# Patient Record
Sex: Male | Born: 1955 | Race: White | Hispanic: No
Health system: Midwestern US, Community
[De-identification: ages and names within clinical notes are randomized; demographics above are authoritative.]

## PROBLEM LIST (undated history)

## (undated) DIAGNOSIS — M1990 Unspecified osteoarthritis, unspecified site: Secondary | ICD-10-CM

## (undated) DIAGNOSIS — F909 Attention-deficit hyperactivity disorder, unspecified type: Secondary | ICD-10-CM

## (undated) DIAGNOSIS — M069 Rheumatoid arthritis, unspecified: Secondary | ICD-10-CM

## (undated) DIAGNOSIS — M545 Low back pain, unspecified: Secondary | ICD-10-CM

## (undated) DIAGNOSIS — G8929 Other chronic pain: Secondary | ICD-10-CM

## (undated) DIAGNOSIS — H9190 Unspecified hearing loss, unspecified ear: Secondary | ICD-10-CM

## (undated) DIAGNOSIS — I10 Essential (primary) hypertension: Secondary | ICD-10-CM

## (undated) DIAGNOSIS — M12811 Other specific arthropathies, not elsewhere classified, right shoulder: Secondary | ICD-10-CM

## (undated) DIAGNOSIS — M797 Fibromyalgia: Secondary | ICD-10-CM

## (undated) DIAGNOSIS — K219 Gastro-esophageal reflux disease without esophagitis: Secondary | ICD-10-CM

## (undated) DIAGNOSIS — M75101 Unspecified rotator cuff tear or rupture of right shoulder, not specified as traumatic: Secondary | ICD-10-CM

## (undated) DIAGNOSIS — R519 Headache, unspecified: Secondary | ICD-10-CM

## (undated) DIAGNOSIS — M542 Cervicalgia: Secondary | ICD-10-CM

## (undated) DIAGNOSIS — Z8719 Personal history of other diseases of the digestive system: Secondary | ICD-10-CM

## (undated) DIAGNOSIS — G542 Cervical root disorders, not elsewhere classified: Secondary | ICD-10-CM

## (undated) DIAGNOSIS — R51 Headache: Secondary | ICD-10-CM

## (undated) DIAGNOSIS — H9319 Tinnitus, unspecified ear: Secondary | ICD-10-CM

## (undated) DIAGNOSIS — M75102 Unspecified rotator cuff tear or rupture of left shoulder, not specified as traumatic: Secondary | ICD-10-CM

## (undated) DIAGNOSIS — F03C18 Unspecified dementia, severe, with other behavioral disturbance (HCC): Principal | ICD-10-CM

## (undated) DIAGNOSIS — F1010 Alcohol abuse, uncomplicated: Principal | ICD-10-CM

## (undated) DIAGNOSIS — G3184 Mild cognitive impairment, so stated: Principal | ICD-10-CM

## (undated) DIAGNOSIS — I959 Hypotension, unspecified: Secondary | ICD-10-CM

## (undated) DIAGNOSIS — Z7689 Persons encountering health services in other specified circumstances: Secondary | ICD-10-CM

## (undated) HISTORY — PX: COLONOSCOPY: SHX174

## (undated) HISTORY — PX: CARPAL TUNNEL RELEASE: SHX101

## (undated) HISTORY — PX: DUPUYTREN CONTRACTURE RELEASE: SHX1478

## (undated) HISTORY — PX: CARDIAC CATHETERIZATION: SHX172

## (undated) HISTORY — DX: Unspecified hearing loss, unspecified ear: H91.90

## (undated) HISTORY — PX: ESOPHAGOGASTRODUODENOSCOPY: SHX1529

## (undated) HISTORY — PX: SHOULDER SURGERY: SHX246

## (undated) HISTORY — DX: Unspecified osteoarthritis, unspecified site: M19.90

## (undated) HISTORY — DX: Tinnitus, unspecified ear: H93.19

## (undated) HISTORY — DX: Cervicalgia: M54.2

## (undated) HISTORY — PX: CERVICAL SPINE SURGERY: SHX589

## (undated) HISTORY — DX: Fibromyalgia: M79.7

## (undated) HISTORY — DX: Low back pain, unspecified: M54.50

---
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
## 2000-11-26 ENCOUNTER — Encounter (HOSPITAL_COMMUNITY): Admission: RE | Admit: 2000-11-26 | Discharge: 2000-12-26 | Payer: Self-pay | Admitting: Rheumatology

## 2001-01-07 ENCOUNTER — Encounter (HOSPITAL_COMMUNITY): Admission: RE | Admit: 2001-01-07 | Discharge: 2001-02-06 | Payer: Self-pay | Admitting: Oncology

## 2001-03-04 ENCOUNTER — Encounter (HOSPITAL_COMMUNITY): Admission: RE | Admit: 2001-03-04 | Discharge: 2001-04-03 | Payer: Self-pay | Admitting: Rheumatology

## 2001-09-09 ENCOUNTER — Encounter (HOSPITAL_COMMUNITY): Admission: RE | Admit: 2001-09-09 | Discharge: 2001-10-09 | Payer: Self-pay | Admitting: Rheumatology

## 2002-02-10 ENCOUNTER — Encounter (HOSPITAL_COMMUNITY): Admission: RE | Admit: 2002-02-10 | Discharge: 2002-03-12 | Payer: Self-pay | Admitting: Rheumatology

## 2002-06-02 ENCOUNTER — Encounter (HOSPITAL_COMMUNITY): Admission: RE | Admit: 2002-06-02 | Discharge: 2002-07-02 | Payer: Self-pay | Admitting: Rheumatology

## 2006-06-04 ENCOUNTER — Encounter (INDEPENDENT_AMBULATORY_CARE_PROVIDER_SITE_OTHER): Payer: Self-pay | Admitting: Specialist

## 2006-06-04 ENCOUNTER — Ambulatory Visit (HOSPITAL_BASED_OUTPATIENT_CLINIC_OR_DEPARTMENT_OTHER): Admission: RE | Admit: 2006-06-04 | Discharge: 2006-06-04 | Payer: Self-pay | Admitting: Orthopedic Surgery

## 2008-06-15 ENCOUNTER — Encounter (INDEPENDENT_AMBULATORY_CARE_PROVIDER_SITE_OTHER): Payer: Self-pay | Admitting: Orthopedic Surgery

## 2008-06-15 ENCOUNTER — Ambulatory Visit (HOSPITAL_BASED_OUTPATIENT_CLINIC_OR_DEPARTMENT_OTHER): Admission: RE | Admit: 2008-06-15 | Discharge: 2008-06-15 | Payer: Self-pay | Admitting: Orthopedic Surgery

## 2010-09-02 LAB — POCT HEMOGLOBIN-HEMACUE: Hemoglobin: 14.6 g/dL (ref 13.0–17.0)

## 2010-10-01 NOTE — Op Note (Signed)
NAMELAAKEA, [name]                 ACCOUNT NO.:  [id number]   MEDICAL RECORD NO.:  [id number]          PATIENT TYPE:  AMB   LOCATION:  DSC                          FACILITY:  [facility]   PHYSICIAN:  Katy Fitch. Sypher, M.D. DATE OF BIRTH:  1956-04-26   DATE OF PROCEDURE:  06/15/2008  DATE OF DISCHARGE:                               OPERATIVE REPORT   PREOPERATIVE DIAGNOSES:  1. Complex severe Dupuytren contracture right palm, right long finger,      right ring finger, and right small finger with extensive metacarpal      phalangeal joint flexion contractures and nodular disease involving      the long ring and ring small web spaces.  2. Right carpal tunnel syndrome.   POSTOPERATIVE DIAGNOSES:  1. Complex severe Dupuytren contracture right palm, right long finger,      right ring finger, and right small finger with extensive metacarpal      phalangeal joint flexion contractures and nodular disease involving      the long ring and ring small web spaces.  2. Right carpal tunnel syndrome.   OPERATION:  1. Resection of complex Dupuytren contracture right palm and small      finger.  2. Resection of Dupuytren contracture right palm, ring finger.  3. Resection of Dupuytren contracture right palm and long finger.  4. Separate incision subfascial right carpal tunnel release.   SURGEON:  Josephine Igo, MD   ASSISTANT:  Annye Rusk, PA-C   ANESTHESIA:  General by LMA.   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  He is status post  reconstructive surgery to the left hand with a good long-term result.  He has background osteoarthrosis.  He developed a progressive Dupuytren  contracture of his right hand limiting his ability to open his palm.  He  also had numbness in the index, long and part of the ring finger.  Electrodiagnostic studies confirmed carpal tunnel  syndrome.   Due to a failure to respond to nonoperative measures, he is brought to  the operating room at this time for resection of his pathologic fascia  and release of the transverse carpal ligament.   Preoperatively, he was advised as to the nature of Dupuytren contracture  and palmar fibromatosis.  He knows this is a relentless process that  will recur in the operated fingers as well as unoperated fingers.  Over  time, he will likely have more contractures more nodules, and more  difficulty.   We will perform a subtotaled fasciectomy and try to prophylactically  remove structures that are transforming at this time.   Preoperatively, questions were invited and answered in detail.  He also  was provided informed consent for release of the transverse carpal  ligament.   PROCEDURE:  [name] is brought to the operating room and placed in  supine position upon the operating table.  Preoperatively, he was  interviewed by Dr. Gypsy Balsam who placed an infraclavicular block for  perioperative anesthesia.  He was brought to room 8, placed in supine  position upon operating table and under Dr. Burnett Corrente direct supervision,  general endotracheal anesthesia induced.  Vancomycin 1 g was  administered as an IV prophylactic antibiotic followed by retained  Betadine scrub and paint of the right upper extremity.  The arm was  exsanguinated with an Esmarch bandage and an arterial tourniquet on the  proximal brachium inflated to 220 mmHg.  The procedure commenced with  planning of extensive Brunner zigzag incisions extending to a carpal  tunnel incision paralleling the thenar crease.  The incisions were taken  with 90 degree angles and island flaps were created at the web spaces  between the long ring and ring small web.  The skin flaps were elevated  sharply with a scalpel dissection.  The areas of pinning were released  with scissor and scalpel dissection.  A meticulous identification of the   superficial palmar arch, all of the common digital vessels, common  digital nerves and proper digital nerves to the long, ring, and small  finger was accomplished followed by meticulous resection of the pre-  tendinous fibers, lateral fascial sheets, natatory ligaments, central  cord and one retrovascular cord in the ring finger.   After all the pathologic fascia was excised, the MP joints could be  hyperextended 15 degrees and the interphalangeal joints fully extended.  With great care, the ulnar artery was identified followed by sounding of  the carpal canal.  The median nerve was noted to be in a very ulnar  position and there was an aberrant thenar muscle that extended into the  hypothenar eminence.  With great care, the thenar muscles were  dissected.  An atypical motor branch piercing the ligament was  identified and a retrograde branch was also noted proximally.  With  great care, the transverse carpal ligament was released.  The nerve was  clearly compressed at the distal margin of the transverse carpal  ligament.  The fascial bands overlying the nerve deep to the superficial  palmar arch were released.   The wound was then irrigated with sterile saline.  The zigzag flaps were  lengthened with V-Y advancement technique followed by repair of the skin  wounds with intradermal 3-0 Prolene at the site of the transverse carpal  ligament incision and corner sutures of 5-0 nylon and multiple  interrupted sutures of 5-0 nylon closing the skin wounds.  Open palm  technique was used and loose closure of the proximal phalangeal segments  of the fingers was accomplished.   The wound was then dressed with Silvadene, Adaptic, sterile gauze,  sterile Webril and a volar plaster splint maintaining the wrist in 5  degrees of dorsiflexion.   There were no apparent complications.  [name] tolerated the surgery  and anesthesia well.  The tourniquet was released.  There was immediate  cap refill  to the fingers and thumb.   For aftercare, he is provided prescriptions for Dilaudid 2mg  q 4 hours  prn pain 30 tablets,  he has Percocet 5 mg 1 to 2 tablets p.o. q.4-6 h.  p.r.n. at home and doxycycline 100 mg one p.o. b.i.d. x5 days as a  prophylactic antibiotic.   He will elevate his hand for 1 week.  I will see him back for followup  in the office in 5-7 days for dressing change.      Katy Fitch Sypher, M.D.  Electronically Signed     RVS/MEDQ  D:  06/15/2008  T:  06/16/2008  Job:  [id number]

## 2010-10-04 NOTE — Consult Note (Signed)
[name], Whitaker NO.:  [id number]   MEDICAL RECORD NO.:  [id number]                   PATIENT TYPE:   LOCATION:                                       FACILITY:   PHYSICIAN:  Aundra Dubin, M.D.            DATE OF BIRTH:   DATE OF CONSULTATION:  DATE OF DISCHARGE:                                   CONSULTATION   CHIEF COMPLAINT:  Fibromyalgia.   HISTORY OF PRESENT ILLNESS:  The patient returns being in a flare.  He has  read parts of three books on fibromyalgia and has two pages of lists of  symptoms that he wants to discuss.  He is hurting everywhere.  He feels  depressed.  He is not sleeping well.  He has headaches.  He is aching across  his low back.  There is numbness and tingling in joints.  He mentions fibro-  fog.  There were numerous other symptoms.  His weight is up 4 pounds.  There are no swollen joints.  There are no fever, rashes.   MEDICATIONS:  1. Ritalin 10 mg t.i.d.  2. Valium 5 mg t.i.d.  3. Vicodin 5/500 daily.  4. Darvocet three daily.  5. Ambien rare.  6. Benadryl 50 mg h.s.  7. Vitamins.   PHYSICAL EXAMINATION:  VITAL SIGNS:  Weight 175 pounds, blood pressure  120/82, respirations 14.  GENERAL:  No distress.  LUNGS:  Clear.  HEART:  Regular.  No murmur.  NECK:  No adenopathy.  EXTREMITIES:  Lower extremities:  No edema.  MUSCULOSKELETAL:  The hands and wrists have no swelling or warmth.  They are  mildly tender.  Elbows, shoulders move with stiffness.  Trigger points  around the shoulder, neck.  Some tenderness but there is no wincing.  Hips,  knees, ankles, and feet have a good range of motion, show no arthritis.   ASSESSMENT/PLAN:  Fibromyalgia.  I also believe he is having a flare because  of depression and insomnia.  He does not use the Ambien each night and I  have asked him to use it regularly with the Benadryl.  He has seen Dr.  Donell Beers in the past and I need his expertise to help direct medicines  that  may be needed for depression and possibly anxiety.  The patient mentioned  that his father has been quite ill during December and there were other  family stressors.  I will continue the Darvocet and Vicodin as above.  He is  not overusing this or abusing it in any way.   He will return in four months.   Time:  40 minutes.  Aundra Dubin, M.D.    WWT/MEDQ  D:  06/02/2002  T:  06/02/2002  Job:  [id number]   cc:   Romeo Apple, M.D.   Archer Asa, M.D.

## 2010-10-04 NOTE — Op Note (Signed)
NAMEHANNAN, [name]                 ACCOUNT NO.:  [id number]   MEDICAL RECORD NO.:  [id number]          PATIENT TYPE:  AMB   LOCATION:  DSC                          FACILITY:  [facility]   PHYSICIAN:  Katy Fitch. Sypher, M.D. DATE OF BIRTH:  1956-02-24   DATE OF PROCEDURE:  06/04/2006  DATE OF DISCHARGE:                               OPERATIVE REPORT   PREOPERATIVE DIAGNOSES:  1. Complex recurrent Dupuytren's contracture, left hand, involving      palm to long, ring and small fingers, with extensive nodular      disease in the small and ring fingers.  2. Left carpal tunnel syndrome.   POSTOPERATIVE DIAGNOSES:  1. Complex recurrent Dupuytren's contracture, left hand, involving      palm to long, ring and small fingers, with extensive nodular      disease in the small and ring fingers.  2. Left carpal tunnel syndrome.   OPERATION:  1. Resection of complex recurrent Dupuytren's contracture, left palm      and small finger.  2. Resection of complex recurrent Dupuytren's contracture, left palm      and ring finger.  3. Resection of Dupuytren's contracture, left palm pretendinous fibers      to long finger.  4. Through separate subfascial incision, release of left transverse      carpal ligament.   OPERATIONS:  Katy Fitch. Sypher, M.D.   ASSISTANT:  Molly Maduro Dasnoit PA-C.   ANESTHESIA:  General by LMA, supplemented by a left infraclavicular  block, supervising anesthesiologist is Dr. Sampson Goon.   INDICATIONS:  [name] is a 55 year old self-employed [employer]or[employer] who  was referred by his relative [name], D.D.S., for evaluation and  management of a complex recurrent left Dupuytren's contracture and left  hand numbness.   [name] had had surgery to his left hand in [facility] a year  prior and had immediate recurrence of a severe complex Dupuytren's  contracture.   He sought a hand surgery consult.   Clinical examination revealed extensive nodular recurrent disease  involving the pretendinous fibers of the palm to the long, ring and  small fingers and extensive nodular disease in the ring and small  fingers extending to the middle phalangeal segments.  In addition, Mr.  Whitaker had hand numbness and was referred to see Dr. Johna Roles for  electrodiagnostic studies, which confirmed left carpal tunnel syndrome.   After a lengthy informed consent, he is brought to the operating room at  this time.   Preoperatively, [name] was carefully advised that we could not cure  Dupuytren's disease with surgery.  We can palliate his contractures by  removal of the pathologic fascia.  In addition, he knows that he is at  greater risk for neurovascular injury due to the redo nature of the  surgery with scar from the prior surgeon's efforts.   After lengthy informed consents during which questions were invited and  answered with [name] and his family, he is brought to the operating  room at this time.   Dr. Sampson Goon placed an infraclavicular block in the holding area  leading satisfactory anesthesia of the left upper extremity.   PROCEDURE:  [name] was brought to the operating room and placed in  supine position on the operating table.  Due to some issues with  tourniquet comfort, Dr. Sampson Goon, ultimately decided to proceed with  general anesthesia by LMA.   The left arm was prepped with Betadine soap and solution and sterilely  draped.  Vancomycin 500 mg was administered as an IV prophylactic  antibiotic due to a PENICILLIN allergy.   The procedure commenced with planning of extensor Brunner zigzag  incisions with a [employer] between the ring and small fingers.  The  skin flaps were taken sharply down to the level of the transverse carpal  ligament and elevated between the dermis and the pathologic fascia.  A  meticulous dissection for 1 hour 57 minutes under tourniquet ischemia  was completed with removal of all of the pathologic fascia in the  palm  including the pretendinous fibers of the long, ring and small fingers,  and removal of extensive postoperative scar in the line of the ring and  small fingers and involvement of extensive lateral fascial sheet disease  on the ulnar aspect of the small finger, natatory ligament disease  between the ring and small and long and ring fingers, a large fascial  sheet on the radial lateral aspect of the ring finger, extensive central  core disease in the retrovascular cord on the ulnar aspect of the small  finger, a spiral band and a retrovascular band in the ring finger, and a  large central cord in the ring finger.   After removal of all the pathologic-appearing fascia, the flexion  contracture of the long, ring and small finger MP joints was fully  relieved and the PIP flexion contractures were likewise relieved in the  ring and small fingers.   Care was taken to meticulously dissect out the named vascular and  neurologic structures.  Several cutaneous branches were sacrificed due  to the severe subdermal involvement of the disease.   After completion of the Dupuytren's resection, we attended to the carpal  tunnel by creating a separate fascial incision proximal to the  superficial palmar arch, followed by identification of the transverse  carpal ligament and subcutaneous release of the transverse carpal  ligament into the distal forearm.   This widely opened the carpal canal.  No masses or other predicaments  were noted.  Bleeding points along the margin of the released ligament  were electrocauterized with bipolar current, followed by release of the  tourniquet.  The wounds were then placed under compression with cold  saline sponges for 10 minutes, followed by meticulous hemostasis.  The  skin flaps were then advanced with V-Y advancement flap technique to lengthen the skin of the small finger and palm.  The skin edges were  inset with corner sutures of 5-0 nylon and multiple  horizontal and  mattress and simple sutures of 5-0 nylon.   [name] tolerated the surgery and anesthesia well.  He was  subsequently awakened from general anesthesia and transferred to the  recovery room with stable vital signs.   For aftercare he is provided Dilaudid 2 mg one or two tablets p.o. q4.-  6h. p.r.n. pain, 30 tablets without refill; also doxycycline 100 mg one  p.o. b.i.d. x5 days as a prophylactic antibiotic.      Katy Fitch Sypher, M.D.  Electronically Signed     RVS/MEDQ  D:  06/04/2006  T:  06/04/2006  Job:  [id number]   cc:   [name], D.D.S.

## 2010-10-04 NOTE — Consult Note (Signed)
[facility]  Patient:    [name], [name] Visit Number: [id number] MRN: [id number]          Service Type: RHE Location: [facility] Attending Physician:  Aundra Dubin Dictated by:   Aundra Dubin, M.D. Proc. Date: 03/04/01 Admit Date:  03/04/2001   CC:         Mliss Fritz, M.D.             Archer Asa, M.D.                          Consultation Report  CHIEF COMPLAINT:  Pain.  HISTORY:  Mr. Ransome returns with his wife.  He has found that the Effexor has helped a slight degree reduce some of the overall pain.  He feels that he has a little bit better attitude.  Discusses in his usual fashion in great detail about the various areas of discomfort he has had.  He has also experienced that with riding his motorcycle he did not feel comfortable because his mind was not focused on this.  He limited his riding.  He continues to hurt all over.  There has been some numbness and coolness to his index and third right fingers.  His weight is stable and unchanged.  There has been no rashes.  He uses Metamucil fairly frequently to help with constipation.  He does have headaches occasionally.  MEDICATIONS:  1. Effexor 75 mg q.d.  2. Ritalin 10 mg t.i.d.  3. Valium 5 mg t.i.d.-q.i.d. p.r.n.  4. Darvocet-N 100 t.i.d.  5. Benadryl 50 mg q.h.s.  6. Aspirin 81 mg q.d.  7. Metamucil.  8. Pentanolol drops q.d.  9. Allergy shots. 10. Vicodin rare.  PHYSICAL EXAMINATION  VITAL SIGNS:  Weight 174 pounds, blood pressure 144/90, respirations 16.  GENERAL:  He appears well and unchanged from prior visits.  SKIN:  Clear.  LUNGS:  Clear.  HEART:  Regular.  No murmur.  MUSCULOSKELETAL:  The hands, wrists, elbows, shoulders, and neck:  Good range of motion with minor neck stiffness.  Trigger points at the elbow, shoulder, around the neck, and upper paraspinous muscles were moderately tender with some moderate wincing.  Low back was particularly tender  with wincing.  Hips, knees, ankles, and feet had a painless full range of motion and showed no synovitis.  NEUROLOGIC:  Strength 5/5.  DTRs 2+ throughout.  Negative SLRs.  ASSESSMENT AND PLAN:  Polyarthralgia.  As I have discussed with Mr. and Mrs. Winsett today, I am not finding focal abnormalities.  I believe he is on a moderately significant number of medicines, some of which act in opposite ways.  Specifically, he is on Ritalin and Valium.  Mrs. Pulis says that this combination at one point "saved our marriage."  I do not feel versed in changing these medicines.  We also discussed that he has significant elements of a fibromyalgia type pain syndrome.  I have reviewed the symptomatology of this.  He brings information that he has gathered from the internet on this also.  I have discussed with him that I believe we need the help of a psychiatrist to help further adjust his medicines and understand what is going on with him. I am continuing him on the Darvocet at a t.i.d. dose and the Effexor 75 mg q.d.  Dr. Donell Beers may feel that all these medicines are entirely appropriate, or may decide to adjust these medicines.  I have expressed my  concerns that he has had some mental clouding and greatly encourage him to not ride his motorcycle if this is occurring.  I will see him back in three months. Dictated by:   Aundra Dubin, M.D. Attending Physician:  Aundra Dubin DD:  03/04/01 TD:  03/04/01 Job: [id number]

## 2010-10-04 NOTE — Consult Note (Signed)
NAMEPRESTEN, JOOST                             ACCOUNT NO.:  [id number]   MEDICAL RECORD NO.:  [id number]                   PATIENT TYPE:   LOCATION:                                       FACILITY:   PHYSICIAN:  Aundra Dubin, M.D.            DATE OF BIRTH:   DATE OF CONSULTATION:  02/10/2002  DATE OF DISCHARGE:                                   CONSULTATION   CHIEF COMPLAINT:  Back pain.   HISTORY OF PRESENT ILLNESS:  Mr. Ditullio returns with his wife, reporting that  he has had ups and downs through this summer.  He finds that he will worsen  with rainy, damp weather.  He has had no swollen joints.  He has had some  contractures to the right palmar hand.  He has had a spell in August that he  was aching in his anterior shins.  This sounds like shin splints.  He iced  these areas and has gradually improved.  His weight is down seven pounds.  He is trying to lose some weight and is paying attention to his diet.  He  has had some headaches.  His sleep is generally on fair.   MEDICATIONS:  1. Darvocet t.i.d.  2. Ritalin 10 mg t.i.d.  3. Valium 5 mg t.i.d.  4. Benadryl 50 mg rare.  5. Aspirin 81 mg q.d.  6. Multivitamin q.d.  7. Allergy shots q. week.  8. Ambien 10 mg h.s.   PHYSICAL EXAMINATION:  VITAL SIGNS:  Weight 171 pounds, blood pressure  124/80, respirations 18.  GENERAL:  No distress.  LUNGS:  Clear.  HEART:  Regular, no murmur.  NECK:  Negative JVD.  MUSCULOSKELETAL:  The hands have a vague arthritic appearance.  He appears  to have some Dupuytren's contractures forming to the fourth tendon area of  the right hand.  The hands were nontender.  Wrists, elbows, and shoulders  all had a good range of motion and there was no resistance.  Trigger points  around the shoulder, neck, occiput, and anterior chest were mildly tender.  Knees, ankles, and feet all had a good range of motion and there was no  guarding with any of this movement.  Palpation along the shins  was  nontender.   ASSESSMENT/PLAN:  1. Back and shoulder pain.  Overall, Mr. Mazer is stable.  He does discuss     that he feels that he is doing as well as he has been in a long time.  He     will continue with the t.i.d. Darvocet.  He tells me he has one refill     left on the Vicodin and is using a small amount of this medicine.  He     will continue with his other medicines to include Valium, Ritalin, and     Ambien at night.  2. He will return in four months.  Time 25 minutes.                                                 Aundra Dubin, M.D.    WWT/MEDQ  D:  02/10/2002  T:  02/11/2002  Job:  [id number]   cc:   Kinnie Scales Plomaritis

## 2010-10-04 NOTE — Consult Note (Signed)
[facility]  Patient:    [name], [name] Visit Number: [id number] MRN: [id number]          Service Type: RHE Location: [facility] Attending Physician:  Aundra Dubin Dictated by:   Nathaneil Canary, M.D. Proc. Date: 09/09/01 Admit Date:  09/09/2001                            Consultation Report  CHIEF COMPLAINT:  Back pain, shoulder pain.  HISTORY:  Mr. ______ did see Dr. Donell Beers in consultation on June 27, 2001. Dr. Donell Beers felt that the combination of the Ritalin and Valium was okay.  He did suggest possibly using nortriptyline.  Mr. ______ says today that he is intolerant to this medicine.  He has had surgery to the right shoulder in January 2003 with Dr. ______ and is feeling significantly better.  He says he is feeling as well as he has in many years.  MEDICATIONS: 1. Vioxx 25 mg q.d. 2. Ritalin 10 mg t.i.d. 3. Valium 5 mg t.i.d. 4. Darvocet one t.i.d. 5. Benadryl 50 mg h.s. p.rn.. 6. ASA 81 mg q.d. 7. Metamucil q.d. 8. Vicodin about one q.d.  PHYSICAL EXAMINATION  VITAL SIGNS:  Weight 178 pounds, blood pressure 116/78, respirations 16.  GENERAL:  No distress.  MUSCULOSKELETAL:  Hands and wrists are slightly tender but they are nonswollen and have a good range of motion.  Elbows, shoulders:  Good range of motion, especially the right shoulder.  It was mildly stiff.  Trigger points at the elbows, shoulders, neck occiput, upper paraspinous muscles, anterior chest was met with slight complaint of discomfort, but no wincing or grimacing.  There was no guarding with moving to a lying and sitting position.  Hips, knees, ankles, feet had a good range of motion.  He had negative SLRs.  ASSESSMENT AND PLAN:  Back and shoulder pain.  He is actually significantly improved with the right shoulder at this time.  However, he still complains that his low back, upper back, and shoulders are the worse areas.  This is probably not a fibromyalgia syndrome,  but more of a pain disorder.  I have consented to allowing him to use three Darvocet q.d. and one Vicodin q.d.  I have asked him to not obtain any narcotics from any other physician.  I have discussed the difficulties with treating pain with narcotics and my preference is to have a pain center do this.  However, we will try this at this point.  He will return in four months. Dictated by:   Nathaneil Canary, M.D. Attending Physician:  Aundra Dubin DD:  09/09/01 TD:  09/09/01 Job: [id number]

## 2011-05-20 DIAGNOSIS — M75102 Unspecified rotator cuff tear or rupture of left shoulder, not specified as traumatic: Secondary | ICD-10-CM

## 2011-05-20 HISTORY — DX: Unspecified rotator cuff tear or rupture of left shoulder, not specified as traumatic: M75.102

## 2011-06-05 ENCOUNTER — Other Ambulatory Visit: Payer: Self-pay | Admitting: Orthopedic Surgery

## 2011-06-05 ENCOUNTER — Encounter (HOSPITAL_BASED_OUTPATIENT_CLINIC_OR_DEPARTMENT_OTHER): Payer: Self-pay | Admitting: *Deleted

## 2011-06-05 NOTE — Pre-Procedure Instructions (Signed)
States has nicks/cuts both hands

## 2011-06-10 ENCOUNTER — Ambulatory Visit (HOSPITAL_BASED_OUTPATIENT_CLINIC_OR_DEPARTMENT_OTHER)
Admission: RE | Admit: 2011-06-10 | Discharge: 2011-06-11 | Disposition: A | Discharge status: Home / Self Care | Payer: BC Managed Care – PPO | Source: Ambulatory Visit | Attending: Orthopedic Surgery | Admitting: Orthopedic Surgery

## 2011-06-10 ENCOUNTER — Encounter (HOSPITAL_BASED_OUTPATIENT_CLINIC_OR_DEPARTMENT_OTHER): Payer: Self-pay | Admitting: *Deleted

## 2011-06-10 ENCOUNTER — Encounter (HOSPITAL_BASED_OUTPATIENT_CLINIC_OR_DEPARTMENT_OTHER)
Admission: RE | Disposition: A | Discharge status: Home / Self Care | Payer: Self-pay | Source: Ambulatory Visit | Attending: Orthopedic Surgery

## 2011-06-10 ENCOUNTER — Encounter (HOSPITAL_BASED_OUTPATIENT_CLINIC_OR_DEPARTMENT_OTHER): Payer: Self-pay | Admitting: Certified Registered"

## 2011-06-10 ENCOUNTER — Ambulatory Visit (HOSPITAL_BASED_OUTPATIENT_CLINIC_OR_DEPARTMENT_OTHER): Payer: BC Managed Care – PPO | Admitting: Certified Registered"

## 2011-06-10 DIAGNOSIS — Z5333 Arthroscopic surgical procedure converted to open procedure: Secondary | ICD-10-CM | POA: Insufficient documentation

## 2011-06-10 DIAGNOSIS — M67919 Unspecified disorder of synovium and tendon, unspecified shoulder: Secondary | ICD-10-CM | POA: Insufficient documentation

## 2011-06-10 DIAGNOSIS — M24119 Other articular cartilage disorders, unspecified shoulder: Secondary | ICD-10-CM | POA: Insufficient documentation

## 2011-06-10 DIAGNOSIS — M19019 Primary osteoarthritis, unspecified shoulder: Secondary | ICD-10-CM | POA: Insufficient documentation

## 2011-06-10 DIAGNOSIS — M069 Rheumatoid arthritis, unspecified: Secondary | ICD-10-CM | POA: Insufficient documentation

## 2011-06-10 DIAGNOSIS — M719 Bursopathy, unspecified: Secondary | ICD-10-CM | POA: Insufficient documentation

## 2011-06-10 DIAGNOSIS — F909 Attention-deficit hyperactivity disorder, unspecified type: Secondary | ICD-10-CM | POA: Insufficient documentation

## 2011-06-10 DIAGNOSIS — K219 Gastro-esophageal reflux disease without esophagitis: Secondary | ICD-10-CM | POA: Insufficient documentation

## 2011-06-10 HISTORY — DX: Low back pain, unspecified: M54.50

## 2011-06-10 HISTORY — DX: Attention-deficit hyperactivity disorder, unspecified type: F90.9

## 2011-06-10 HISTORY — DX: Low back pain: M54.5

## 2011-06-10 HISTORY — DX: Rheumatoid arthritis, unspecified: M06.9

## 2011-06-10 HISTORY — DX: Gastro-esophageal reflux disease without esophagitis: K21.9

## 2011-06-10 HISTORY — DX: Other chronic pain: G89.29

## 2011-06-10 HISTORY — DX: Unspecified rotator cuff tear or rupture of left shoulder, not specified as traumatic: M75.102

## 2011-06-10 HISTORY — DX: Cervical root disorders, not elsewhere classified: G54.2

## 2011-06-10 HISTORY — DX: Unspecified osteoarthritis, unspecified site: M19.90

## 2011-06-10 SURGERY — ARTHROSCOPY, SHOULDER, WITH ROTATOR CUFF REPAIR
Anesthesia: Regional | Site: Shoulder | Laterality: Left | Wound class: Clean

## 2011-06-10 MED ORDER — HYDROMORPHONE HCL 2 MG PO TABS: ORAL_TABLET | ORAL | Status: AC

## 2011-06-10 MED ORDER — LIDOCAINE HCL (CARDIAC) 20 MG/ML IV SOLN
INTRAVENOUS | Status: DC | PRN
  Administered 2011-06-10: 80 mg via INTRAVENOUS

## 2011-06-10 MED ORDER — IBUPROFEN 600 MG PO TABS: 600.0000 mg | ORAL_TABLET | Freq: Four times a day (QID) | ORAL | Status: AC | PRN

## 2011-06-10 MED ORDER — SUCCINYLCHOLINE CHLORIDE 20 MG/ML IJ SOLN
INTRAMUSCULAR | Status: DC | PRN
  Administered 2011-06-10: 120 mg via INTRAVENOUS

## 2011-06-10 MED ORDER — LACTATED RINGERS IV SOLN
INTRAVENOUS | Status: DC
  Administered 2011-06-10: 100 mL/h via INTRAVENOUS
  Administered 2011-06-10 (×2): via INTRAVENOUS

## 2011-06-10 MED ORDER — METOCLOPRAMIDE HCL 5 MG PO TABS: 5.0000 mg | ORAL_TABLET | Freq: Three times a day (TID) | ORAL | Status: DC | PRN

## 2011-06-10 MED ORDER — FENTANYL CITRATE 0.05 MG/ML IJ SOLN
50.0000 ug | INTRAMUSCULAR | Status: DC | PRN
  Administered 2011-06-10: 100 ug via INTRAVENOUS

## 2011-06-10 MED ORDER — SODIUM CHLORIDE 0.9 % IR SOLN
Status: DC | PRN
  Administered 2011-06-10: 7000 mL

## 2011-06-10 MED ORDER — OXYCODONE-ACETAMINOPHEN 5-325 MG PO TABS
1.0000 | ORAL_TABLET | ORAL | Status: DC | PRN
  Administered 2011-06-11: 2 via ORAL

## 2011-06-10 MED ORDER — ONDANSETRON HCL 4 MG/2ML IJ SOLN: 4.0000 mg | Freq: Four times a day (QID) | INTRAMUSCULAR | Status: DC | PRN

## 2011-06-10 MED ORDER — DOXYCYCLINE HYCLATE 100 MG PO TABS: 100.0000 mg | ORAL_TABLET | Freq: Two times a day (BID) | ORAL | Status: AC

## 2011-06-10 MED ORDER — DEXTROSE-NACL 5-0.45 % IV SOLN
INTRAVENOUS | Status: DC
  Administered 2011-06-10: 17:00:00 via INTRAVENOUS

## 2011-06-10 MED ORDER — PROPOFOL 10 MG/ML IV EMUL
INTRAVENOUS | Status: DC | PRN
  Administered 2011-06-10: 200 mg via INTRAVENOUS

## 2011-06-10 MED ORDER — HYDROMORPHONE HCL PF 1 MG/ML IJ SOLN
0.2500 mg | INTRAMUSCULAR | Status: DC | PRN
  Administered 2011-06-10 (×2): 0.5 mg via INTRAVENOUS

## 2011-06-10 MED ORDER — HYDROMORPHONE HCL 2 MG PO TABS
2.0000 mg | ORAL_TABLET | ORAL | Status: DC | PRN
  Administered 2011-06-10: 4 mg via ORAL
  Administered 2011-06-10 – 2011-06-11 (×3): 2 mg via ORAL

## 2011-06-10 MED ORDER — VANCOMYCIN HCL IN DEXTROSE 1-5 GM/200ML-% IV SOLN
1000.0000 mg | Freq: Once | INTRAVENOUS | Status: AC
  Administered 2011-06-11: 1000 mg via INTRAVENOUS

## 2011-06-10 MED ORDER — ROPIVACAINE HCL 5 MG/ML IJ SOLN
INTRAMUSCULAR | Status: DC | PRN
  Administered 2011-06-10: 30 mL via EPIDURAL

## 2011-06-10 MED ORDER — CHLORHEXIDINE GLUCONATE 4 % EX LIQD: 60.0000 mL | Freq: Once | CUTANEOUS | Status: DC

## 2011-06-10 MED ORDER — VANCOMYCIN HCL IN DEXTROSE 1-5 GM/200ML-% IV SOLN
1000.0000 mg | Freq: Once | INTRAVENOUS | Status: AC
  Administered 2011-06-10: 1000 mg via INTRAVENOUS

## 2011-06-10 MED ORDER — METOCLOPRAMIDE HCL 5 MG/ML IJ SOLN: 5.0000 mg | Freq: Three times a day (TID) | INTRAMUSCULAR | Status: DC | PRN

## 2011-06-10 MED ORDER — FENTANYL CITRATE 0.05 MG/ML IJ SOLN
INTRAMUSCULAR | Status: DC | PRN
  Administered 2011-06-10 (×2): 25 ug via INTRAVENOUS
  Administered 2011-06-10: 50 ug via INTRAVENOUS
  Administered 2011-06-10: 25 ug via INTRAVENOUS
  Administered 2011-06-10: 50 ug via INTRAVENOUS
  Administered 2011-06-10: 25 ug via INTRAVENOUS

## 2011-06-10 MED ORDER — DEXAMETHASONE SODIUM PHOSPHATE 4 MG/ML IJ SOLN
INTRAMUSCULAR | Status: DC | PRN
  Administered 2011-06-10: 10 mg via INTRAVENOUS

## 2011-06-10 MED ORDER — ONDANSETRON HCL 4 MG/2ML IJ SOLN
INTRAMUSCULAR | Status: DC | PRN
  Administered 2011-06-10: 4 mg via INTRAVENOUS

## 2011-06-10 MED ORDER — HYDROMORPHONE HCL PF 1 MG/ML IJ SOLN
0.5000 mg | INTRAMUSCULAR | Status: DC | PRN
  Administered 2011-06-10 (×2): 0.5 mg via INTRAVENOUS
  Administered 2011-06-11: 1 mg via INTRAVENOUS

## 2011-06-10 MED ORDER — MIDAZOLAM HCL 2 MG/2ML IJ SOLN
1.0000 mg | INTRAMUSCULAR | Status: DC | PRN
  Administered 2011-06-10: 2 mg via INTRAVENOUS

## 2011-06-10 SURGICAL SUPPLY — 87 items
ANCH SUT SWLK 19.1 CLS EYLT TL (Anchor) ×1 IMPLANT
ANCH SUT SWLK 19.1 CLS EYLT VT (Anchor) ×1 IMPLANT
ANCH SUT SWLK 19.1X4.75 (Anchor) ×4 IMPLANT
ANCHOR BIO SWLOCK 4.75 W/TIG (Anchor) ×1 IMPLANT
ANCHOR SUT BIO SW 4.75 W/FIB (Anchor) ×1 IMPLANT
ANCHOR SUT BIO SW 4.75X19.1 (Anchor) ×4 IMPLANT
BANDAGE ADHESIVE 1X3 (GAUZE/BANDAGES/DRESSINGS) IMPLANT
BLADE AVERAGE 25X9 (BLADE) IMPLANT
BLADE CUTTER MENIS 5.5 (BLADE) ×1 IMPLANT
BLADE SURG 15 STRL LF DISP TIS (BLADE) ×2 IMPLANT
BLADE SURG 15 STRL SS (BLADE) ×4
BLADE VORTEX 6.0 (BLADE) ×1 IMPLANT
BUR EGG/OVAL CARBIDE (BURR) ×1 IMPLANT
BUR OVAL 6.0 (BURR) ×1 IMPLANT
CANISTER OMNI JUG 16 LITER (MISCELLANEOUS) ×2 IMPLANT
CANISTER SUCTION 2500CC (MISCELLANEOUS) ×1 IMPLANT
CANNULA 5.75X7 CRYSTAL CLEAR (CANNULA) IMPLANT
CANNULA SHOULDER 7CM (CANNULA) IMPLANT
CANNULA TWIST IN 8.25X7CM (CANNULA) ×1 IMPLANT
CLEANER CAUTERY TIP 5X5 PAD (MISCELLANEOUS) IMPLANT
CLOTH BEACON ORANGE TIMEOUT ST (SAFETY) ×2 IMPLANT
CUTTER MENISCUS  4.2MM (BLADE) ×1
CUTTER MENISCUS 4.2MM (BLADE) ×1 IMPLANT
DECANTER SPIKE VIAL GLASS SM (MISCELLANEOUS) IMPLANT
DRAPE INCISE IOBAN 66X45 STRL (DRAPES) ×2 IMPLANT
DRAPE STERI 35X30 U-POUCH (DRAPES) ×2 IMPLANT
DRAPE SURG 17X23 STRL (DRAPES) ×1 IMPLANT
DRAPE U-SHAPE 47X51 STRL (DRAPES) ×2 IMPLANT
DRAPE U-SHAPE 76X120 STRL (DRAPES) ×4 IMPLANT
DRSG PAD ABDOMINAL 8X10 ST (GAUZE/BANDAGES/DRESSINGS) ×2 IMPLANT
DURAPREP 26ML APPLICATOR (WOUND CARE) ×2 IMPLANT
ELECT REM PT RETURN 9FT ADLT (ELECTROSURGICAL) ×2
ELECTRODE REM PT RTRN 9FT ADLT (ELECTROSURGICAL) IMPLANT
GLOVE BIO SURGEON STRL SZ 6.5 (GLOVE) ×2 IMPLANT
GLOVE BIOGEL M STRL SZ7.5 (GLOVE) ×2 IMPLANT
GLOVE BIOGEL PI IND STRL 7.0 (GLOVE) IMPLANT
GLOVE BIOGEL PI IND STRL 8 (GLOVE) ×2 IMPLANT
GLOVE BIOGEL PI INDICATOR 7.0 (GLOVE) ×2
GLOVE BIOGEL PI INDICATOR 8 (GLOVE) ×2
GLOVE ORTHO TXT STRL SZ7.5 (GLOVE) ×2 IMPLANT
GOWN BRE IMP PREV XXLGXLNG (GOWN DISPOSABLE) ×1 IMPLANT
GOWN PREVENTION PLUS XLARGE (GOWN DISPOSABLE) ×3 IMPLANT
GOWN STRL REIN 2XL XLG LVL4 (GOWN DISPOSABLE) ×1 IMPLANT
NDL SCORPION (NEEDLE) ×1 IMPLANT
NDL SUT 6 .5 CRC .975X.05 MAYO (NEEDLE) IMPLANT
NEEDLE MAYO TAPER (NEEDLE)
NEEDLE MINI RC 24MM (NEEDLE) IMPLANT
NEEDLE SCORPION (NEEDLE) ×2 IMPLANT
PACK ARTHROSCOPY DSU (CUSTOM PROCEDURE TRAY) ×2 IMPLANT
PACK BASIN DAY SURGERY FS (CUSTOM PROCEDURE TRAY) ×2 IMPLANT
PAD CLEANER CAUTERY TIP 5X5 (MISCELLANEOUS)
PASSER SUT SWANSON 36MM LOOP (INSTRUMENTS) IMPLANT
PENCIL BUTTON HOLSTER BLD 10FT (ELECTRODE) ×1 IMPLANT
SLEEVE SCD COMPRESS KNEE MED (MISCELLANEOUS) ×2 IMPLANT
SLING ARM FOAM STRAP LRG (SOFTGOODS) ×1 IMPLANT
SLING ARM FOAM STRAP MED (SOFTGOODS) IMPLANT
SPONGE GAUZE 4X4 12PLY (GAUZE/BANDAGES/DRESSINGS) ×2 IMPLANT
SPONGE LAP 4X18 X RAY DECT (DISPOSABLE) ×2 IMPLANT
STRIP CLOSURE SKIN 1/2X4 (GAUZE/BANDAGES/DRESSINGS) ×3 IMPLANT
SUCTION FRAZIER TIP 10 FR DISP (SUCTIONS) IMPLANT
SUT ETHIBOND 2 OS 4 DA (SUTURE) IMPLANT
SUT ETHILON 4 0 PS 2 18 (SUTURE) IMPLANT
SUT FIBERWIRE #2 38 T-5 BLUE (SUTURE) ×2
SUT FIBERWIRE 3-0 18 TAPR NDL (SUTURE)
SUT PROLENE 1 CT (SUTURE) IMPLANT
SUT PROLENE 3 0 PS 2 (SUTURE) ×2 IMPLANT
SUT TIGER TAPE 7 IN WHITE (SUTURE) IMPLANT
SUT VIC AB 0 CT1 27 (SUTURE)
SUT VIC AB 0 CT1 27XBRD ANBCTR (SUTURE) IMPLANT
SUT VIC AB 0 SH 27 (SUTURE) ×1 IMPLANT
SUT VIC AB 2-0 SH 27 (SUTURE) ×2
SUT VIC AB 2-0 SH 27XBRD (SUTURE) IMPLANT
SUT VIC AB 3-0 SH 27 (SUTURE)
SUT VIC AB 3-0 SH 27X BRD (SUTURE) IMPLANT
SUT VIC AB 3-0 X1 27 (SUTURE) IMPLANT
SUTURE FIBERWR #2 38 T-5 BLUE (SUTURE) IMPLANT
SUTURE FIBERWR 3-0 18 TAPR NDL (SUTURE) IMPLANT
SYR 3ML 23GX1 SAFETY (SYRINGE) IMPLANT
SYR BULB 3OZ (MISCELLANEOUS) IMPLANT
TAPE FIBER 2MM 7IN #2 BLUE (SUTURE) ×1 IMPLANT
TAPE PAPER 3X10 WHT MICROPORE (GAUZE/BANDAGES/DRESSINGS) ×2 IMPLANT
TOWEL OR 17X24 6PK STRL BLUE (TOWEL DISPOSABLE) ×2 IMPLANT
TUBE CONNECTING 20X1/4 (TUBING) ×4 IMPLANT
TUBING ARTHROSCOPY IRRIG 16FT (MISCELLANEOUS) ×2 IMPLANT
WAND STAR VAC 90 (SURGICAL WAND) ×2 IMPLANT
WATER STERILE IRR 1000ML POUR (IV SOLUTION) ×2 IMPLANT
YANKAUER SUCT BULB TIP NO VENT (SUCTIONS) IMPLANT

## 2011-06-10 NOTE — Anesthesia Procedure Notes (Addendum)
Anesthesia Regional Block:  Interscalene brachial plexus block  Pre-Anesthetic Checklist: ,, timeout performed, Correct Patient, Correct Site, Correct Laterality, Correct Procedure, Correct Position, site marked, Risks and benefits discussed,  Surgical consent,  Pre-op evaluation,  At surgeon's request and post-op pain management  Laterality: Left  Prep: chloraprep       Needles:  Injection technique: Single-shot  Needle Type: Echogenic Stimulator Needle     Needle Length: 5cm 5 cm Needle Gauge: 22 and 22 G    Additional Needles:  Procedures: ultrasound guided and nerve stimulator Interscalene brachial plexus block  Nerve Stimulator or Paresthesia:  Response: biceps flexion, 0.45 mA,   Additional Responses:   Narrative:  Start time: 06/10/2011 11:50 AM End time: 06/10/2011 12:00 PM Injection made incrementally with aspirations every 5 mL.  Performed by: Personally  Anesthesiologist: Dr Chaney Malling  Additional Notes: Functioning IV was confirmed and monitors were applied.  A 50mm 22ga Arrow echogenic stimulator needle was used. Sterile prep and drape,hand hygiene and sterile gloves were used.  Negative aspiration and negative test dose prior to incremental administration of local anesthetic. The patient tolerated the procedure well.  Ultrasound guidance: relevent anatomy identified, needle position confirmed, local anesthetic spread visualized around nerve(s), vascular puncture avoided.  Image printed for medical record.   Interscalene brachial plexus block Procedure Name: Intubation Date/Time: 06/10/2011 12:53 PM Performed by: Radford Pax Pre-anesthesia Checklist: Patient identified, Emergency Drugs available, Suction available, Patient being monitored and Timeout performed Patient Re-evaluated:Patient Re-evaluated prior to inductionOxygen Delivery Method: Circle System Utilized Preoxygenation: Pre-oxygenation with 100% oxygen Intubation Type: IV  induction Ventilation: Mask ventilation without difficulty Laryngoscope Size: 3 and Miller Grade View: Grade I Tube type: Oral Tube size: 8.0 mm Number of attempts: 1 (atraumatic) Airway Equipment and Method: stylet and oral airway Placement Confirmation: ETT inserted through vocal cords under direct vision,  positive ETCO2 and breath sounds checked- equal and bilateral Secured at: 24 (at the lips) cm Tube secured with: Tape (pink tape used) Dental Injury: Teeth and Oropharynx as per pre-operative assessment     Anesthesia Regional Block:   Narrative:

## 2011-06-10 NOTE — Anesthesia Postprocedure Evaluation (Signed)
Anesthesia Post Note  Patient: [name]  Procedure(s) Performed:  SHOULDER ARTHROSCOPY WITH ROTATOR CUFF REPAIR - subacromial decompression, distal clavicle resection, open rotator cuff repair, and biceps tenodesis  Anesthesia type: General  Patient location: PACU  Post pain: Pain level controlled and Adequate analgesia  Post assessment: Post-op Vital signs reviewed, Patient's Cardiovascular Status Stable, Respiratory Function Stable, Patent Airway and Pain level controlled  Last Vitals:  Filed Vitals:   06/10/11 1530  BP: 163/98  Pulse: 94  Temp:   Resp: 17    Post vital signs: Reviewed and stable  Level of consciousness: awake, alert  and oriented  Complications: No apparent anesthesia complications

## 2011-06-10 NOTE — H&P (Signed)
[name] is an 56 y.o. male.   Chief Complaint: LEFT SHOULDER PAIN HPI: Patient is a 56 year old right-hand-dominant male who we have followed for a number of years for a variety of orthopedic problems. We began evaluating his left shoulder for a chronic pain which she relates he's had for about 4-5 years. He previously had undergone right shoulder rotator cuff reconstruction possibly 6 years ago by a physician in [facility] patient was evaluated in our office in December of 2012 at that time he had pain with forward flexion abduction and external rotation. We obtained an MRI of the shoulder which revealed marked thinning of the distal supraspinatus tendon especially anteriorly and was thought to represent a full thickness partial tear and also mild degenerative a.c. arthropathy. The patient has been having problems sleeping at night 7 out of 7 nights. He is having difficulty performing his ADLs and his job functions. He wishes to proceed with surgical intervention.  Past Medical History  Diagnosis Date  . GERD (gastroesophageal reflux disease)     TUMS as needed  . Arthritis     neck, back  . Rheumatoid arthritis     hands  . Rotator cuff tear, left 05/2011  . Pinched cervical nerve root   . Chronic lower back pain   . ADHD (attention deficit hyperactivity disorder)     Past Surgical History  Procedure Date  . Dupuytren contracture release 06/04/2006(left); 06/15/2008(right)    left hand x 2; right hand x 1  . Carpal tunnel release 06/04/2006; 06/15/2008    left; right  . Shoulder surgery     right    History reviewed. No pertinent family history. Social History:  reports that he has never smoked. He has never used smokeless tobacco. He reports that he drinks alcohol. He reports that he does not use illicit drugs.  Allergies:  Allergies  Allergen Reactions  . Penicillins Other (See Comments)    Unknown - as a child    No current facility-administered medications on  file as of .   Medications Prior to Admission  Medication Sig Dispense Refill  . aspirin 81 MG tablet Take 160 mg by mouth daily.      . celecoxib (CELEBREX) 200 MG capsule Take 200 mg by mouth daily.      . diazepam (VALIUM) 5 MG tablet Take 5 mg by mouth every 6 (six) hours as needed.      . diphenhydrAMINE (SOMINEX) 25 MG tablet Take 25 mg by mouth at bedtime as needed.      . fish oil-omega-3 fatty acids 1000 MG capsule Take 2 g by mouth daily.      . methylphenidate (RITALIN) 5 MG tablet Take 5 mg by mouth 3 (three) times daily.      . Multiple Vitamin (MULTIVITAMIN) tablet Take 1 tablet by mouth daily.      Marland Kitchen oxyCODONE-acetaminophen (PERCOCET) 10-325 MG per tablet Take 1 tablet by mouth every 4 (four) hours as needed.      . pregabalin (LYRICA) 50 MG capsule Take 50 mg by mouth Nightly.      . tizanidine (ZANAFLEX) 6 MG capsule Take 6 mg by mouth Nightly.        No results found for this or any previous visit (from the past 48 hour(s)).  No results found.   Pertinent items are noted in HPI.  Height 5\' 9"  (1.753 m), weight 80.287 kg (177 lb).  General appearance: alert Head: Normocephalic, without obvious abnormality  Neck: supple, symmetrical, trachea midline Resp: clear to auscultation bilaterally Cardio: regular rate and rhythm, S1, S2 normal, no murmur, click, rub or gallop GI: normal findings: bowel sounds normal Extremities: Left shoulder reveals painful abduction,scaption and external rotation. He has a painful arc from 100-120 of elevation. He has a painful Hawkins maneuver. He has weakness of abduction, external rotation and crepitation all consistent with rotator cuff arthropathy/tear. Pulses: 2+ and symmetric Skin: normal Neurologic: Grossly normal    Assessment/Plan Impression: Left shoulder impingement with a.c. arthropathy and rotator cuff tear.  Plan: Patient to be taken to the operating room to undergo left shoulder arthroscopy with subacromial  decompression distal clavicle resection and repair of the rotator cuff as needed. The procedure risks benefits and postoperative course were discussed with the patient at length and he was in agreement with this plan.  DASNOIT,Dent Plantz J 06/10/2011, 9:16 AM   H&P documentation: 06/10/2011  -History and Physical Reviewed  -Patient has been re-examined  -No change in the plan of care  Wyn Forster, MD

## 2011-06-10 NOTE — Brief Op Note (Signed)
06/10/2011  2:56 PM  PATIENT:  [name]  56 y.o. male  PRE-OPERATIVE DIAGNOSIS:  left shoulder rotator cuff tear, unfavorable acromial anatomy  POST-OPERATIVE DIAGNOSIS:  left shoulder rotator cuff tear and type III SLAP lesion  PROCEDURE:  Procedure(s): SHOULDER ARTHROSCOPY WITH ROTATOR CUFF REPAIR,, BICEPS TENODESIS, DEBRIDE SLAP LESION AND SUB ACROMIAL DECOMPRESSION.  SURGEON:  Surgeon(s): Wyn Forster., MD  PHYSICIAN ASSISTANT:   ASSISTANTS: Mallory Shirk.A-C   ANESTHESIA:   general  EBL:  Total I/O In: 1400 [I.V.:1400] Out: -   BLOOD ADMINISTERED:none  DRAINS: none   LOCAL MEDICATIONS USED:  OTHER PRE OP PLEXUS BLOCK  SPECIMEN:  No Specimen  DISPOSITION OF SPECIMEN:  N/A  COUNTS:  YES  TOURNIQUET:  * No tourniquets in log *  DICTATION: .Other Dictation: Dictation Number [id number]  PLAN OF CARE: Admit for overnight observation  PATIENT DISPOSITION:  PACU - hemodynamically stable.   Delay start of Pharmacological VTE agent (>24hrs) due to surgical blood loss or risk of bleeding:  YES

## 2011-06-10 NOTE — Op Note (Signed)
OP NOTE DICTATED:  06/10/11  [id number]

## 2011-06-10 NOTE — Progress Notes (Signed)
Assisted Dr. Chaney Malling with left, ultrasound guided interscalene block. Side rails up, monitors on throughout procedure. See vital signs in flow sheet. Tolerated Procedure well.

## 2011-06-10 NOTE — Anesthesia Preprocedure Evaluation (Signed)
Anesthesia Evaluation  Patient identified by MRN, date of birth, ID band Patient awake    Reviewed: Allergy & Precautions, H&P , NPO status , Patient's Chart, lab work & pertinent test results  Airway Mallampati: II  Neck ROM: full    Dental   Pulmonary          Cardiovascular     Neuro/Psych  Neuromuscular disease    GI/Hepatic GERD-  ,  Endo/Other    Renal/GU      Musculoskeletal  (+) Arthritis -, Rheumatoid disorders,    Abdominal   Peds  Hematology   Anesthesia Other Findings   Reproductive/Obstetrics                           Anesthesia Physical Anesthesia Plan  ASA: II  Anesthesia Plan: General and Regional   Post-op Pain Management: MAC Combined w/ Regional for Post-op pain   Induction: Intravenous  Airway Management Planned: Oral ETT  Additional Equipment:   Intra-op Plan:   Post-operative Plan:   Informed Consent: I have reviewed the patients History and Physical, chart, labs and discussed the procedure including the risks, benefits and alternatives for the proposed anesthesia with the patient or authorized representative who has indicated his/her understanding and acceptance.     Plan Discussed with: CRNA and Surgeon  Anesthesia Plan Comments:         Anesthesia Quick Evaluation

## 2011-06-10 NOTE — Transfer of Care (Signed)
Immediate Anesthesia Transfer of Care Note  Patient: [name]  Procedure(s) Performed:  SHOULDER ARTHROSCOPY WITH ROTATOR CUFF REPAIR - subacromial decompression, distal clavicle resection, open rotator cuff repair, and biceps tenodesis  Patient Location: PACU  Anesthesia Type: GA combined with regional for post-op pain  Level of Consciousness: awake, alert , oriented and patient cooperative  Airway & Oxygen Therapy: Patient Spontanous Breathing and Patient connected to face mask oxygen  Post-op Assessment: Report given to PACU RN and Post -op Vital signs reviewed and stable  Post vital signs: Reviewed and stable  Complications: No apparent anesthesia complications

## 2011-06-11 NOTE — Op Note (Signed)
NAMEANTUANE, EASTRIDGE                 ACCOUNT NO.:  [id number]  MEDICAL RECORD NO.:  [id number]  LOCATION:                                 FACILITY:  PHYSICIAN:  Katy Fitch. Wynee Matarazzo, M.D.      DATE OF BIRTH:  DATE OF PROCEDURE:  06/10/2011 DATE OF DISCHARGE:                              OPERATIVE REPORT   PREOPERATIVE DIAGNOSES:  MRI documented rotator cuff tear involving supraspinatus and infraspinatus tendons, and unfavorable acromioclavicular anatomy with degenerative change at the acromioclavicular joint.  POSTOPERATIVE DIAGNOSES:  PITA type midsubstance tear of infraspinatus and full-thickness supraspinatus rotator cuff tear, left shoulder with unfavorable acromial anatomy and degenerative arthritis of acromioclavicular joint without impinging osteophytes; also type 3 superior labrum anterior posterior lesion and significant chronic bursitis.  OPERATION: 1. Diagnostic arthroscopy, left glenohumeral joint with debridement of     synovitis and identification of subscapularis rotator cuff tear. 2. Arthroscopic subacromial decompression with bursectomy and     arthroscopic evaluation of acromioclavicular joint. 3. Attempted arthroscopic repair of subscapularis followed by open     hybrid repair of subscapularis, supraspinatus, infraspinatus,     rotator cuff tendons. 4. Biceps tenodesis.  SURGEON:  Katy Fitch. Annaliesa Blann, MD  ASSISTANT:  Marveen Reeks Dasnoit, PA-C  ANESTHESIA:  General by endotracheal technique supplemented by a left plexus block.  SUPERVISING ANESTHESIOLOGIST:  Achille Rich, MD  INDICATIONS:  Ann Held is a 56 year old gentleman well acquainted with our practice aftercare for bilateral Dupuytren's contractures.  He has had a prior right rotator cuff reconstruction by Dr. Arletha Grippe in the [place] and had increasing left shoulder pain.  Clinical examination suggested that Mr. Schuff had a left rotator cuff tear.  He had weakness of scaption, forward flexion, and  external rotation.  His plain films demonstrated unfavorable AC anatomy with degenerative changes; however, we did not have a large projecting inferior osteophyte.  He was sent for an MRI of the shoulder at [facility] [place] where the MRI documented a full-thickness rotator cuff tear involving supraspinatus and extensive intratendinous delamination of the infraspinatus.  He had a very unfavorable contour of the anterior acromion.  We advised Mr. Mcnall to proceed with diagnostic arthroscopy followed by repair of all pathologies identified.  Preoperatively, he was reminded of the potential risks and benefits of surgery including the obvious risks of potential infection, failure to relieve all of his pain, possible need for repeat reconstructive surgery should his tear not biologically hold up.  He was advised that we will provide perioperative antibiotics in form of vancomycin due to penicillin allergy.  He understands that we will provide appropriate analgesic medication perioperative and postoperative.  Questions were invited and answered in detail in the office as well as in the holding area.  PROCEDURE:  Joal Eakle was brought to room 1 of the [facility] and placed in supine position on the operating table. Preoperatively, he was interviewed by Dr. Chaney Malling, who recommended a perioperative scalene block.  This was placed with ultrasound control by Dr. Chaney Malling without complication leading to have satisfactory anesthesia of left upper extremity.  In room 1 under Dr. Seward Meth direct supervision, general endotracheal anesthesia  was induced followed by careful positioning in the beach- chair position with the aid of a torso and head holder designed for shoulder arthroscopy.  Examination of the left shoulder under anesthesia revealed combined elevation of 180, external rotation 95, internal rotation 80, and no signs of adhesive capsulitis.   There was no sign of glenohumeral instability.  The entire left upper extremity and forequarter were prepped with DuraPrep and draped with impervious arthroscopy drapes.  The shoulder was instrumented with a blunt trocar through a standard posterior viewing portal.  Diagnostic arthroscopy revealed a type 3 SLAP lesion with instability at the biceps origin.  There is a full-thickness tear of the supraspinatus and a grade 2 tear of the subscapularis and extensive degenerative change of the infraspinatus.  After intra-articular debridement, we created an anterolateral portal and began an arthroscopic repair of the subscapularis.  The lesser tuberosity was decorticated to bleeding bone and a clear cannula was used with a scorpion to place a fiber tape through the superior portion of the subscapularis.  We subsequently experienced an interesting technical predicament where Mr. Manner had such a "short shoulder" i.e. The dimension between his head and neck and shoulder was so tight that we could not achieve a satisfactory angle to reconstruct the subscapularis through the standard anterior portal and choosing not to repair it through the anterior lateral portal.  I ultimately decided that we would repair the subscapularis at the time of the open biceps tenodesis and rotator cuff repair.  The FiberTape was left through the anterior portal followed by further subacromial decompression.  We inspected the [facility] joint and found that it was not causing impingement and did not have the violated capsule, therefore it was left intact.  After further bursectomy, we discontinued the arthroscopic portion of the procedure and proceeded to perform a 3-cm anterior middle-third deltoid splitting incision.  After bursectomy was accomplished, the tear was evident.  We exposed the long head of the biceps, subsequently secured the long head of the biceps with a Krackow suture of number 2 FiberWire.  The greater  tuberosity was decorticated from the lesser tuberosity across the biceps groove to the posterior aspect of the infraspinatus followed by placement of two 4.75 mm PEEK SwiveLock's with the double-tail FiberTape.  The FiberTape's and the central suture were then passed with a scorpion suture passers into the rotator cuff in such manner to recreate an anatomic footprint of the supraspinatus and infraspinatus.  The medial footprint sutures were tied with standard mattress sutures after reducing the tendon to an anatomic footprint followed by use of a double-diamondback repair compressing the repair to the decorticated greater tuberosity.  The subscapularis suture was incorporated into the middle SwiveLock placed laterally to create a satisfactory lateralization of the subscapularis and an anatomic repair of this tendon.  After repair was completed, we had an excellent profile.  A hand rasp was used to gently bevel the margin of the acromion.  Once again, I palpated the coracoacromial ligament.  The coracoid and the [facility] joint capsule did not find any evidence of an impinging osteophyte.  After hemostasis was achieved, the scope was replaced within the joint and the stump of the biceps and unstable portions of type 3 SLAP lesion were debrided.  Hemostasis was achieved followed by thorough irrigation of the joint and subacromial space.  The deltoid split was then repaired with figure-of-eight sutures of 0 Vicryl followed by repair of the skin with subcutaneous 2-0 Vicryl and intradermal 3-0 Prolene.  The portals were repaired with 3-0 Prolene.  For aftercare, Mr. Vivas will be admitted to recovery care center for observation of vital signs.  We anticipate vancomycin 1 g IV in the morning prior to discharge.  He will be given p.o. and oral Dilaudid for pain.     Katy Fitch Maximiliano Cromartie, M.D.     RVS/MEDQ  D:  06/10/2011  T:  06/11/2011  Job:  [id number]  cc:   Fanny Dance, M.D.

## 2017-05-22 ENCOUNTER — Other Ambulatory Visit: Payer: Self-pay | Admitting: Orthopedic Surgery

## 2017-06-25 ENCOUNTER — Other Ambulatory Visit: Payer: Self-pay | Admitting: Orthopedic Surgery

## 2017-06-26 NOTE — Pre-Procedure Instructions (Signed)
Sabra Heck.  06/26/2017      [facility] [phone] [place], [place] - [address] [place] [address] Phone: [phone] Fax: [phone]    Your procedure is scheduled on February 19  Report to [facility] Admitting at 0800 A.M.  Call this number if you have problems the morning of surgery:  [phone]   Remember:  Do not eat food or drink liquids after midnight.  Continue all medications as directed by your physician except follow these medication instructions before surgery below   Take these medicines the morning of surgery with A SIP OF WATER  Eye drops diazepam (VALIUM hydrOXYzine (VISTARIL)  Oxycodone  7 days prior to surgery STOP taking any Aspirin(unless otherwise instructed by your surgeon), Aleve, Naproxen, Ibuprofen, Motrin, Advil, Goody's, BC's, all herbal medications, fish oil, and all vitamins    Do not wear jewelry  Do not wear lotions, powders, or cologne, or deodorant.  Men may shave face and neck.  Do not bring valuables to the hospital.  [facility] is not responsible for any belongings or valuables.  Contacts, dentures or bridgework may not be worn into surgery.  Leave your suitcase in the car.  After surgery it may be brought to your room.  For patients admitted to the hospital, discharge time will be determined by your treatment team.  Patients discharged the day of surgery will not be allowed to drive home.    Special instructions:   [facility]- Preparing For Surgery  Before surgery, you can play an important role. Because skin is not sterile, your skin needs to be as free of germs as possible. You can reduce the number of germs on your skin by washing with CHG (chlorahexidine gluconate) Soap before surgery.  CHG is an antiseptic cleaner which kills germs and bonds with the skin to continue killing germs even after washing.  Please do not use if you have an allergy to CHG or antibacterial soaps. If your  skin becomes reddened/irritated stop using the CHG.  Do not shave (including legs and underarms) for at least 48 hours prior to first CHG shower. It is OK to shave your face.  Please follow these instructions carefully.   1. Shower the NIGHT BEFORE SURGERY and the MORNING OF SURGERY with CHG.   2. If you chose to wash your hair, wash your hair first as usual with your normal shampoo.  3. After you shampoo, rinse your hair and body thoroughly to remove the shampoo.  4. Use CHG as you would any other liquid soap. You can apply CHG directly to the skin and wash gently with a scrungie or a clean washcloth.   5. Apply the CHG Soap to your body ONLY FROM THE NECK DOWN.  Do not use on open wounds or open sores. Avoid contact with your eyes, ears, mouth and genitals (private parts). Wash Face and genitals (private parts)  with your normal soap.  6. Wash thoroughly, paying special attention to the area where your surgery will be performed.  7. Thoroughly rinse your body with warm water from the neck down.  8. DO NOT shower/wash with your normal soap after using and rinsing off the CHG Soap.  9. Pat yourself dry with a CLEAN TOWEL.  10. Wear CLEAN PAJAMAS to bed the night before surgery, wear comfortable clothes the morning of surgery  11. Place CLEAN SHEETS on your bed the night of your first shower and DO NOT  SLEEP WITH PETS.    Day of Surgery: Do not apply any deodorants/lotions. Please wear clean clothes to the hospital/surgery center.      Please read over the following fact sheets that you were given.

## 2017-06-29 ENCOUNTER — Encounter (HOSPITAL_COMMUNITY): Payer: Self-pay

## 2017-06-29 ENCOUNTER — Other Ambulatory Visit: Payer: Self-pay

## 2017-06-29 ENCOUNTER — Encounter (HOSPITAL_COMMUNITY)
Admission: RE | Admit: 2017-06-29 | Discharge: 2017-06-29 | Disposition: A | Discharge status: Home / Self Care | Payer: Medicare HMO | Source: Ambulatory Visit | Attending: Orthopedic Surgery | Admitting: Orthopedic Surgery

## 2017-06-29 ENCOUNTER — Encounter (HOSPITAL_COMMUNITY): Payer: Self-pay | Admitting: Vascular Surgery

## 2017-06-29 DIAGNOSIS — Z01812 Encounter for preprocedural laboratory examination: Secondary | ICD-10-CM | POA: Diagnosis present

## 2017-06-29 DIAGNOSIS — M1990 Unspecified osteoarthritis, unspecified site: Secondary | ICD-10-CM | POA: Diagnosis not present

## 2017-06-29 DIAGNOSIS — I10 Essential (primary) hypertension: Secondary | ICD-10-CM | POA: Insufficient documentation

## 2017-06-29 DIAGNOSIS — F909 Attention-deficit hyperactivity disorder, unspecified type: Secondary | ICD-10-CM | POA: Diagnosis not present

## 2017-06-29 DIAGNOSIS — Z79899 Other long term (current) drug therapy: Secondary | ICD-10-CM | POA: Diagnosis not present

## 2017-06-29 DIAGNOSIS — Z87891 Personal history of nicotine dependence: Secondary | ICD-10-CM | POA: Diagnosis not present

## 2017-06-29 HISTORY — DX: Headache, unspecified: R51.9

## 2017-06-29 HISTORY — DX: Personal history of other diseases of the digestive system: Z87.19

## 2017-06-29 HISTORY — DX: Essential (primary) hypertension: I10

## 2017-06-29 HISTORY — DX: Headache: R51

## 2017-06-29 LAB — COMPREHENSIVE METABOLIC PANEL
ALK PHOS: 79 U/L (ref 38–126)
ALT: 24 U/L (ref 17–63)
AST: 19 U/L (ref 15–41)
Albumin: 4.4 g/dL (ref 3.5–5.0)
Anion gap: 11 (ref 5–15)
BILIRUBIN TOTAL: 0.8 mg/dL (ref 0.3–1.2)
BUN: 13 mg/dL (ref 6–20)
CALCIUM: 9.4 mg/dL (ref 8.9–10.3)
CHLORIDE: 102 mmol/L (ref 101–111)
CO2: 26 mmol/L (ref 22–32)
CREATININE: 1.12 mg/dL (ref 0.61–1.24)
GFR calc Af Amer: >60 mL/min (ref >60)
Glucose, Bld: 103 mg/dL — ABNORMAL HIGH (ref 65–99)
Potassium: 3.8 mmol/L (ref 3.5–5.1)
Sodium: 139 mmol/L (ref 135–145)
Total Protein: 7.4 g/dL (ref 6.5–8.1)

## 2017-06-29 LAB — SURGICAL PCR SCREEN
MRSA, PCR: NEGATIVE
STAPHYLOCOCCUS AUREUS: NEGATIVE

## 2017-06-29 LAB — CBC
HCT: 46.4 % (ref 39.0–52.0)
Hemoglobin: 16.3 g/dL (ref 13.0–17.0)
MCH: 33.2 pg (ref 26.0–34.0)
MCHC: 35.1 g/dL (ref 30.0–36.0)
MCV: 94.5 fL (ref 78.0–100.0)
PLATELETS: 285 10*3/uL (ref 150–400)
RBC: 4.91 MIL/uL (ref 4.22–5.81)
RDW: 13.1 % (ref 11.5–15.5)
WBC: 14.3 10*3/uL — AB (ref 4.0–10.5)

## 2017-06-29 NOTE — Progress Notes (Addendum)
PCP: Dr. Fanny Dance @ [facility] in [place], [place]  Cardiologist: none  Pain Management: Dr. Forestine Na @ [facility]in [place], [place]  Pt. Reported he had a heart cath 20-25 yrs.ago for chest pain. 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  Pt. Reports he has not had any chest pain since then.

## 2017-06-29 NOTE — Progress Notes (Signed)
Call to [facility]; spoke with Vernona Rieger in med records, upon requesting cardiac cath. From 20-25 yrs. Ago she spoke that there are not records available that far back. Call to A. Zelenak,PA-C to report the same.

## 2017-07-03 ENCOUNTER — Encounter (HOSPITAL_COMMUNITY): Payer: Self-pay

## 2017-07-03 NOTE — Progress Notes (Signed)
Anesthesia Chart Review:  Pt is a 62 year old male scheduled for right reverse total shoulder arthroplasty in 07/07/2017 with Teryl Lucy, MD  - PCP is Fanny Dance, MD who cleared pt for surgery.   PMH includes: HTN, ADHD, osteoarthritis.  Former smoker.  BMI 30.  S/p L shoulder arthroscopy 06/10/11  - Pt reported at pre-admission testing he had a cardiac cath 20-25 years ago that showed a "twig" blockage.  He reports no intervention, and has not seen a cardiologist since.  Attempts to obtain records unsuccessful; [facility] does not have access to records that old. PCP records do not list any cardiac history aside from HTN.   Medications include: Losartan  BP 134/77   Pulse 88   Temp 36.7 C (Oral)   Resp 18   Ht 5\' 9"  (1.753 m)   Wt 201 lb 1 oz (91.2 kg)   SpO2 99%   BMI 29.69 kg/m   Preoperative labs reviewed.    EKG was not obtained at pre-admission testing.  Will be obtained day of surgery.   Pt denied CP at pre-admission testing.  If EKG acceptable day of surgery, and no acute CV symptoms, I anticipate pt can proceed with surgery as scheduled.   , FNP-BC [facility] Short Stay Surgical Center/Anesthesiology Phone: [phone] 07/03/2017 4:18 PM

## 2017-07-07 ENCOUNTER — Encounter (HOSPITAL_COMMUNITY): Admission: RE | Payer: Self-pay | Source: Ambulatory Visit

## 2017-07-07 ENCOUNTER — Inpatient Hospital Stay (HOSPITAL_COMMUNITY): Admission: RE | Admit: 2017-07-07 | Payer: Medicare HMO | Source: Ambulatory Visit | Admitting: Orthopedic Surgery

## 2017-07-07 SURGERY — ARTHROPLASTY, SHOULDER, TOTAL
Anesthesia: Choice | Laterality: Right

## 2017-07-17 ENCOUNTER — Other Ambulatory Visit (HOSPITAL_COMMUNITY): Payer: Self-pay

## 2017-07-30 NOTE — Pre-Procedure Instructions (Signed)
[name].  07/30/2017      [facility] [phone] [place], [place] - [address] [place] [address] Phone: [phone] Fax: [phone]    Your procedure is scheduled on March 26  Report to [facility] Admitting at 1200 A.M.  Call this number if you have problems the morning of surgery:  [phone]   Remember:  Do not eat food or drink liquids after midnight.  Continue all medications as directed by your physician except follow these medication instructions before surgery below   Take these medicines the morning of surgery with A SIP OF WATER  eye drops if needed diazepam (VALIUM) Oxycodone  tiZANidine (ZANAFLEX)   7 days prior to surgery STOP taking any Aspirin(unless otherwise instructed by your surgeon), Aleve, Naproxen, Ibuprofen, Motrin, Advil, Goody's, BC's, all herbal medications, fish oil, and all vitamins   Do not wear jewelry  Do not wear lotions, powders, or cologne, or deodorant.  Men may shave face and neck.  Do not bring valuables to the hospital.  [facility] is not responsible for any belongings or valuables.  Contacts, dentures or bridgework may not be worn into surgery.  Leave your suitcase in the car.  After surgery it may be brought to your room.  For patients admitted to the hospital, discharge time will be determined by your treatment team.  Patients discharged the day of surgery will not be allowed to drive home.    Special instructions:   [facility]- Preparing For Surgery  Before surgery, you can play an important role. Because skin is not sterile, your skin needs to be as free of germs as possible. You can reduce the number of germs on your skin by washing with CHG (chlorahexidine gluconate) Soap before surgery.  CHG is an antiseptic cleaner which kills germs and bonds with the skin to continue killing germs even after washing.  Please do not use if you have an allergy to CHG or antibacterial soaps.  If your skin becomes reddened/irritated stop using the CHG.  Do not shave (including legs and underarms) for at least 48 hours prior to first CHG shower. It is OK to shave your face.  Please follow these instructions carefully.   1. Shower the NIGHT BEFORE SURGERY and the MORNING OF SURGERY with CHG.   2. If you chose to wash your hair, wash your hair first as usual with your normal shampoo.  3. After you shampoo, rinse your hair and body thoroughly to remove the shampoo.  4. Use CHG as you would any other liquid soap. You can apply CHG directly to the skin and wash gently with a scrungie or a clean washcloth.   5. Apply the CHG Soap to your body ONLY FROM THE NECK DOWN.  Do not use on open wounds or open sores. Avoid contact with your eyes, ears, mouth and genitals (private parts). Wash Face and genitals (private parts)  with your normal soap.  6. Wash thoroughly, paying special attention to the area where your surgery will be performed.  7. Thoroughly rinse your body with warm water from the neck down.  8. DO NOT shower/wash with your normal soap after using and rinsing off the CHG Soap.  9. Pat yourself dry with a CLEAN TOWEL.  10. Wear CLEAN PAJAMAS to bed the night before surgery, wear comfortable clothes the morning of surgery  11. Place CLEAN SHEETS on your bed the night of your first shower and  DO NOT SLEEP WITH PETS.    Day of Surgery: Do not apply any deodorants/lotions. Please wear clean clothes to the hospital/surgery center.      Please read over the following fact sheets that you were given.

## 2017-07-31 ENCOUNTER — Encounter (HOSPITAL_COMMUNITY): Payer: Self-pay

## 2017-07-31 ENCOUNTER — Other Ambulatory Visit: Payer: Self-pay

## 2017-07-31 ENCOUNTER — Encounter (HOSPITAL_COMMUNITY)
Admission: RE | Admit: 2017-07-31 | Discharge: 2017-07-31 | Disposition: A | Discharge status: Home / Self Care | Payer: Medicare HMO | Source: Ambulatory Visit | Attending: Orthopedic Surgery | Admitting: Orthopedic Surgery

## 2017-07-31 DIAGNOSIS — F909 Attention-deficit hyperactivity disorder, unspecified type: Secondary | ICD-10-CM | POA: Insufficient documentation

## 2017-07-31 DIAGNOSIS — Z01810 Encounter for preprocedural cardiovascular examination: Secondary | ICD-10-CM | POA: Insufficient documentation

## 2017-07-31 DIAGNOSIS — M1990 Unspecified osteoarthritis, unspecified site: Secondary | ICD-10-CM | POA: Diagnosis not present

## 2017-07-31 DIAGNOSIS — Z01812 Encounter for preprocedural laboratory examination: Secondary | ICD-10-CM | POA: Insufficient documentation

## 2017-07-31 DIAGNOSIS — I10 Essential (primary) hypertension: Secondary | ICD-10-CM | POA: Diagnosis not present

## 2017-07-31 DIAGNOSIS — Z87891 Personal history of nicotine dependence: Secondary | ICD-10-CM | POA: Insufficient documentation

## 2017-07-31 LAB — BASIC METABOLIC PANEL
Anion gap: 14 (ref 5–15)
BUN: 12 mg/dL (ref 6–20)
CALCIUM: 9.5 mg/dL (ref 8.9–10.3)
CO2: 24 mmol/L (ref 22–32)
Chloride: 100 mmol/L — ABNORMAL LOW (ref 101–111)
Creatinine, Ser: 1.01 mg/dL (ref 0.61–1.24)
GLUCOSE: 103 mg/dL — AB (ref 65–99)
Potassium: 3.4 mmol/L — ABNORMAL LOW (ref 3.5–5.1)
SODIUM: 138 mmol/L (ref 135–145)

## 2017-07-31 LAB — CBC
HCT: 46.5 % (ref 39.0–52.0)
Hemoglobin: 15.8 g/dL (ref 13.0–17.0)
MCH: 32.6 pg (ref 26.0–34.0)
MCHC: 34 g/dL (ref 30.0–36.0)
MCV: 96.1 fL (ref 78.0–100.0)
PLATELETS: 273 10*3/uL (ref 150–400)
RBC: 4.84 MIL/uL (ref 4.22–5.81)
RDW: 13.3 % (ref 11.5–15.5)
WBC: 10.6 10*3/uL — AB (ref 4.0–10.5)

## 2017-07-31 LAB — SURGICAL PCR SCREEN
MRSA, PCR: NEGATIVE
STAPHYLOCOCCUS AUREUS: NEGATIVE

## 2017-07-31 NOTE — Progress Notes (Signed)
PCP - Dr. Fanny Dance  Cardiologist - Denies  Chest x-ray - Denies  EKG - 07/31/17- Denies any prior readings  Stress Test - Denies  ECHO - Denies  Cardiac Cath - 20-25 yrs ago  Sleep Study - Denies CPAP - None  LABS- 07/31/17: CBC, BMP  Pt sts his previous surgery was cancelled due to unexpected dental work prior to surgery.   Anesthesia- Yes- Previous note/EKG  Pt denies having chest pain, sob, or fever at this time. All instructions explained to the pt, with a verbal understanding of the material. Pt agrees to go over the instructions while at home for a better understanding. The opportunity to ask questions was provided.

## 2017-08-03 NOTE — Progress Notes (Signed)
Anesthesia Chart Review:  Pt is a 62 year old male scheduled for R reverse shoulder arthroplasty on 08/11/2017 with Elijah Lucy, MD  Surgery was originally scheduled for 07/07/17 but was postponed for unexpected dental work.   - PCP is Elijah Dance, MD who cleared pt for surgery.   PMH includes: HTN, ADHD, osteoarthritis.  Former smoker.  BMI 30.  S/p L shoulder arthroscopy 06/10/11  - Pt reported at pre-admission testing he had a cardiac cath 20-25 years ago that showed a "twig" blockage.  He reports no intervention, and has not seen a cardiologist since.  Attempts to obtain records unsuccessful; [facility] not have access to records that old. PCP records do not list any cardiac history aside from HTN.   Medications include: Losartan  BP 138/81   Pulse 88   Temp 36.6 C (Oral)   Resp 20   Ht 5\' 9"  (1.753 m)   Wt 202 lb 3 oz (91.7 kg)   SpO2 97%   BMI 29.86 kg/m    Preoperative labs reviewed.    EKG 07/31/17: NSR. Cannot rule out Inferior infarct, age undetermined.  Appears stable when compared to EKG 06/15/08.   If no changes, I anticipate pt can proceed with surgery as scheduled.   06/17/08, FNP-BC [facility] Short Stay Surgical Center/Anesthesiology Phone: [phone] 08/03/2017 12:07 PM

## 2017-08-11 ENCOUNTER — Encounter (HOSPITAL_COMMUNITY)
Admission: RE | Disposition: A | Discharge status: Home / Self Care | Payer: Self-pay | Source: Ambulatory Visit | Attending: Orthopedic Surgery

## 2017-08-11 ENCOUNTER — Inpatient Hospital Stay (HOSPITAL_COMMUNITY): Payer: Medicare HMO | Admitting: Certified Registered"

## 2017-08-11 ENCOUNTER — Inpatient Hospital Stay (HOSPITAL_COMMUNITY)
Admission: RE | Admit: 2017-08-11 | Discharge: 2017-08-12 | DRG: 483 | Disposition: A | Discharge status: Home / Self Care | Payer: Medicare HMO | Source: Ambulatory Visit | Attending: Orthopedic Surgery | Admitting: Orthopedic Surgery

## 2017-08-11 ENCOUNTER — Encounter (HOSPITAL_COMMUNITY): Payer: Self-pay | Admitting: *Deleted

## 2017-08-11 ENCOUNTER — Inpatient Hospital Stay (HOSPITAL_COMMUNITY): Payer: Medicare HMO | Admitting: Emergency Medicine

## 2017-08-11 ENCOUNTER — Inpatient Hospital Stay (HOSPITAL_COMMUNITY): Payer: Medicare HMO

## 2017-08-11 DIAGNOSIS — M19019 Primary osteoarthritis, unspecified shoulder: Secondary | ICD-10-CM | POA: Diagnosis present

## 2017-08-11 DIAGNOSIS — F909 Attention-deficit hyperactivity disorder, unspecified type: Secondary | ICD-10-CM | POA: Diagnosis present

## 2017-08-11 DIAGNOSIS — K219 Gastro-esophageal reflux disease without esophagitis: Secondary | ICD-10-CM | POA: Diagnosis present

## 2017-08-11 DIAGNOSIS — M545 Low back pain: Secondary | ICD-10-CM | POA: Diagnosis present

## 2017-08-11 DIAGNOSIS — G8929 Other chronic pain: Secondary | ICD-10-CM | POA: Diagnosis present

## 2017-08-11 DIAGNOSIS — Z96619 Presence of unspecified artificial shoulder joint: Secondary | ICD-10-CM

## 2017-08-11 DIAGNOSIS — M75101 Unspecified rotator cuff tear or rupture of right shoulder, not specified as traumatic: Secondary | ICD-10-CM | POA: Diagnosis present

## 2017-08-11 DIAGNOSIS — M19011 Primary osteoarthritis, right shoulder: Principal | ICD-10-CM | POA: Diagnosis present

## 2017-08-11 DIAGNOSIS — R197 Diarrhea, unspecified: Secondary | ICD-10-CM | POA: Diagnosis not present

## 2017-08-11 DIAGNOSIS — Z87891 Personal history of nicotine dependence: Secondary | ICD-10-CM

## 2017-08-11 DIAGNOSIS — I10 Essential (primary) hypertension: Secondary | ICD-10-CM | POA: Diagnosis present

## 2017-08-11 DIAGNOSIS — M069 Rheumatoid arthritis, unspecified: Secondary | ICD-10-CM | POA: Diagnosis present

## 2017-08-11 DIAGNOSIS — M12811 Other specific arthropathies, not elsewhere classified, right shoulder: Secondary | ICD-10-CM

## 2017-08-11 HISTORY — DX: Other specific arthropathies, not elsewhere classified, right shoulder: M12.811

## 2017-08-11 HISTORY — DX: Unspecified rotator cuff tear or rupture of right shoulder, not specified as traumatic: M75.101

## 2017-08-11 HISTORY — PX: REVERSE SHOULDER ARTHROPLASTY: SHX5054

## 2017-08-11 SURGERY — ARTHROPLASTY, SHOULDER, TOTAL, REVERSE
Anesthesia: Regional | Site: Shoulder | Laterality: Right

## 2017-08-11 MED ORDER — FENTANYL CITRATE (PF) 100 MCG/2ML IJ SOLN
INTRAMUSCULAR | Status: AC
  Administered 2017-08-11: 100 ug via INTRAVENOUS
  Filled 2017-08-11: qty 2

## 2017-08-11 MED ORDER — KETOROLAC TROMETHAMINE 30 MG/ML IJ SOLN: 30.0000 mg | Freq: Once | INTRAMUSCULAR | Status: DC | PRN

## 2017-08-11 MED ORDER — EPHEDRINE SULFATE-NACL 50-0.9 MG/10ML-% IV SOSY
PREFILLED_SYRINGE | INTRAVENOUS | Status: DC | PRN
  Administered 2017-08-11: 5 mg via INTRAVENOUS

## 2017-08-11 MED ORDER — METHYLPHENIDATE HCL 5 MG PO TABS: 2.5000 mg | ORAL_TABLET | Freq: Every day | ORAL | Status: DC | PRN

## 2017-08-11 MED ORDER — KETOROLAC TROMETHAMINE 15 MG/ML IJ SOLN
7.5000 mg | Freq: Four times a day (QID) | INTRAMUSCULAR | Status: DC
  Administered 2017-08-11 – 2017-08-12 (×3): 7.5 mg via INTRAVENOUS
  Filled 2017-08-11 (×3): qty 1

## 2017-08-11 MED ORDER — MIDAZOLAM HCL 2 MG/2ML IJ SOLN
INTRAMUSCULAR | Status: AC
  Administered 2017-08-11: 2 mg via INTRAVENOUS
  Filled 2017-08-11: qty 2

## 2017-08-11 MED ORDER — MENTHOL 3 MG MT LOZG: 1.0000 | LOZENGE | OROMUCOSAL | Status: DC | PRN

## 2017-08-11 MED ORDER — ONDANSETRON HCL 4 MG/2ML IJ SOLN
INTRAMUSCULAR | Status: AC
  Filled 2017-08-11: qty 2

## 2017-08-11 MED ORDER — ACETAMINOPHEN 500 MG PO TABS
1000.0000 mg | ORAL_TABLET | Freq: Four times a day (QID) | ORAL | Status: AC
  Administered 2017-08-11 – 2017-08-12 (×4): 1000 mg via ORAL
  Filled 2017-08-11 (×4): qty 2

## 2017-08-11 MED ORDER — SUGAMMADEX SODIUM 200 MG/2ML IV SOLN
INTRAVENOUS | Status: AC
  Filled 2017-08-11: qty 2

## 2017-08-11 MED ORDER — ALUM & MAG HYDROXIDE-SIMETH 200-200-20 MG/5ML PO SUSP: 30.0000 mL | ORAL | Status: DC | PRN

## 2017-08-11 MED ORDER — ZOLPIDEM TARTRATE 5 MG PO TABS: 5.0000 mg | ORAL_TABLET | Freq: Every evening | ORAL | Status: DC | PRN

## 2017-08-11 MED ORDER — PHENYLEPHRINE 40 MCG/ML (10ML) SYRINGE FOR IV PUSH (FOR BLOOD PRESSURE SUPPORT)
PREFILLED_SYRINGE | INTRAVENOUS | Status: AC
  Filled 2017-08-11: qty 10

## 2017-08-11 MED ORDER — CYANOCOBALAMIN 1000 MCG/ML IJ SOLN: 1000.0000 ug | INTRAMUSCULAR | Status: DC

## 2017-08-11 MED ORDER — DEXAMETHASONE SODIUM PHOSPHATE 10 MG/ML IJ SOLN
INTRAMUSCULAR | Status: DC | PRN
  Administered 2017-08-11: 10 mg via INTRAVENOUS

## 2017-08-11 MED ORDER — BISACODYL 10 MG RE SUPP: 10.0000 mg | Freq: Every day | RECTAL | Status: DC | PRN

## 2017-08-11 MED ORDER — 0.9 % SODIUM CHLORIDE (POUR BTL) OPTIME
TOPICAL | Status: DC | PRN
  Administered 2017-08-11: 1000 mL

## 2017-08-11 MED ORDER — POLYETHYLENE GLYCOL 3350 17 G PO PACK: 17.0000 g | PACK | Freq: Every day | ORAL | Status: DC | PRN

## 2017-08-11 MED ORDER — DIPHENHYDRAMINE HCL 12.5 MG/5ML PO ELIX: 12.5000 mg | ORAL_SOLUTION | ORAL | Status: DC | PRN

## 2017-08-11 MED ORDER — ROCURONIUM BROMIDE 10 MG/ML (PF) SYRINGE
PREFILLED_SYRINGE | INTRAVENOUS | Status: AC
  Filled 2017-08-11: qty 5

## 2017-08-11 MED ORDER — HYDROMORPHONE HCL 2 MG PO TABS: 2.0000 mg | ORAL_TABLET | ORAL | 0 refills | Status: DC | PRN

## 2017-08-11 MED ORDER — BUPIVACAINE HCL (PF) 0.25 % IJ SOLN: INTRAMUSCULAR | Status: DC | PRN

## 2017-08-11 MED ORDER — DIPHENHYDRAMINE HCL 25 MG PO CAPS: 50.0000 mg | ORAL_CAPSULE | Freq: Every evening | ORAL | Status: DC | PRN

## 2017-08-11 MED ORDER — DIAZEPAM 5 MG PO TABS
5.0000 mg | ORAL_TABLET | Freq: Four times a day (QID) | ORAL | Status: DC | PRN
Start: 2017-08-11 — End: 2017-08-12
  Administered 2017-08-12: 5 mg via ORAL
  Filled 2017-08-11: qty 1

## 2017-08-11 MED ORDER — FENTANYL CITRATE (PF) 100 MCG/2ML IJ SOLN
INTRAMUSCULAR | Status: DC | PRN
  Administered 2017-08-11 (×2): 50 ug via INTRAVENOUS

## 2017-08-11 MED ORDER — CARBOXYMETHYLCELLUL-GLYCERIN 0.5-0.9 % OP SOLN: 1.0000 [drp] | Freq: Every day | OPHTHALMIC | Status: DC | PRN

## 2017-08-11 MED ORDER — ACETAMINOPHEN 325 MG PO TABS: 325.0000 mg | ORAL_TABLET | ORAL | Status: DC | PRN

## 2017-08-11 MED ORDER — PHENOL 1.4 % MT LIQD: 1.0000 | OROMUCOSAL | Status: DC | PRN

## 2017-08-11 MED ORDER — TIZANIDINE HCL 4 MG PO TABS
6.0000 mg | ORAL_TABLET | Freq: Every day | ORAL | Status: DC
  Administered 2017-08-11: 6 mg via ORAL
  Filled 2017-08-11: qty 2

## 2017-08-11 MED ORDER — DEXMEDETOMIDINE HCL IN NACL 200 MCG/50ML IV SOLN
INTRAVENOUS | Status: AC
  Filled 2017-08-11: qty 50

## 2017-08-11 MED ORDER — ROCURONIUM BROMIDE 100 MG/10ML IV SOLN
INTRAVENOUS | Status: DC | PRN
  Administered 2017-08-11: 100 mg via INTRAVENOUS
  Administered 2017-08-11: 30 mg via INTRAVENOUS
  Administered 2017-08-11: 10 mg via INTRAVENOUS

## 2017-08-11 MED ORDER — PHENYLEPHRINE 40 MCG/ML (10ML) SYRINGE FOR IV PUSH (FOR BLOOD PRESSURE SUPPORT)
PREFILLED_SYRINGE | INTRAVENOUS | Status: DC | PRN
  Administered 2017-08-11: 160 ug via INTRAVENOUS
  Administered 2017-08-11: 120 ug via INTRAVENOUS
  Administered 2017-08-11: 80 ug via INTRAVENOUS
  Administered 2017-08-11: 120 ug via INTRAVENOUS

## 2017-08-11 MED ORDER — LIDOCAINE 2% (20 MG/ML) 5 ML SYRINGE
INTRAMUSCULAR | Status: DC | PRN
  Administered 2017-08-11: 100 mg via INTRAVENOUS

## 2017-08-11 MED ORDER — PROPOFOL 10 MG/ML IV BOLUS
INTRAVENOUS | Status: AC
  Filled 2017-08-11: qty 20

## 2017-08-11 MED ORDER — EPINEPHRINE 0.3 MG/0.3ML IJ SOAJ: 0.3000 mg | Freq: Once | INTRAMUSCULAR | Status: DC

## 2017-08-11 MED ORDER — MAGNESIUM CITRATE PO SOLN: 1.0000 | Freq: Once | ORAL | Status: DC | PRN

## 2017-08-11 MED ORDER — ADULT MULTIVITAMIN W/MINERALS CH
1.0000 | ORAL_TABLET | Freq: Every day | ORAL | Status: DC
  Filled 2017-08-11: qty 1

## 2017-08-11 MED ORDER — OXYCODONE HCL 5 MG/5ML PO SOLN: 5.0000 mg | Freq: Once | ORAL | Status: DC | PRN

## 2017-08-11 MED ORDER — TIZANIDINE HCL 4 MG PO TABS
4.0000 mg | ORAL_TABLET | Freq: Three times a day (TID) | ORAL | Status: DC
  Administered 2017-08-11 – 2017-08-12 (×3): 4 mg via ORAL
  Filled 2017-08-11 (×3): qty 1

## 2017-08-11 MED ORDER — ONDANSETRON HCL 4 MG/2ML IJ SOLN: 4.0000 mg | Freq: Once | INTRAMUSCULAR | Status: DC | PRN

## 2017-08-11 MED ORDER — HYDROMORPHONE HCL 1 MG/ML IJ SOLN: 0.5000 mg | INTRAMUSCULAR | Status: DC | PRN

## 2017-08-11 MED ORDER — TIZANIDINE HCL 4 MG PO TABS: 4.0000 mg | ORAL_TABLET | ORAL | Status: DC

## 2017-08-11 MED ORDER — CHLORHEXIDINE GLUCONATE 4 % EX LIQD: 60.0000 mL | Freq: Once | CUTANEOUS | Status: DC

## 2017-08-11 MED ORDER — HYDROXYZINE PAMOATE 25 MG PO CAPS
25.0000 mg | ORAL_CAPSULE | Freq: Every day | ORAL | Status: DC | PRN
Start: 2017-08-11 — End: 2017-08-12

## 2017-08-11 MED ORDER — ACETAMINOPHEN 325 MG PO TABS: 325.0000 mg | ORAL_TABLET | Freq: Four times a day (QID) | ORAL | Status: DC | PRN

## 2017-08-11 MED ORDER — SUGAMMADEX SODIUM 200 MG/2ML IV SOLN
INTRAVENOUS | Status: DC | PRN
  Administered 2017-08-11: 200 mg via INTRAVENOUS

## 2017-08-11 MED ORDER — FENTANYL CITRATE (PF) 100 MCG/2ML IJ SOLN: 25.0000 ug | INTRAMUSCULAR | Status: DC | PRN

## 2017-08-11 MED ORDER — FENTANYL CITRATE (PF) 100 MCG/2ML IJ SOLN
INTRAMUSCULAR | Status: AC
  Filled 2017-08-11: qty 2

## 2017-08-11 MED ORDER — METHOCARBAMOL 500 MG PO TABS
500.0000 mg | ORAL_TABLET | Freq: Four times a day (QID) | ORAL | Status: DC | PRN
  Administered 2017-08-12: 500 mg via ORAL
  Filled 2017-08-11: qty 1

## 2017-08-11 MED ORDER — DEXAMETHASONE SODIUM PHOSPHATE 10 MG/ML IJ SOLN
INTRAMUSCULAR | Status: AC
  Filled 2017-08-11: qty 1

## 2017-08-11 MED ORDER — METOCLOPRAMIDE HCL 5 MG/ML IJ SOLN: 5.0000 mg | Freq: Three times a day (TID) | INTRAMUSCULAR | Status: DC | PRN

## 2017-08-11 MED ORDER — ACETAMINOPHEN 160 MG/5ML PO SOLN: 325.0000 mg | ORAL | Status: DC | PRN

## 2017-08-11 MED ORDER — DOCUSATE SODIUM 100 MG PO CAPS
100.0000 mg | ORAL_CAPSULE | Freq: Two times a day (BID) | ORAL | Status: DC
  Administered 2017-08-11: 100 mg via ORAL
  Filled 2017-08-11 (×2): qty 1

## 2017-08-11 MED ORDER — PROPOFOL 10 MG/ML IV BOLUS
INTRAVENOUS | Status: DC | PRN
  Administered 2017-08-11: 200 mg via INTRAVENOUS

## 2017-08-11 MED ORDER — DIPHENHYDRAMINE HCL (SLEEP) 25 MG PO TABS: 50.0000 mg | ORAL_TABLET | Freq: Every day | ORAL | Status: DC

## 2017-08-11 MED ORDER — LIDOCAINE HCL (CARDIAC) 20 MG/ML IV SOLN
INTRAVENOUS | Status: AC
  Filled 2017-08-11: qty 5

## 2017-08-11 MED ORDER — ONDANSETRON HCL 4 MG/2ML IJ SOLN
INTRAMUSCULAR | Status: DC | PRN
  Administered 2017-08-11: 4 mg via INTRAVENOUS

## 2017-08-11 MED ORDER — CEFAZOLIN SODIUM-DEXTROSE 1-4 GM/50ML-% IV SOLN
1.0000 g | Freq: Four times a day (QID) | INTRAVENOUS | Status: AC
  Administered 2017-08-11 – 2017-08-12 (×3): 1 g via INTRAVENOUS
  Filled 2017-08-11 (×3): qty 50

## 2017-08-11 MED ORDER — DEXMEDETOMIDINE HCL 200 MCG/2ML IV SOLN
INTRAVENOUS | Status: DC | PRN
  Administered 2017-08-11 (×10): 4 ug via INTRAVENOUS

## 2017-08-11 MED ORDER — EPHEDRINE 5 MG/ML INJ
INTRAVENOUS | Status: AC
  Filled 2017-08-11: qty 10

## 2017-08-11 MED ORDER — SODIUM CHLORIDE 0.9 % IR SOLN
Status: DC | PRN
  Administered 2017-08-11: 1000 mL

## 2017-08-11 MED ORDER — PHENYLEPHRINE HCL 10 MG/ML IJ SOLN
INTRAVENOUS | Status: DC | PRN
  Administered 2017-08-11: 30 ug/min via INTRAVENOUS

## 2017-08-11 MED ORDER — MEPERIDINE HCL 50 MG/ML IJ SOLN: 6.2500 mg | INTRAMUSCULAR | Status: DC | PRN

## 2017-08-11 MED ORDER — MIDAZOLAM HCL 2 MG/2ML IJ SOLN
2.0000 mg | Freq: Once | INTRAMUSCULAR | Status: AC
Start: 2017-08-11 — End: 2017-08-11
  Administered 2017-08-11: 2 mg via INTRAVENOUS

## 2017-08-11 MED ORDER — OXYCODONE HCL 5 MG PO TABS: 5.0000 mg | ORAL_TABLET | Freq: Once | ORAL | Status: DC | PRN

## 2017-08-11 MED ORDER — METHOCARBAMOL 1000 MG/10ML IJ SOLN
500.0000 mg | Freq: Four times a day (QID) | INTRAMUSCULAR | Status: DC | PRN
  Filled 2017-08-11: qty 5

## 2017-08-11 MED ORDER — FENTANYL CITRATE (PF) 100 MCG/2ML IJ SOLN
100.0000 ug | Freq: Once | INTRAMUSCULAR | Status: AC
  Administered 2017-08-11: 100 ug via INTRAVENOUS

## 2017-08-11 MED ORDER — OXYCODONE HCL 5 MG PO TABS: 5.0000 mg | ORAL_TABLET | ORAL | Status: DC | PRN

## 2017-08-11 MED ORDER — ONDANSETRON HCL 4 MG PO TABS: 4.0000 mg | ORAL_TABLET | Freq: Four times a day (QID) | ORAL | Status: DC | PRN

## 2017-08-11 MED ORDER — LACTATED RINGERS IV SOLN
INTRAVENOUS | Status: DC
  Administered 2017-08-11 (×2): via INTRAVENOUS

## 2017-08-11 MED ORDER — OXYCODONE HCL 5 MG PO TABS
10.0000 mg | ORAL_TABLET | ORAL | Status: DC | PRN
  Administered 2017-08-11 – 2017-08-12 (×2): 10 mg via ORAL
  Administered 2017-08-12 (×2): 15 mg via ORAL
  Filled 2017-08-11 (×2): qty 3
  Filled 2017-08-11 (×2): qty 2

## 2017-08-11 MED ORDER — ONDANSETRON HCL 4 MG/2ML IJ SOLN: 4.0000 mg | Freq: Four times a day (QID) | INTRAMUSCULAR | Status: DC | PRN

## 2017-08-11 MED ORDER — POLYVINYL ALCOHOL 1.4 % OP SOLN: 1.0000 [drp] | OPHTHALMIC | Status: DC | PRN

## 2017-08-11 MED ORDER — BUPIVACAINE HCL (PF) 0.5 % IJ SOLN
INTRAMUSCULAR | Status: DC | PRN
  Administered 2017-08-11: 10 mL

## 2017-08-11 MED ORDER — POTASSIUM CHLORIDE IN NACL 20-0.45 MEQ/L-% IV SOLN
INTRAVENOUS | Status: DC
  Filled 2017-08-11: qty 1000

## 2017-08-11 MED ORDER — CEFAZOLIN SODIUM-DEXTROSE 2-4 GM/100ML-% IV SOLN
2.0000 g | INTRAVENOUS | Status: AC
  Administered 2017-08-11: 2 g via INTRAVENOUS
  Filled 2017-08-11: qty 100

## 2017-08-11 MED ORDER — METOCLOPRAMIDE HCL 5 MG PO TABS: 5.0000 mg | ORAL_TABLET | Freq: Three times a day (TID) | ORAL | Status: DC | PRN

## 2017-08-11 MED ORDER — LOSARTAN POTASSIUM 50 MG PO TABS
100.0000 mg | ORAL_TABLET | Freq: Every day | ORAL | Status: DC
  Administered 2017-08-11: 100 mg via ORAL
  Filled 2017-08-11: qty 2

## 2017-08-11 MED ORDER — BUPIVACAINE HCL (PF) 0.25 % IJ SOLN
INTRAMUSCULAR | Status: AC
  Filled 2017-08-11: qty 20

## 2017-08-11 MED ORDER — HYPROMELLOSE (GONIOSCOPIC) 2.5 % OP SOLN
1.0000 [drp] | Freq: Every day | OPHTHALMIC | Status: DC | PRN
  Filled 2017-08-11: qty 15

## 2017-08-11 MED ORDER — BUPIVACAINE LIPOSOME 1.3 % IJ SUSP
INTRAMUSCULAR | Status: DC | PRN
  Administered 2017-08-11: 10 mL

## 2017-08-11 SURGICAL SUPPLY — 81 items
ADPR HD STD TPR HUM TI RVRS (Orthopedic Implant) ×1 IMPLANT
AID PSTN UNV HD RSTRNT DISP (MISCELLANEOUS) ×1
APL SKNCLS STERI-STRIP NONHPOA (GAUZE/BANDAGES/DRESSINGS) ×1
BEARING HUMERAL E1 44-41 (Miscellaneous) ×1 IMPLANT
BEARING HUMERAL E1 44-41MM (Miscellaneous) ×1 IMPLANT
BENZOIN TINCTURE PRP APPL 2/3 (GAUZE/BANDAGES/DRESSINGS) ×3 IMPLANT
BIT DRILL TWIST 2.7 (BIT) ×1 IMPLANT
BIT DRILL TWIST 2.7MM (BIT) ×1
BLADE SAW SAG 73X25 THK (BLADE) ×2
BLADE SAW SGTL 73X25 THK (BLADE) ×1 IMPLANT
BOOTCOVER CLEANROOM LRG (PROTECTIVE WEAR) ×6 IMPLANT
BOWL SMART MIX CTS (DISPOSABLE) IMPLANT
BRNG HUM STD 44-41 SHLDR E1 (Miscellaneous) ×1 IMPLANT
BRUSH FEMORAL CANAL (MISCELLANEOUS) IMPLANT
BSPLAT GLND 28 SHLDR RVRS SYS (Orthopedic Implant) ×1 IMPLANT
CLOSURE STERI-STRIP 1/2X4 (GAUZE/BANDAGES/DRESSINGS) ×1
CLSR STERI-STRIP ANTIMIC 1/2X4 (GAUZE/BANDAGES/DRESSINGS) ×2 IMPLANT
COVER SURGICAL LIGHT HANDLE (MISCELLANEOUS) ×3 IMPLANT
DRAPE C-ARM 42X72 X-RAY (DRAPES) IMPLANT
DRAPE ORTHO SPLIT 77X108 STRL (DRAPES) ×6
DRAPE SURG ORHT 6 SPLT 77X108 (DRAPES) ×2 IMPLANT
DRAPE U-SHAPE 47X51 STRL (DRAPES) ×3 IMPLANT
DRSG MEPILEX BORDER 4X8 (GAUZE/BANDAGES/DRESSINGS) ×2 IMPLANT
DURAPREP 26ML APPLICATOR (WOUND CARE) ×3 IMPLANT
ELECT REM PT RETURN 9FT ADLT (ELECTROSURGICAL) ×3
ELECTRODE REM PT RTRN 9FT ADLT (ELECTROSURGICAL) ×1 IMPLANT
EVACUATOR 1/8 PVC DRAIN (DRAIN) IMPLANT
FACESHIELD WRAPAROUND (MASK) IMPLANT
FACESHIELD WRAPAROUND OR TEAM (MASK) IMPLANT
GLENOID SPHERE MED 3INX5IN (Orthopedic Implant) ×2 IMPLANT
GLENOID SPHERE STRL 28MM (Orthopedic Implant) ×2 IMPLANT
GLOVE BIOGEL PI IND STRL 8 (GLOVE) ×1 IMPLANT
GLOVE BIOGEL PI INDICATOR 8 (GLOVE) ×2
GLOVE BIOGEL PI ORTHO PRO SZ8 (GLOVE) ×2
GLOVE ORTHO TXT STRL SZ7.5 (GLOVE) ×3 IMPLANT
GLOVE PI ORTHO PRO STRL SZ8 (GLOVE) ×1 IMPLANT
GLOVE SURG ORTHO 8.0 STRL STRW (GLOVE) ×6 IMPLANT
GOWN STRL REUS W/ TWL XL LVL3 (GOWN DISPOSABLE) ×1 IMPLANT
GOWN STRL REUS W/TWL 2XL LVL3 (GOWN DISPOSABLE) ×3 IMPLANT
GOWN STRL REUS W/TWL XL LVL3 (GOWN DISPOSABLE) ×3
HANDPIECE INTERPULSE COAX TIP (DISPOSABLE)
HEAD HUMERAL COMP STD (Orthopedic Implant) IMPLANT
HOOD PEEL AWAY FACE SHEILD DIS (HOOD) ×6 IMPLANT
HUMERAL HEAD COMP STD (Orthopedic Implant) ×3 IMPLANT
KIT BASIN OR (CUSTOM PROCEDURE TRAY) ×3 IMPLANT
KIT ROOM TURNOVER OR (KITS) ×3 IMPLANT
MANIFOLD NEPTUNE II (INSTRUMENTS) ×3 IMPLANT
NS IRRIG 1000ML POUR BTL (IV SOLUTION) ×3 IMPLANT
PACK SHOULDER (CUSTOM PROCEDURE TRAY) ×3 IMPLANT
PAD ARMBOARD 7.5X6 YLW CONV (MISCELLANEOUS) ×6 IMPLANT
PIN THREADED REVERSE (PIN) ×2 IMPLANT
RESTRAINT HEAD UNIVERSAL NS (MISCELLANEOUS) ×2 IMPLANT
SCREW BONE LOCKING 4.75X30X3.5 (Screw) ×4 IMPLANT
SCREW BONE STRL 6.5MMX30MM (Screw) ×2 IMPLANT
SCREW LOCKING 4.75MMX15MM (Screw) ×4 IMPLANT
SET HNDPC FAN SPRY TIP SCT (DISPOSABLE) IMPLANT
SLING ARM IMMOBILIZER LRG (SOFTGOODS) ×4 IMPLANT
SLING ARM IMMOBILIZER MED (SOFTGOODS) IMPLANT
SMARTMIX MINI TOWER (MISCELLANEOUS)
SPONGE LAP 18X18 X RAY DECT (DISPOSABLE) ×3 IMPLANT
STEM HUMERAL STRL 13MMX55MM (Stem) ×2 IMPLANT
SUCTION FRAZIER HANDLE 10FR (MISCELLANEOUS) ×2
SUCTION TUBE FRAZIER 10FR DISP (MISCELLANEOUS) ×1 IMPLANT
SUPPORT WRAP ARM LG (MISCELLANEOUS) ×3 IMPLANT
SUT ETHIBOND 2 0 SH (SUTURE)
SUT ETHIBOND 2 0 SH 36X2 (SUTURE) IMPLANT
SUT ETHIBOND 2 OS 4 DA (SUTURE) IMPLANT
SUT ETHIBOND CT1 BRD 2-0 30IN (SUTURE) IMPLANT
SUT FIBERWIRE #2 38 REV NDL BL (SUTURE) ×3
SUT FIBERWIRE #2 38 T-5 BLUE (SUTURE) ×9
SUT VIC AB 2-0 CT1 27 (SUTURE) ×6
SUT VIC AB 2-0 CT1 TAPERPNT 27 (SUTURE) ×1 IMPLANT
SUT VIC AB 3-0 SH 8-18 (SUTURE) ×4 IMPLANT
SUTURE FIBERWR #2 38 T-5 BLUE (SUTURE) IMPLANT
SUTURE FIBERWR#2 38 REV NDL BL (SUTURE) IMPLANT
TOWEL OR 17X24 6PK STRL BLUE (TOWEL DISPOSABLE) ×3 IMPLANT
TOWEL OR 17X26 10 PK STRL BLUE (TOWEL DISPOSABLE) ×3 IMPLANT
TOWER SMARTMIX MINI (MISCELLANEOUS) IMPLANT
TRAY FOLEY W/METER SILVER 16FR (SET/KITS/TRAYS/PACK) IMPLANT
TRAY HUM STD 44MM (Orthopedic Implant) ×2 IMPLANT
WATER STERILE IRR 1000ML POUR (IV SOLUTION) ×3 IMPLANT

## 2017-08-11 NOTE — Op Note (Signed)
08/11/2017  3:40 PM  PATIENT:  [name].    PRE-OPERATIVE DIAGNOSIS: Right rotator cuff arthropathy  POST-OPERATIVE DIAGNOSIS:  Same  PROCEDURE:  Reverse Total Shoulder Arthroplasty  SURGEON:  Eulas Post, MD  PHYSICIAN ASSISTANT: Janace Litten, OPA-C, present and scrubbed throughout the case, critical for completion in a timely fashion, and for retraction, instrumentation, and closure.  ANESTHESIA:   General with regional block  ESTIMATED BLOOD LOSS: 100 mL  UNIQUE ASPECTS OF THE CASE: He has reasonably good motion, although had substantial osteophyte formation both anteriorly and posteriorly on both the humerus and the glenoid.  The glenoid had a huge superior arc of bone, which was effectively ossification of the undersurface of the cuff.  I debated how aggressive to get with this, but I ultimately elected not to resect the superior bone, as it did not appear to impinge on the prosthesis.  I did use a 41 mm Glennosphere in order to lateralize and inferiorize the position.  The glenoid was quite large, and accommodated a standard size easily.  The subscapularis was intact, although there was substantial scar formation between the subscapularis and the conjoined tendon.  I did not mobilize this completely, but had good access to the subscapularis for a repair of the sleeve.  PREOPERATIVE INDICATIONS:  [name]. is a  62 y.o. male with a diagnosis right rotator cuff arthropathy who failed conservative measures and elected for surgical management.    The risks benefits and alternatives were discussed with the patient preoperatively including but not limited to the risks of infection, bleeding, nerve injury, cardiopulmonary complications, the need for revision surgery, dislocation, brachial plexus palsy, incomplete relief of pain, among others, and the patient was willing to proceed.  OPERATIVE IMPLANTS: Biomet size 13 humeral stem press-fit standard with a 44 mm reverse  shoulder arthroplasty tray with a standard liner and a 41 mm glenosphere with a standard baseplate and 4 locking screws and one central nonlocking screw.  OPERATIVE FINDINGS: Advanced rotator cuff arthropathy with loose body formation both on the anterior and posterior and superior glenoid although the superior and posterior aspect was not necessarily loose, but there was substantial osteophyte formation on the humeral head.  The superior rotator cuff tissue was poor quality, and there was acetabularization superiorly.  OPERATIVE PROCEDURE: The patient was brought to the operating room and placed in the supine position. General anesthesia was administered. IV antibiotics were given.  Time out was performed. The upper extremity was prepped and draped in usual sterile fashion. The patient was in a beachchair position. Deltopectoral approach was carried out.  The biceps was not present within the groove.  I released the subscapularis using a peel technique.  I then performed circumferential releases of the humerus, and then dislocated the head, and then reamed with the reamer to the above named size.  I then applied the jig, and cut the humeral head in 30 of retroversion, and then turned my attention to the glenoid.  Deep retractors were placed, and I resected the labrum, and then placed a guidepin into the center position on the glenoid, with slight inferior inclination. I then reamed over the guidepin, and this created a small metaphyseal cancellus blush inferiorly, removing just the cartilage to the subchondral bone superiorly. The base plate was selected and impacted place, and then I secured it centrally with a nonlocking screw, and I had excellent purchase both inferiorly and superiorly. I placed a short locking screws on anterior and  posterior aspects.  I then turned my attention to the glenosphere, and impacted this into place, placing slight inferior offset (set on B).   The glenosphere was  completely seated, and had engagement of the [facility] taper. I then turned my attention back to the humerus.  I sequentially broached, and then trialed, and was found to restore soft tissue tension, and it had 2 finger tightness.  I was between a 13 and a 14, but the 14 seemed tight and I was concerned that I might have too much length, if it ended up proud, and so I went with the 13 that had excellent bony fixation.  Therefore the above named components were selected. The shoulder felt stable throughout functional motion.  Before I placed the real prosthesis I had also placed a total of 3 #2 FiberWire through drill holes in the humerus for later subscapularis repair.  I then impacted the real prosthesis into place, as well as the real humeral tray, and reduced the shoulder. The shoulder had excellent motion, and was stable, and I irrigated the wounds copiously.    I then used these to repair the subscapularis. This came down to bone.  I then irrigated the shoulder copiously once more, repaired the deltopectoral interval with Vicryl followed by subcutaneous Vicryl with Steri-Strips and sterile gauze for the skin. The patient was awakened and returned back in stable and satisfactory condition. There no complications and He tolerated the procedure well.

## 2017-08-11 NOTE — Anesthesia Procedure Notes (Signed)
Anesthesia Regional Block: Interscalene brachial plexus block   Pre-Anesthetic Checklist: ,, timeout performed, Correct Patient, Correct Site, Correct Laterality, Correct Procedure, Correct Position, site marked, Risks and benefits discussed,  Surgical consent,  Pre-op evaluation,  At surgeon's request and post-op pain management  Laterality: Right  Prep: chloraprep       Needles:  Injection technique: Single-shot  Needle Type: Echogenic Stimulator Needle     Needle Length: 5cm  Needle Gauge: 22     Additional Needles:   Procedures:, nerve stimulator,,, ultrasound used (permanent image in chart),,,,  Narrative:  Start time: 08/11/2017 1:00 PM End time: 08/11/2017 1:05 PM Injection made incrementally with aspirations every 5 mL.  Performed by: Personally  Anesthesiologist: Bethena Midget, MD  Additional Notes: Functioning IV was confirmed and monitors were applied.  A 34mm 22ga Arrow echogenic stimulator needle was used. Sterile prep and drape,hand hygiene and sterile gloves were used. Ultrasound guidance: relevant anatomy identified, needle position confirmed, local anesthetic spread visualized around nerve(s)., vascular puncture avoided.  Image printed for medical record. Negative aspiration and negative test dose prior to incremental administration of local anesthetic. The patient tolerated the procedure well.

## 2017-08-11 NOTE — H&P (Signed)
PREOPERATIVE H&P  Chief Complaint: Right shoulder pain  HPI: [name]. is a 62 y.o. male who presents for preoperative history and physical with a diagnosis of right shoulder rotator cuff arthropathy. Symptoms are rated as moderate to severe, and have been worsening.  This is significantly impairing activities of daily living.  He has elected for surgical management.   He has failed injections, activity modification, anti-inflammatories, previous surgical intervention, chronic narcotic management, and assistive devices.  Preoperative X-rays demonstrate end stage degenerative changes with osteophyte formation, loss of joint space, subchondral sclerosis.   Past Medical History:  Diagnosis Date  . ADHD (attention deficit hyperactivity disorder)   . Arthritis    neck, back  . Chronic lower back pain   . GERD (gastroesophageal reflux disease)    TUMS as needed  . Headache   . History of hiatal hernia   . Hypertension   . Pinched cervical nerve root   . Rheumatoid arthritis(714.0)    hands  . Rotator cuff tear, left 05/2011   Past Surgical History:  Procedure Laterality Date  . CARDIAC CATHETERIZATION     20 yrs ago x2  . CARPAL TUNNEL RELEASE  06/04/2006; 06/15/2008   left; right  . CERVICAL SPINE SURGERY    . 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 SHOULDER SURGERY     right and left   Social History   Socioeconomic History  . Marital status: Married    Spouse name: Not on file  . Number of children: Not on file  . Years of education: Not on file  . Highest education level: Not on file  Occupational History  . Not on file  Social Needs  . Financial resource strain: Not on file  . Food insecurity:    Worry: Not on file    Inability: Not on file  . Transportation needs:    Medical: Not on file    Non-medical: Not on file  Tobacco Use  . Smoking status: Former Smoker    Packs/day: 2.00    Years:  15.00    Pack years: 30.00    Types: Cigarettes  . Smokeless tobacco: Never Used  Substance and Sexual Activity  . Alcohol use: No    Frequency: Never  . Drug use: Yes    Types: Marijuana    Comment: 1-2x daily  . Sexual activity: Not on file  Lifestyle  . Physical activity:    Days per week: Not on file    Minutes per session: Not on file  . Stress: Not on file  Relationships  . Social connections:    Talks on phone: Not on file    Gets together: Not on file    Attends religious service: Not on file    Active member of club or organization: Not on file    Attends meetings of clubs or organizations: Not on file    Relationship status: Not on file  Other Topics Concern  . Not on file  Social History Narrative  . Not on file   History reviewed. No pertinent family history. Allergies  Allergen Reactions  . Bee Venom Swelling    SWELLING REACTION UNSPECIFIED   . Penicillins Other (See Comments)    UNSPECIFIED REACTION of CHILDHOOD Has patient had a PCN reaction causing immediate rash, facial/tongue/throat swelling, SOB or lightheadedness with hypotension: Unknown Has patient had a PCN reaction causing severe rash involving mucus membranes or  skin necrosis: Unknown Has patient had a PCN reaction that required hospitalization: Unknown Has patient had a PCN reaction occurring within the last 10 years: No If all of the above answers are "NO", then may proceed with Cephalosporin use.    Prior to Admission medications   Medication Sig Start Date End Date Taking? Authorizing Provider  Carboxymethylcellul-Glycerin (LUBRICATING EYE DROPS OP) Place 1 drop into both eyes daily as needed (dry eyes).   Yes [provider]  cyanocobalamin (,VITAMIN B-12,) 1000 MCG/ML injection Inject 1,000 mcg into the muscle every 14 (fourteen) days. 05/21/17  Yes [provider]  diazepam (VALIUM) 5 MG tablet Take 5 mg by mouth every 6 (six) hours as needed for muscle spasms.    Yes  [provider]  diclofenac sodium (VOLTAREN) 1 % GEL Apply 1 application topically daily as needed (pain).   Yes [provider]  diphenhydrAMINE (SOMINEX) 25 MG tablet Take 50 mg by mouth at bedtime.    Yes [provider]  hydrOXYzine (VISTARIL) 25 MG capsule Take 25 mg by mouth daily as needed for anxiety. 04/29/17  Yes [provider]  losartan (COZAAR) 100 MG tablet Take 100 mg by mouth daily with supper.   Yes [provider]  Multiple Vitamin (MULTIVITAMIN) tablet Take 1 tablet by mouth daily.   Yes [provider]  Oxycodone HCl 10 MG TABS Take 10 mg by mouth every 4 (four) hours as needed (pain).   Yes [provider]  PRESCRIPTION MEDICATION Apply 1 application topically 2 (two) times daily as needed (joint pain). Ketoprofen 10% Baclofen 2% Lidocaine 5%   Yes [provider]  tiZANidine (ZANAFLEX) 4 MG tablet Take 4-6 mg by mouth See admin instructions. Take 4 mg by mouth 3 times daily. Take 6 mg by mouth at bedtime   Yes [provider]  EPINEPHrine 0.3 mg/0.3 mL IJ SOAJ injection Inject 0.3 mg into the muscle once.  12/16/04   [provider]  methylphenidate (RITALIN) 10 MG tablet Take 2.5-5 mg by mouth daily as needed (focus).    [provider]  Omega-3 Fatty Acids (FISH OIL) 1200 MG CAPS Take 1,200 mg by mouth daily.    [provider]     Positive ROS: All other systems have been reviewed and were otherwise negative with the exception of those mentioned in the HPI and as above.  Physical Exam: General: Alert, no acute distress Cardiovascular: No pedal edema Respiratory: No cyanosis, no use of accessory musculature GI: No organomegaly, abdomen is soft and non-tender Skin: No lesions in the area of chief complaint Neurologic: Sensation intact distally Psychiatric: Patient is competent for consent with normal mood and affect Lymphatic: No axillary or cervical  lymphadenopathy  MUSCULOSKELETAL: Right shoulder has well-healed surgical wounds with range of motion 0-150 degrees and external rotation 45 degrees with weakness with cuff testing.  Assessment: Right shoulder rotator cuff arthropathy with coexisting cervical spine pathology, history of rotator cuff repair, coexisting chronic pain and headaches   Plan: Plan for Procedure(s): REVERSE SHOULDER ARTHROPLASTY RIGHT  The risks benefits and alternatives were discussed with the patient including but not limited to the risks of nonoperative treatment, versus surgical intervention including infection, bleeding, nerve injury,  blood clots, cardiopulmonary complications, morbidity, mortality, among others, and they were willing to proceed.      Eulas Post, MD Cell [phone]   08/11/2017 12:49 PM

## 2017-08-11 NOTE — Discharge Instructions (Signed)

## 2017-08-11 NOTE — Anesthesia Preprocedure Evaluation (Addendum)
Anesthesia Evaluation  Patient identified by MRN, date of birth, ID band Patient awake    Reviewed: Allergy & Precautions, H&P , NPO status , Patient's Chart, lab work & pertinent test results  Airway Mallampati: II   Neck ROM: full    Dental   Pulmonary former smoker,    Pulmonary exam normal        Cardiovascular hypertension, Pt. on medications Normal cardiovascular exam     Neuro/Psych  Neuromuscular disease    GI/Hepatic GERD  Medicated,  Endo/Other    Renal/GU      Musculoskeletal  (+) Arthritis , Rheumatoid disorders,    Abdominal   Peds  Hematology   Anesthesia Other Findings   Reproductive/Obstetrics                            Anesthesia Physical  Anesthesia Plan  ASA: II  Anesthesia Plan: General and Regional   Post-op Pain Management:  Regional for Post-op pain   Induction: Intravenous  PONV Risk Score and Plan: 1 and Ondansetron and Treatment may vary due to age or medical condition  Airway Management Planned: Oral ETT  Additional Equipment:   Intra-op Plan:   Post-operative Plan: Extubation in OR  Informed Consent: I have reviewed the patients History and Physical, chart, labs and discussed the procedure including the risks, benefits and alternatives for the proposed anesthesia with the patient or authorized representative who has indicated his/her understanding and acceptance.     Plan Discussed with: CRNA, Surgeon and Anesthesiologist  Anesthesia Plan Comments:         Anesthesia Quick Evaluation

## 2017-08-11 NOTE — Transfer of Care (Signed)
Immediate Anesthesia Transfer of Care Note  Patient: [name].  Procedure(s) Performed: REVERSE SHOULDER ARTHROPLASTY RIGHT (Right Shoulder)  Patient Location: PACU  Anesthesia Type:General  Level of Consciousness: drowsy and patient cooperative  Airway & Oxygen Therapy: Patient Spontanous Breathing and Patient connected to face mask oxygen  Post-op Assessment: Report given to RN and Post -op Vital signs reviewed and stable  Post vital signs: Reviewed and stable  Last Vitals:  Vitals Value Taken Time  BP 123/84 08/11/2017  4:02 PM  Temp 36.5 C 08/11/2017  4:00 PM  Pulse 88 08/11/2017  4:05 PM  Resp 19 08/11/2017  4:05 PM  SpO2 97 % 08/11/2017  4:05 PM  Vitals shown include unvalidated device data.  Last Pain:  Vitals:   08/11/17 1215  TempSrc:   PainSc: 5       Patients Stated Pain Goal: 3 (08/11/17 1215)  Complications: No apparent anesthesia complications

## 2017-08-11 NOTE — Anesthesia Postprocedure Evaluation (Signed)
Anesthesia Post Note  Patient: [name].  Procedure(s) Performed: REVERSE SHOULDER ARTHROPLASTY RIGHT (Right Shoulder)     Patient location during evaluation: PACU Anesthesia Type: Regional Level of consciousness: awake and alert Pain management: pain level controlled Vital Signs Assessment: post-procedure vital signs reviewed and stable Respiratory status: spontaneous breathing, nonlabored ventilation, respiratory function stable and patient connected to nasal cannula oxygen Cardiovascular status: blood pressure returned to baseline and stable Postop Assessment: no apparent nausea or vomiting Anesthetic complications: no    Last Vitals:  Vitals:   08/11/17 1728 08/11/17 1748  BP:  115/77  Pulse:  (!) 102  Resp:  18  Temp: (!) 36.3 C (!) 36.4 C  SpO2:  96%    Last Pain:  Vitals:   08/11/17 1728  TempSrc:   PainSc: 0-No pain                 Daimen Shovlin DAVID

## 2017-08-11 NOTE — Anesthesia Procedure Notes (Signed)
Procedure Name: Intubation Date/Time: 08/11/2017 1:46 PM Performed by: Marny Lowenstein, CRNA Pre-anesthesia Checklist: Patient identified, Emergency Drugs available, Suction available, Patient being monitored and Timeout performed Patient Re-evaluated:Patient Re-evaluated prior to induction Oxygen Delivery Method: Circle system utilized Preoxygenation: Pre-oxygenation with 100% oxygen Induction Type: IV induction and Cricoid Pressure applied Laryngoscope Size: Miller and 3 Grade View: Grade I Tube size: 7.5 mm Number of attempts: 1 Airway Equipment and Method: Patient positioned with wedge pillow Placement Confirmation: ETT inserted through vocal cords under direct vision,  positive ETCO2,  CO2 detector and breath sounds checked- equal and bilateral Secured at: 22 cm Tube secured with: Tape Dental Injury: Teeth and Oropharynx as per pre-operative assessment  Comments: Pt with increased "indigestion" in preop; Modified RSI

## 2017-08-12 ENCOUNTER — Encounter (HOSPITAL_COMMUNITY): Payer: Self-pay | Admitting: Orthopedic Surgery

## 2017-08-12 LAB — CBC
HCT: 38.4 % — ABNORMAL LOW (ref 39.0–52.0)
HEMOGLOBIN: 12.9 g/dL — AB (ref 13.0–17.0)
MCH: 31.9 pg (ref 26.0–34.0)
MCHC: 33.6 g/dL (ref 30.0–36.0)
MCV: 94.8 fL (ref 78.0–100.0)
Platelets: 256 10*3/uL (ref 150–400)
RBC: 4.05 MIL/uL — AB (ref 4.22–5.81)
RDW: 13.5 % (ref 11.5–15.5)
WBC: 17 10*3/uL — ABNORMAL HIGH (ref 4.0–10.5)

## 2017-08-12 LAB — BASIC METABOLIC PANEL
ANION GAP: 11 (ref 5–15)
BUN: 14 mg/dL (ref 6–20)
CHLORIDE: 106 mmol/L (ref 101–111)
CO2: 21 mmol/L — ABNORMAL LOW (ref 22–32)
Calcium: 9.4 mg/dL (ref 8.9–10.3)
Creatinine, Ser: 0.95 mg/dL (ref 0.61–1.24)
GFR calc non Af Amer: >60 mL/min (ref >60)
Glucose, Bld: 156 mg/dL — ABNORMAL HIGH (ref 65–99)
POTASSIUM: 4 mmol/L (ref 3.5–5.1)
SODIUM: 138 mmol/L (ref 135–145)

## 2017-08-12 MED ORDER — LOPERAMIDE HCL 2 MG PO CAPS
2.0000 mg | ORAL_CAPSULE | ORAL | Status: DC | PRN
  Administered 2017-08-12: 4 mg via ORAL
  Filled 2017-08-12 (×3): qty 2

## 2017-08-12 NOTE — Progress Notes (Signed)
     Subjective:  Patient reports pain as mild.  Block still working.  Complains of back pain and diarrhea.   Objective:   VITALS:   Vitals:   08/11/17 2000 08/11/17 2351 08/12/17 0400 08/12/17 0738  BP: 121/83 94/62 107/79 (!) 161/79  Pulse: 94 92 97 (!) 104  Resp: 18 18 20 20   Temp: 97.7 F (36.5 C) 97.8 F (36.6 C) 98.2 F (36.8 C) 97.8 F (36.6 C)  TempSrc: Oral Oral Oral Oral  SpO2: 95% 94% 95% 94%    dresssing clean.  Sensation absent over shoulder and hand.  Able to flicker flexion in fingers, but no extension.  Lab Results  Component Value Date   WBC 17.0 (H) 08/12/2017   HGB 12.9 (L) 08/12/2017   HCT 38.4 (L) 08/12/2017   MCV 94.8 08/12/2017   PLT 256 08/12/2017   BMET    Component Value Date/Time   NA 138 08/12/2017 0554   K 4.0 08/12/2017 0554   CL 106 08/12/2017 0554   CO2 21 (L) 08/12/2017 0554   GLUCOSE 156 (H) 08/12/2017 0554   BUN 14 08/12/2017 0554   CREATININE 0.95 08/12/2017 0554   CALCIUM 9.4 08/12/2017 0554   GFRNONAA >60 08/12/2017 0554   GFRAA >60 08/12/2017 0554     Assessment/Plan: 1 Day Post-Op   Principal Problem:   Right rotator cuff tear arthropathy Active Problems:   Primary localized osteoarthrosis of shoulder   Advance diet Up with therapy Probable discharge home later today, pain meds with oxy and dilaudid, chronic pain, neurologic status still impaired from block.  Monitor resolution, anticipate increased pain.   Inpatient only procedure.     08/14/2017 08/12/2017, 8:13 AM   08/14/2017, MD Cell [phone]

## 2017-08-12 NOTE — Evaluation (Signed)
Occupational Therapy Evaluation Patient Details Name: [name]. MRN: [id number] DOB: Oct 12, 1955 Today's Date: 08/12/2017    History of Present Illness 62 yo male s/p REVERSE SHOULDER ARTHROPLASTY RIGHT (Right Shoulder)   Clinical Impression   Patient evaluated by Occupational Therapy with no further acute OT needs identified. All education has been completed and the patient has no further questions. See below for any follow-up Occupational Therapy or equipment needs. OT to sign off. Thank you for referral.      Follow Up Recommendations  Follow surgeon's recommendation for DC plan and follow-up therapies    Equipment Recommendations  None recommended by OT    Recommendations for Other Services       Precautions / Restrictions Precautions Precautions: Shoulder Type of Shoulder Precautions: conservative Shoulder Interventions: At all times Precaution Comments: handout for shoulder protocol provided and NWB instructions Required Braces or Orthoses: Sling Restrictions Weight Bearing Restrictions: Yes RUE Weight Bearing: Non weight bearing      Mobility Bed Mobility Overal bed mobility: Modified Independent             General bed mobility comments: advised to exit on L side to decr risk for weight on R UE  Transfers                 General transfer comment: pt currently with HR 121 sitting in the bed after transfer to bathroom on OT arrival. pt reports feeling like his heart is racing. RN in room addressing concerns and checking vitals. VSS at this time but HR elevated    Balance                                           ADL either performed or assessed with clinical judgement   ADL Overall ADL's : Needs assistance/impaired Eating/Feeding: Set up;Sitting   Grooming: Wash/dry hands;Wash/dry face;Oral care;Minimal assistance;Sitting   Upper Body Bathing: Moderate assistance   Lower Body Bathing: Moderate assistance          Lower Body Dressing Details (indicate cue type and reason): pt is able to cross legs long sitting in the bed Toilet Transfer: Minimal assistance Toilet Transfer Details (indicate cue type and reason): pt observed exiting with RN as session started       Tub/Shower Transfer Details (indicate cue type and reason): educated on sitting on seat for washing hair and preventing shoulder from getting wet until MD okays a shower per d/c orders. wife was concerned about oil in hair prior to full shower.    General ADL Comments: pt and wife educated on shoulder protocol handout with adls and don doff sling. Pt educated on proper sling positioning. pt educated on proper positioning of arm in chair or bed .     Educated patient never to wash directly on incision site, always use fresh clean linen (one time use then place in laundry), sleeping positioning, avoid putting pillow under knee  Vision   Vision Assessment?: No apparent visual deficits     Perception     Praxis      Pertinent Vitals/Pain Pain Assessment: Faces Faces Pain Scale: Hurts little more Pain Location: back Pain Descriptors / Indicators: Discomfort Pain Intervention(s): Monitored during session;Premedicated before session;Repositioned     Hand Dominance Right   Extremity/Trunk Assessment Upper Extremity Assessment Upper Extremity Assessment: RUE deficits/detail RUE Deficits / Details: s/p surg with nerve block  still in place/ noted to have duputens RUE Sensation: decreased light touch;decreased proprioception RUE Coordination: decreased fine motor;decreased gross motor   Lower Extremity Assessment Lower Extremity Assessment: Overall WFL for tasks assessed   Cervical / Trunk Assessment Cervical / Trunk Assessment: Other exceptions(previous back surg by Ditty per patients self reports)   Communication Communication Communication: No difficulties   Cognition Arousal/Alertness: Awake/alert Behavior During Therapy: WFL  for tasks assessed/performed Overall Cognitive Status: Within Functional Limits for tasks assessed                                     General Comments  dressing dry intact with ice on incision    Exercises Exercises: Shoulder Shoulder Exercises Elbow Flexion: PROM;10 reps;Right;Seated Wrist Flexion: PROM;Right;10 reps;Seated Digit Composite Flexion: AROM;Right;10 reps;Seated   Shoulder Instructions Shoulder Instructions Donning/doffing shirt without moving shoulder: Patient able to independently direct caregiver Method for sponge bathing under operated UE: Patient able to independently direct caregiver Donning/doffing sling/immobilizer: Patient able to independently direct caregiver Correct positioning of sling/immobilizer: Patient able to independently direct caregiver Pendulum exercises (written home exercise program): (na) ROM for elbow, wrist and digits of operated UE: Patient able to independently direct caregiver(currently with nerve block in place so only digits moving) Sling wearing schedule (on at all times/off for ADL's): Independent Proper positioning of operated UE when showering: Patient able to independently direct caregiver Positioning of UE while sleeping: Patient able to independently direct caregiver    Home Living Family/patient expects to be discharged to:: Private residence Living Arrangements: Spouse/significant other Available Help at Discharge: Family Type of Home: House             Bathroom Shower/Tub: Walk-in Soil scientist Toilet: Standard     Home Equipment: Bedside commode;Shower seat(electric bed with hob raising, lift chair)   Additional Comments: has a small dog at home that patient normally cares for but will have wife (A) for now      Prior Functioning/Environment Level of Independence: Independent                 OT Problem List:        OT Treatment/Interventions:      OT Goals(Current goals can be  found in the care plan section) Acute Rehab OT Goals Patient Stated Goal: to go home to dog OT Goal Formulation: With patient  OT Frequency:     Barriers to D/C:            Co-evaluation              AM-PAC PT "6 Clicks" Daily Activity     Outcome Measure Help from another person eating meals?: A Little Help from another person taking care of personal grooming?: A Little Help from another person toileting, which includes using toliet, bedpan, or urinal?: A Little Help from another person bathing (including washing, rinsing, drying)?: A Little Help from another person to put on and taking off regular upper body clothing?: A Little Help from another person to put on and taking off regular lower body clothing?: A Little 6 Click Score: 18   End of Session Nurse Communication: Mobility status;Precautions;Weight bearing status  Activity Tolerance: Patient tolerated treatment well Patient left: in bed;with call bell/phone within reach  OT Visit Diagnosis: Unsteadiness on feet (R26.81)                Time: 0623-7628 OT Time Calculation (  min): 29 min Charges:  OT General Charges $OT Visit: 1 Visit OT Evaluation $OT Eval Moderate Complexity: 1 Mod OT Treatments $Self Care/Home Management : 8-22 mins G-Codes:      Mateo Flow   OTR/L Pager: [phone] Office: [phone] .   Boone Master B 08/12/2017, 2:36 PM

## 2017-08-12 NOTE — Progress Notes (Signed)
Patient alert and oriented, mae's well, voiding adequate amount of urine, swallowing without difficulty, c/o mild pain at time of discharge. Patient discharged home with family. Script and discharged instructions given to patient. Patient and family stated understanding of instructions given. Patient has an appointment with Dr. Dion Saucier

## 2017-08-12 NOTE — Discharge Summary (Signed)
Physician Discharge Summary  Patient ID: [name]. MRN: [id number] DOB/AGE: [birth year] 61 y.o.  Admit date: 08/11/2017 Discharge date: 08/12/2017  Admission Diagnoses:  Right rotator cuff tear arthropathy  Discharge Diagnoses:  Principal Problem:   Right rotator cuff tear arthropathy Active Problems:   Primary localized osteoarthrosis of shoulder   Past Medical History:  Diagnosis Date  . ADHD (attention deficit hyperactivity disorder)   . Arthritis    neck, back  . Chronic lower back pain   . GERD (gastroesophageal reflux disease)    TUMS as needed  . Headache   . History of hiatal hernia   . Hypertension   . Pinched cervical nerve root   . Rheumatoid arthritis(714.0)    hands  . Right rotator cuff tear arthropathy 08/11/2017  . Rotator cuff tear, left 05/2011    Surgeries: Procedure(s): REVERSE SHOULDER ARTHROPLASTY RIGHT on 08/11/2017   Consultants (if any):   Discharged Condition: Improved  Hospital Course: [name]. is an 62 y.o. male who was admitted 08/11/2017 with a diagnosis of Right rotator cuff tear arthropathy and went to the operating room on 08/11/2017 and underwent the above named procedures.    He was given perioperative antibiotics:  Anti-infectives (From admission, onward)   Start     Dose/Rate Route Frequency Ordered Stop   08/11/17 1800  ceFAZolin (ANCEF) IVPB 1 g/50 mL premix     1 g 100 mL/hr over 30 Minutes Intravenous Every 6 hours 08/11/17 1749 08/12/17 0526   08/11/17 1215  ceFAZolin (ANCEF) IVPB 2g/100 mL premix     2 g 200 mL/hr over 30 Minutes Intravenous On call to O.R. 08/11/17 1203 08/11/17 1417    .  He was given sequential compression devices, early ambulation, for DVT prophylaxis.  He benefited maximally from the hospital stay and there were no complications.    Recent vital signs:  Vitals:   08/12/17 0400 08/12/17 0738  BP: 107/79 (!) 161/79  Pulse: 97 (!) 104  Resp: 20 20  Temp: 98.2 F (36.8 C) 97.8  F (36.6 C)  SpO2: 95% 94%    Recent laboratory studies:  Lab Results  Component Value Date   HGB 12.9 (L) 08/12/2017   HGB 15.8 07/31/2017   HGB 16.3 06/29/2017   Lab Results  Component Value Date   WBC 17.0 (H) 08/12/2017   PLT 256 08/12/2017   No results found for: INR Lab Results  Component Value Date   NA 138 08/12/2017   K 4.0 08/12/2017   CL 106 08/12/2017   CO2 21 (L) 08/12/2017   BUN 14 08/12/2017   CREATININE 0.95 08/12/2017   GLUCOSE 156 (H) 08/12/2017    Discharge Medications:   Allergies as of 08/12/2017      Reactions   Bee Venom Swelling   SWELLING REACTION UNSPECIFIED    Penicillins Other (See Comments)   UNSPECIFIED REACTION of CHILDHOOD Has patient had a PCN reaction causing immediate rash, facial/tongue/throat swelling, SOB or lightheadedness with hypotension: Unknown Has patient had a PCN reaction causing severe rash involving mucus membranes or skin necrosis: Unknown Has patient had a PCN reaction that required hospitalization: Unknown Has patient had a PCN reaction occurring within the last 10 years: No If all of the above answers are "NO", then may proceed with Cephalosporin use.      Medication List    TAKE these medications   cyanocobalamin 1000 MCG/ML injection Commonly known as:  (VITAMIN B-12) Inject 1,000 mcg into the muscle  every 14 (fourteen) days.   diazepam 5 MG tablet Commonly known as:  VALIUM Take 5 mg by mouth every 6 (six) hours as needed for muscle spasms.   diclofenac sodium 1 % Gel Commonly known as:  VOLTAREN Apply 1 application topically daily as needed (pain).   diphenhydrAMINE 25 MG tablet Commonly known as:  SOMINEX Take 50 mg by mouth at bedtime.   EPINEPHrine 0.3 mg/0.3 mL Soaj injection Commonly known as:  EPI-PEN Inject 0.3 mg into the muscle once.   Fish Oil 1200 MG Caps Take 1,200 mg by mouth daily.   HYDROmorphone 2 MG tablet Commonly known as:  DILAUDID Take 1 tablet (2 mg total) by mouth every  4 (four) hours as needed for severe pain.   hydrOXYzine 25 MG capsule Commonly known as:  VISTARIL Take 25 mg by mouth daily as needed for anxiety.   losartan 100 MG tablet Commonly known as:  COZAAR Take 100 mg by mouth daily with supper.   LUBRICATING EYE DROPS OP Place 1 drop into both eyes daily as needed (dry eyes).   methylphenidate 10 MG tablet Commonly known as:  RITALIN Take 2.5-5 mg by mouth daily as needed (focus).   multivitamin tablet Take 1 tablet by mouth daily.   Oxycodone HCl 10 MG Tabs Take 10 mg by mouth every 4 (four) hours as needed (pain).   PRESCRIPTION MEDICATION Apply 1 application topically 2 (two) times daily as needed (joint pain). Ketoprofen 10% Baclofen 2% Lidocaine 5%   tiZANidine 4 MG tablet Commonly known as:  ZANAFLEX Take 4-6 mg by mouth See admin instructions. Take 4 mg by mouth 3 times daily. Take 6 mg by mouth at bedtime       Diagnostic Studies: Dg Shoulder Right Port  Result Date: 08/11/2017 CLINICAL DATA:  Status post right shoulder arthroplasty. EXAM: PORTABLE RIGHT SHOULDER COMPARISON:  None. FINDINGS: The glenoid and humeral components are well situated. No fracture or dislocation is noted. IMPRESSION: Status post right shoulder arthroplasty. Electronically Signed   By: Lupita Raider, M.D.   On: 08/11/2017 19:59    Disposition:     Follow-up Information    Teryl Lucy, MD. Schedule an appointment as soon as possible for a visit in 2 weeks.   Specialty:  Orthopedic Surgery Contact information: [address] [place] [address] [phone]            Signed: Eulas Post 08/12/2017, 8:16 AM

## 2017-08-13 ENCOUNTER — Encounter (HOSPITAL_COMMUNITY): Payer: Self-pay | Admitting: Orthopedic Surgery

## 2019-01-04 ENCOUNTER — Other Ambulatory Visit: Payer: Self-pay

## 2019-01-04 ENCOUNTER — Telehealth: Payer: Self-pay | Admitting: Neurology

## 2019-01-04 ENCOUNTER — Encounter: Payer: Self-pay | Admitting: *Deleted

## 2019-01-04 ENCOUNTER — Ambulatory Visit (INDEPENDENT_AMBULATORY_CARE_PROVIDER_SITE_OTHER): Payer: Medicare HMO | Admitting: Neurology

## 2019-01-04 ENCOUNTER — Encounter: Payer: Self-pay | Admitting: Neurology

## 2019-01-04 VITALS — BP 116/80 | HR 81 | Temp 97.7°F | Ht 69.0 in | Wt 194.5 lb

## 2019-01-04 DIAGNOSIS — R519 Headache, unspecified: Secondary | ICD-10-CM | POA: Insufficient documentation

## 2019-01-04 DIAGNOSIS — R51 Headache: Secondary | ICD-10-CM | POA: Diagnosis not present

## 2019-01-04 DIAGNOSIS — M542 Cervicalgia: Secondary | ICD-10-CM

## 2019-01-04 MED ORDER — TOPIRAMATE 100 MG PO TABS: 100.0000 mg | ORAL_TABLET | Freq: Two times a day (BID) | ORAL | 11 refills | Status: AC

## 2019-01-04 NOTE — Progress Notes (Signed)
PATIENT: [name]. DOB: 1955/08/26  Chief Complaint  Patient presents with  . Migraine    He is here with his wife, [name].  He has some type of headache nearly every day.  He sparingly takes Tylenol but it causes him GI problems.  He also uses CBD oil.   Marland Kitchen PCP    Joseph Art, MD     HISTORICAL  [name]. is a 63 year old male, seen in request by his primary care physician Dr. Marjory Lies, Caren T for evaluation of chronic migraine, he is accompanied by his wife [name] at today's clinic visit January 04, 2019.   I have reviewed and summarized the referring note from the referring physician.  Medical history of hypertension, chronic low back pain, has been under Dr. Hendricks Milo for over 10 years, currently taking oxycodone 10 mg 6 tablets a day, also Valium every 6 hours, the prescriptions from his pain management  Today his main concern is chronic headache, this has been ongoing since 2011, starting from occipital region, spreading forward, he had cervical decompression surgery around 2017 by Dr. Cyndy Freeze, which helped his headache temporarily, he also reported significant improvement of his headache following 1 trigger point injection prior to surgery in 2017  Since 2018, he began to have recurrent headaches again, usually stemming from right upper shoulder, cervical region occipital area, spreading forward, with associated light noise sensitivity, nauseous, pounding headaches, movement make it worse, she complains of daily moderate to severe headaches, despite multiple doses of oxycodone, and Valium tizanidine combination  He continues to complain of low back pain, radiating pain to lower extremity,  I personally reviewed MRI of cervical spine in October 2018, evidence of previous ACDF C5-6, no evidence of canal or foraminal narrowing   REVIEW OF SYSTEMS: Full 14 system review of systems performed and notable only for as above All other review of systems were  negative.  ALLERGIES: Allergies  Allergen Reactions  . Bee Venom Swelling    SWELLING REACTION UNSPECIFIED   . Penicillins Other (See Comments)    UNSPECIFIED REACTION of CHILDHOOD Has patient had a PCN reaction causing immediate rash, facial/tongue/throat swelling, SOB or lightheadedness with hypotension: Unknown Has patient had a PCN reaction causing severe rash involving mucus membranes or skin necrosis: Unknown Has patient had a PCN reaction that required hospitalization: Unknown Has patient had a PCN reaction occurring within the last 10 years: No If all of the above answers are "NO", then may proceed with Cephalosporin use.     HOME MEDICATIONS: Current Outpatient Medications  Medication Sig Dispense Refill  . cyanocobalamin (,VITAMIN B-12,) 1000 MCG/ML injection Inject 1,000 mcg into the muscle every 14 (fourteen) days.  3  . diazepam (VALIUM) 5 MG tablet Take 5 mg by mouth every 6 (six) hours as needed for muscle spasms.     . diclofenac sodium (VOLTAREN) 1 % GEL Apply 1 application topically daily as needed (pain).    Marland Kitchen diltiazem (CARDIZEM CD) 120 MG 24 hr capsule Take 1 capsule by mouth daily.    . diphenhydrAMINE (SOMINEX) 25 MG tablet Take 50 mg by mouth at bedtime.     Marland Kitchen EPINEPHrine 0.3 mg/0.3 mL IJ SOAJ injection Inject 0.3 mg into the muscle once.     . hydrOXYzine (VISTARIL) 25 MG capsule Take 25 mg by mouth daily as needed for anxiety.  3  . losartan (COZAAR) 50 MG tablet Take 50 mg by mouth daily.    . methylphenidate (RITALIN)  10 MG tablet Take 2.5-5 mg by mouth daily as needed (focus).    . Multiple Vitamin (MULTIVITAMIN) tablet Take 1 tablet by mouth daily.    . Oxycodone HCl 10 MG TABS Take 10 mg by mouth every 4 (four) hours as needed (pain).    Marland Kitchen PRESCRIPTION MEDICATION Apply 1 application topically 2 (two) times daily as needed (joint pain). Ketoprofen 10% Baclofen 2% Lidocaine 5%    . tiZANidine (ZANAFLEX) 4 MG tablet Take 6 mg by mouth at bedtime.     Marland Kitchen  UNABLE TO FIND Med Name: CBD oil prn     No current facility-administered medications for this visit.     PAST MEDICAL HISTORY: Past Medical History:  Diagnosis Date  . ADHD (attention deficit hyperactivity disorder)   . Arthritis    neck, back  . Chronic lower back pain   . Fibromyalgia   . GERD (gastroesophageal reflux disease)    TUMS as needed  . Headache   . Hearing loss   . History of hiatal hernia   . Hypertension   . Low back pain   . Neck pain   . OA (osteoarthritis)   . Pinched cervical nerve root   . Rheumatoid arthritis(714.0)    hands  . Right rotator cuff tear arthropathy 08/11/2017  . Rotator cuff tear, left 05/2011  . Tinnitus     PAST SURGICAL HISTORY: Past Surgical History:  Procedure Laterality Date  . CARDIAC CATHETERIZATION     20 yrs ago x2  . CARPAL TUNNEL RELEASE  06/04/2006; 06/15/2008   left; right  . CERVICAL SPINE SURGERY    . COLONOSCOPY    . 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 ESOPHAGOGASTRODUODENOSCOPY    . REVERSE SHOULDER ARTHROPLASTY Right 08/11/2017   Procedure: REVERSE SHOULDER ARTHROPLASTY RIGHT;  Surgeon: Marchia Bond, MD;  Location: [facility];  Service: Orthopedics;  Laterality: Right;  . SHOULDER SURGERY     right and left    FAMILY HISTORY: Family History  Problem Relation Age of Onset  . Cancer Mother        unknown origin  . Heart failure Father     SOCIAL HISTORY: Social History   Socioeconomic History  . Marital status: Married    Spouse name: Not on file  . Number of children: 0  . Years of education: some college  . Highest education level: Not on file  Occupational History  . Occupation: Disabled    Comment: use to work Barista  . Financial resource strain: Not on file  . Food insecurity    Worry: Not on file    Inability: Not on file  . Transportation needs    Medical: Not on file    Non-medical: Not on file  Tobacco  Use  . Smoking status: Former Smoker    Packs/day: 2.00    Years: 15.00    Pack years: 30.00    Types: Cigarettes  . Smokeless tobacco: Never Used  Substance and Sexual Activity  . Alcohol use: No    Frequency: Never  . Drug use: Yes    Types: Marijuana    Comment: 1-2x daily  . Sexual activity: Not on file  Lifestyle  . Physical activity    Days per week: Not on file    Minutes per session: Not on file  . Stress: Not on file  Relationships  . Social connections    Talks  on phone: Not on file    Gets together: Not on file    Attends religious service: Not on file    Active member of club or organization: Not on file    Attends meetings of clubs or organizations: Not on file    Relationship status: Not on file  . Intimate partner violence    Fear of current or ex partner: Not on file    Emotionally abused: Not on file    Physically abused: Not on file    Forced sexual activity: Not on file  Other Topics Concern  . Not on file  Social History Narrative   Lives at home with his wife.   Right-handed.   One cup caffeine per day.      PHYSICAL EXAM   Vitals:   01/04/19 1009  BP: 116/80  Pulse: 81  Temp: 97.7 F (36.5 C)  Weight: 194 lb 8 oz (88.2 kg)  Height: 5' 9"  (1.753 m)    Not recorded      Body mass index is 28.72 kg/m.  PHYSICAL EXAMNIATION:  Gen: NAD, conversant, well nourised, obese, well groomed                     Cardiovascular: Regular rate rhythm, no peripheral edema, warm, nontender. Eyes: Conjunctivae clear without exudates or hemorrhage Neck: Supple, no carotid bruits. Pulmonary: Clear to auscultation bilaterally   NEUROLOGICAL EXAM:  MENTAL STATUS: Speech:    Speech is normal; fluent and spontaneous with normal comprehension.  Cognition:     Orientation to time, place and person     Normal recent and remote memory     Normal Attention span and concentration     Normal Language, naming, repeating,spontaneous speech     Fund of  knowledge   CRANIAL NERVES: CN II: Visual fields are full to confrontation. Pupils are round equal and briskly reactive to light. CN III, IV, VI: extraocular movement are normal. No ptosis. CN V: Facial sensation is intact to pinprick in all 3 divisions bilaterally. Corneal responses are intact.  CN VII: Face is symmetric with normal eye closure and smile. CN VIII: Hearing is normal to rubbing fingers CN IX, X: Palate elevates symmetrically. Phonation is normal. CN XI: Head turning and shoulder shrug are intact CN XII: Tongue is midline with normal movements and no atrophy.  MOTOR: There is no pronator drift of out-stretched arms. Muscle bulk and tone are normal. Muscle strength is normal.  REFLEXES: Reflexes are 2+ and symmetric at the biceps, triceps, knees, and ankles. Plantar responses are flexor.  SENSORY: Intact to light touch, pinprick, positional sensation and vibratory sensation are intact in fingers and toes.  COORDINATION: Rapid alternating movements and fine finger movements are intact. There is no dysmetria on finger-to-nose and heel-knee-shin.    GAIT/STANCE: He needs push-up to get up from seated position, antalgic mildly unsteady  DIAGNOSTIC DATA (LABS, IMAGING, TESTING) - I reviewed patient records, labs, notes, testing and imaging myself where available.   ASSESSMENT AND PLAN  Shahab Polhamus. is a 63 y.o. 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 Ph.D.  [facility] [address] [place], [place]  [address] Ph: [phone] Fax: [phone]  CC: Joseph Art, MD

## 2019-01-04 NOTE — Telephone Encounter (Signed)
[employer] medicare order sent to [facility]. They will obtain the auth and reach out to the patient to schedule.  °

## 2019-01-05 ENCOUNTER — Telehealth: Payer: Self-pay | Admitting: *Deleted

## 2019-01-05 LAB — COMPREHENSIVE METABOLIC PANEL
ALT: 20 IU/L (ref 0–44)
AST: 17 IU/L (ref 0–40)
Albumin/Globulin Ratio: 1.9 (ref 1.2–2.2)
Albumin: 4.7 g/dL (ref 3.8–4.8)
Alkaline Phosphatase: 91 IU/L (ref 39–117)
BUN/Creatinine Ratio: 11 (ref 10–24)
BUN: 10 mg/dL (ref 8–27)
Bilirubin Total: 0.3 mg/dL (ref 0.0–1.2)
CO2: 21 mmol/L (ref 20–29)
Calcium: 9.8 mg/dL (ref 8.6–10.2)
Chloride: 102 mmol/L (ref 96–106)
Creatinine, Ser: 0.9 mg/dL (ref 0.76–1.27)
GFR calc Af Amer: 106 mL/min/{1.73_m2} (ref >59)
GFR calc non Af Amer: 92 mL/min/{1.73_m2} (ref >59)
Globulin, Total: 2.5 g/dL (ref 1.5–4.5)
Glucose: 99 mg/dL (ref 65–99)
Potassium: 4.8 mmol/L (ref 3.5–5.2)
Sodium: 143 mmol/L (ref 134–144)
Total Protein: 7.2 g/dL (ref 6.0–8.5)

## 2019-01-05 LAB — CBC WITH DIFFERENTIAL
Basophils Absolute: 0.1 10*3/uL (ref 0.0–0.2)
Basos: 1 %
EOS (ABSOLUTE): 0.2 10*3/uL (ref 0.0–0.4)
Eos: 2 %
Hematocrit: 43.4 % (ref 37.5–51.0)
Hemoglobin: 14.8 g/dL (ref 13.0–17.7)
Immature Grans (Abs): 0 10*3/uL (ref 0.0–0.1)
Immature Granulocytes: 0 %
Lymphocytes Absolute: 2.3 10*3/uL (ref 0.7–3.1)
Lymphs: 30 %
MCH: 31.6 pg (ref 26.6–33.0)
MCHC: 34.1 g/dL (ref 31.5–35.7)
MCV: 93 fL (ref 79–97)
Monocytes Absolute: 0.8 10*3/uL (ref 0.1–0.9)
Monocytes: 11 %
Neutrophils Absolute: 4.3 10*3/uL (ref 1.4–7.0)
Neutrophils: 56 %
RBC: 4.69 x10E6/uL (ref 4.14–5.80)
RDW: 13.1 % (ref 11.6–15.4)
WBC: 7.6 10*3/uL (ref 3.4–10.8)

## 2019-01-05 LAB — VITAMIN B12: Vitamin B-12: 1563 pg/mL — ABNORMAL HIGH (ref 232–1245)

## 2019-01-05 LAB — C-REACTIVE PROTEIN: CRP: 3 mg/L (ref 0–10)

## 2019-01-05 LAB — SEDIMENTATION RATE: Sed Rate: 20 mm/hr (ref 0–30)

## 2019-01-05 LAB — TSH: TSH: 1.88 u[IU]/mL (ref 0.450–4.500)

## 2019-01-05 NOTE — Telephone Encounter (Signed)
Left message letting him know his lab work is normal.  Provided our number to call back with any questions.

## 2019-01-05 NOTE — Telephone Encounter (Signed)
-----   Message from Marcial Pacas, MD sent at 01/05/2019  2:51 PM EDT ----- Please call patient for normal laboratory result

## 2019-03-06 NOTE — Progress Notes (Deleted)
PATIENT: [name]. DOB: 09-Apr-1956  REASON FOR VISIT: follow up HISTORY FROM: patient  HISTORY OF PRESENT ILLNESS: Today 03/06/19  HISTORY  [name]. is a 63 year old male, seen in request by his primary care physician Dr. Clifton Custard, Caren T for evaluation of chronic migraine, he is accompanied by his wife [name] at today's clinic visit January 04, 2019.   I have reviewed and summarized the referring note from the referring physician.  Medical history of hypertension, chronic low back pain, has been under Dr. Verdell Carmine for over 10 years, currently taking oxycodone 10 mg 6 tablets a day, also Valium every 6 hours, the prescriptions from his pain management  Today his main concern is chronic headache, this has been ongoing since 2011, starting from occipital region, spreading forward, he had cervical decompression surgery around 2017 by Dr. Bevely Palmer, which helped his headache temporarily, he also reported significant improvement of his headache following 1 trigger point injection prior to surgery in 2017  Since 2018, he began to have recurrent headaches again, usually stemming from right upper shoulder, cervical region occipital area, spreading forward, with associated light noise sensitivity, nauseous, pounding headaches, movement make it worse, she complains of daily moderate to severe headaches, despite multiple doses of oxycodone, and Valium tizanidine combination  He continues to complain of low back pain, radiating pain to lower extremity,  I personally reviewed MRI of cervical spine in October 2018, evidence of previous ACDF C5-6, no evidence of canal or foraminal narrowing  REVIEW OF SYSTEMS: Out of a complete 14 system review of symptoms, the patient complains only of the following symptoms, and all other reviewed systems are negative.  ALLERGIES: Allergies  Allergen Reactions   Bee Venom Swelling    SWELLING REACTION UNSPECIFIED    Penicillins Other (See  Comments)    UNSPECIFIED REACTION of CHILDHOOD Has patient had a PCN reaction causing immediate rash, facial/tongue/throat swelling, SOB or lightheadedness with hypotension: Unknown Has patient had a PCN reaction causing severe rash involving mucus membranes or skin necrosis: Unknown Has patient had a PCN reaction that required hospitalization: Unknown Has patient had a PCN reaction occurring within the last 10 years: No If all of the above answers are "NO", then may proceed with Cephalosporin use.     HOME MEDICATIONS: Outpatient Medications Prior to Visit  Medication Sig Dispense Refill   cyanocobalamin (,VITAMIN B-12,) 1000 MCG/ML injection Inject 1,000 mcg into the muscle every 14 (fourteen) days.  3   diazepam (VALIUM) 5 MG tablet Take 5 mg by mouth every 6 (six) hours as needed for muscle spasms.      diclofenac sodium (VOLTAREN) 1 % GEL Apply 1 application topically daily as needed (pain).     diltiazem (CARDIZEM CD) 120 MG 24 hr capsule Take 1 capsule by mouth daily.     diphenhydrAMINE (SOMINEX) 25 MG tablet Take 50 mg by mouth at bedtime.      EPINEPHrine 0.3 mg/0.3 mL IJ SOAJ injection Inject 0.3 mg into the muscle once.      hydrOXYzine (VISTARIL) 25 MG capsule Take 25 mg by mouth daily as needed for anxiety.  3   losartan (COZAAR) 50 MG tablet Take 50 mg by mouth daily.     methylphenidate (RITALIN) 10 MG tablet Take 2.5-5 mg by mouth daily as needed (focus).     Multiple Vitamin (MULTIVITAMIN) tablet Take 1 tablet by mouth daily.     Oxycodone HCl 10 MG TABS Take 10 mg by mouth  every 4 (four) hours as needed (pain).     PRESCRIPTION MEDICATION Apply 1 application topically 2 (two) times daily as needed (joint pain). Ketoprofen 10% Baclofen 2% Lidocaine 5%     tiZANidine (ZANAFLEX) 4 MG tablet Take 6 mg by mouth at bedtime.      topiramate (TOPAMAX) 100 MG tablet Take 1 tablet (100 mg total) by mouth 2 (two) times daily. 60 tablet 11   UNABLE TO FIND Med Name:  CBD oil prn     No facility-administered medications prior to visit.     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   Rotator cuff tear, left 05/2011   Tinnitus     PAST SURGICAL HISTORY: Past Surgical History:  Procedure Laterality Date   CARDIAC CATHETERIZATION     20 yrs ago x2   CARPAL TUNNEL RELEASE  06/04/2006; 06/15/2008   left; right   CERVICAL SPINE SURGERY     COLONOSCOPY     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); 06/15/2008(right)   left hand x 2; right hand x 1   ESOPHAGOGASTRODUODENOSCOPY     REVERSE SHOULDER ARTHROPLASTY Right 08/11/2017   Procedure: REVERSE SHOULDER ARTHROPLASTY RIGHT;  Surgeon: Marchia Bond, MD;  Location: [facility];  Service: Orthopedics;  Laterality: Right;   SHOULDER SURGERY     right and left    FAMILY HISTORY: Family History  Problem Relation Age of Onset   Cancer Mother        unknown origin   Heart failure Father     SOCIAL HISTORY: Social History   Socioeconomic History   Marital status: Married    Spouse name: Not on file   Number of children: 0   Years of education: some college   Highest education level: Not on file  Occupational History   Occupation: Disabled    Comment: use to work Programme researcher, broadcasting/film/video strain: Not on file   Food insecurity    Worry: Not on file    Inability: Not on Lexicographer needs    Medical: Not on file    Non-medical: Not on file  Tobacco Use   Smoking status: Former  Smoker    Packs/day: 2.00    Years: 15.00    Pack years: 30.00    Types: Cigarettes   Smokeless tobacco: Never Used  Substance and Sexual Activity   Alcohol use: No    Frequency: Never   Drug use: Yes    Types: Marijuana    Comment: 1-2x daily   Sexual activity: Not on file  Lifestyle   Physical activity    Days per week: Not on file    Minutes per session: Not on file   Stress: Not on file  Relationships   Social connections    Talks on phone: Not on file    Gets together: Not on file    Attends religious service:  Not on file    Active member of club or organization: Not on file    Attends meetings of clubs or organizations: Not on file    Relationship status: Not on file   Intimate partner violence    Fear of current or ex partner: Not on file    Emotionally abused: Not on file    Physically abused: Not on file    Forced sexual activity: Not on file  Other Topics Concern   Not on file  Social History Narrative   Lives at home with his wife.   Right-handed.   One cup caffeine per day.       PHYSICAL EXAM  There were no vitals filed for this visit. There is no height or weight on file to calculate BMI.  Generalized: Well developed, in no acute distress   Neurological examination  Mentation: Alert oriented to time, place, history taking. Follows all commands speech and language fluent Cranial nerve II-XII: Pupils were equal round reactive to light. Extraocular movements were full, visual field were full on confrontational test. Facial sensation and strength were normal. Uvula tongue midline. Head turning and shoulder shrug  were normal and symmetric. Motor: The motor testing reveals 5 over 5 strength of all 4 extremities. Good symmetric motor tone is noted throughout.  Sensory: Sensory testing is intact to soft touch on all 4 extremities. No evidence of extinction is noted.  Coordination: Cerebellar testing reveals good finger-nose-finger and heel-to-shin  bilaterally.  Gait and station: Gait is normal. Tandem gait is normal. Romberg is negative. No drift is seen.  Reflexes: Deep tendon reflexes are symmetric and normal bilaterally.   DIAGNOSTIC DATA (LABS, IMAGING, TESTING) - I reviewed patient records, labs, notes, testing and imaging myself where available.  Lab Results  Component Value Date   WBC 7.6 01/04/2019   HGB 14.8 01/04/2019   HCT 43.4 01/04/2019   MCV 93 01/04/2019   PLT 256 08/12/2017      Component Value Date/Time   NA 143 01/04/2019 1119   K 4.8 01/04/2019 1119   CL 102 01/04/2019 1119   CO2 21 01/04/2019 1119   GLUCOSE 99 01/04/2019 1119   GLUCOSE 156 (H) 08/12/2017 0554   BUN 10 01/04/2019 1119   CREATININE 0.90 01/04/2019 1119   CALCIUM 9.8 01/04/2019 1119   PROT 7.2 01/04/2019 1119   ALBUMIN 4.7 01/04/2019 1119   AST 17 01/04/2019 1119   ALT 20 01/04/2019 1119   ALKPHOS 91 01/04/2019 1119   BILITOT 0.3 01/04/2019 1119   GFRNONAA 92 01/04/2019 1119   GFRAA 106 01/04/2019 1119   No results found for: CHOL, HDL, LDLCALC, LDLDIRECT, TRIG, CHOLHDL No results found for: YJEH6D Lab Results  Component Value Date   VITAMINB12 1,563 (H) 01/04/2019   Lab Results  Component Value Date   TSH 1.880 01/04/2019      ASSESSMENT AND PLAN 63 y.o. year old male  has a past medical history of ADHD (attention deficit hyperactivity disorder), Arthritis, Chronic lower back pain, Fibromyalgia, GERD (gastroesophageal reflux disease), Headache, Hearing loss, History of hiatal hernia, Hypertension, Low back pain, Neck pain, OA (osteoarthritis), Pinched cervical nerve root, Rheumatoid arthritis(714.0), Right rotator cuff tear arthropathy (08/11/2017), Rotator cuff tear, left (05/2011), and Tinnitus. here with ***   I spent 15 minutes with the patient. 50% of this time was spent   Margie Ege, [place], DNP 03/06/2019, 7:02 PM [facility] [address], [address] [place], [place] [address] [phone]

## 2019-03-07 ENCOUNTER — Telehealth: Payer: Self-pay

## 2019-03-07 ENCOUNTER — Ambulatory Visit: Payer: Medicare HMO | Admitting: Neurology

## 2019-03-07 NOTE — Telephone Encounter (Signed)
Patient was a no call/no show for their appointment today.   

## 2019-03-08 ENCOUNTER — Encounter: Payer: Self-pay | Admitting: Neurology

## 2019-05-20 IMAGING — DX DG SHOULDER 2+V PORT*R*
3 series · 3 of 3 positions shown · non-contrast
Comparison: None.

CLINICAL DATA: Status post right shoulder arthroplasty.

EXAM:
PORTABLE RIGHT SHOULDER

[shoulder axial]
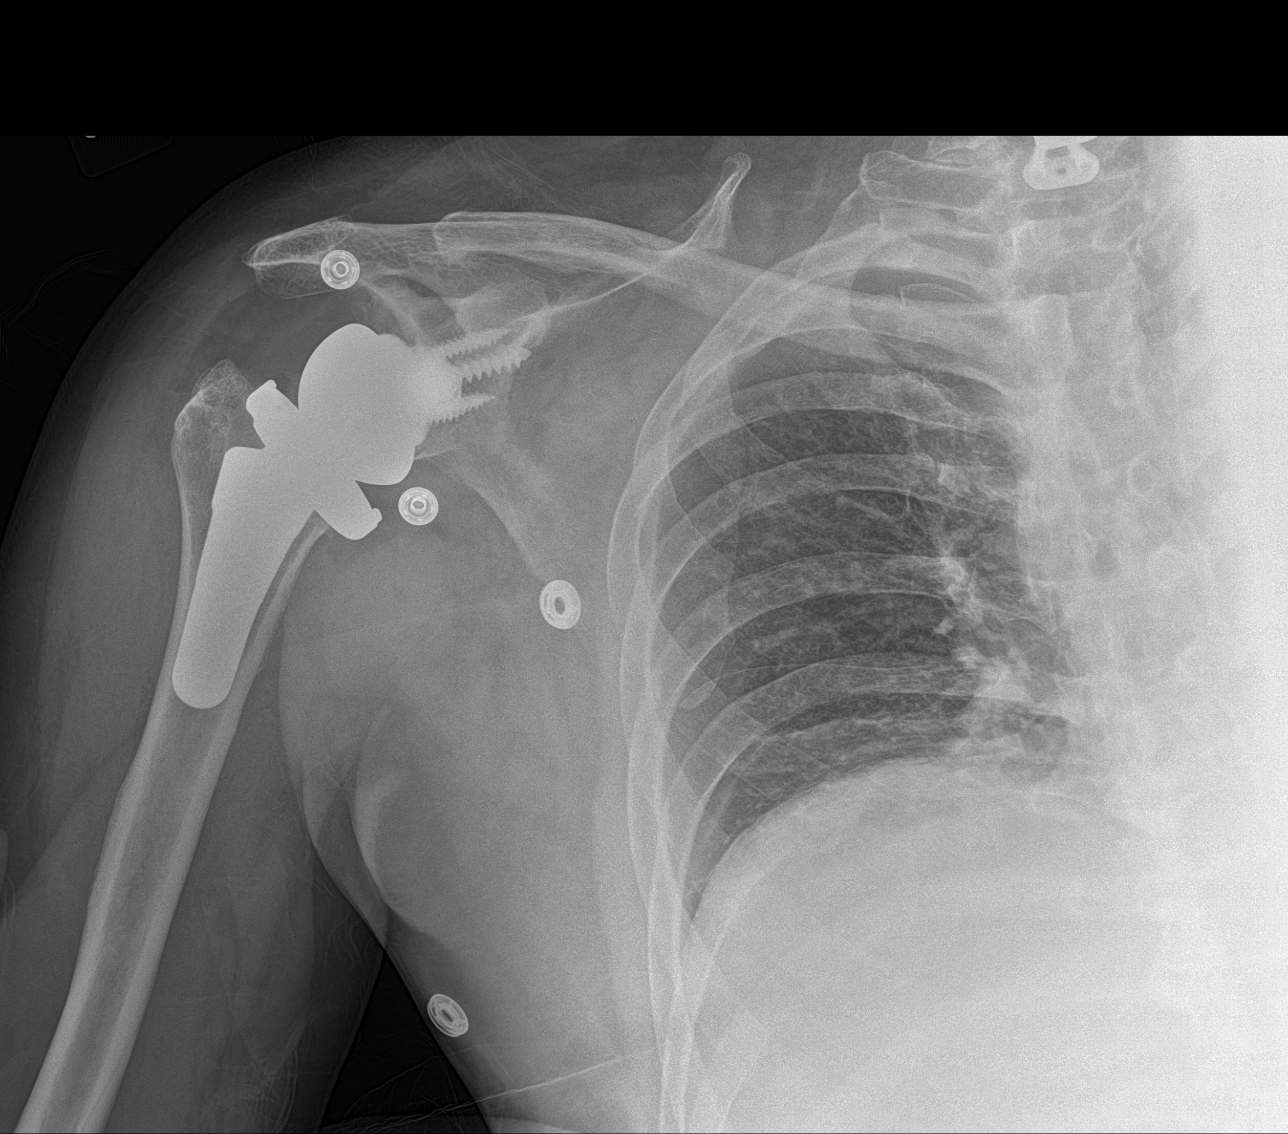

[scapula lat (1 of 2)]
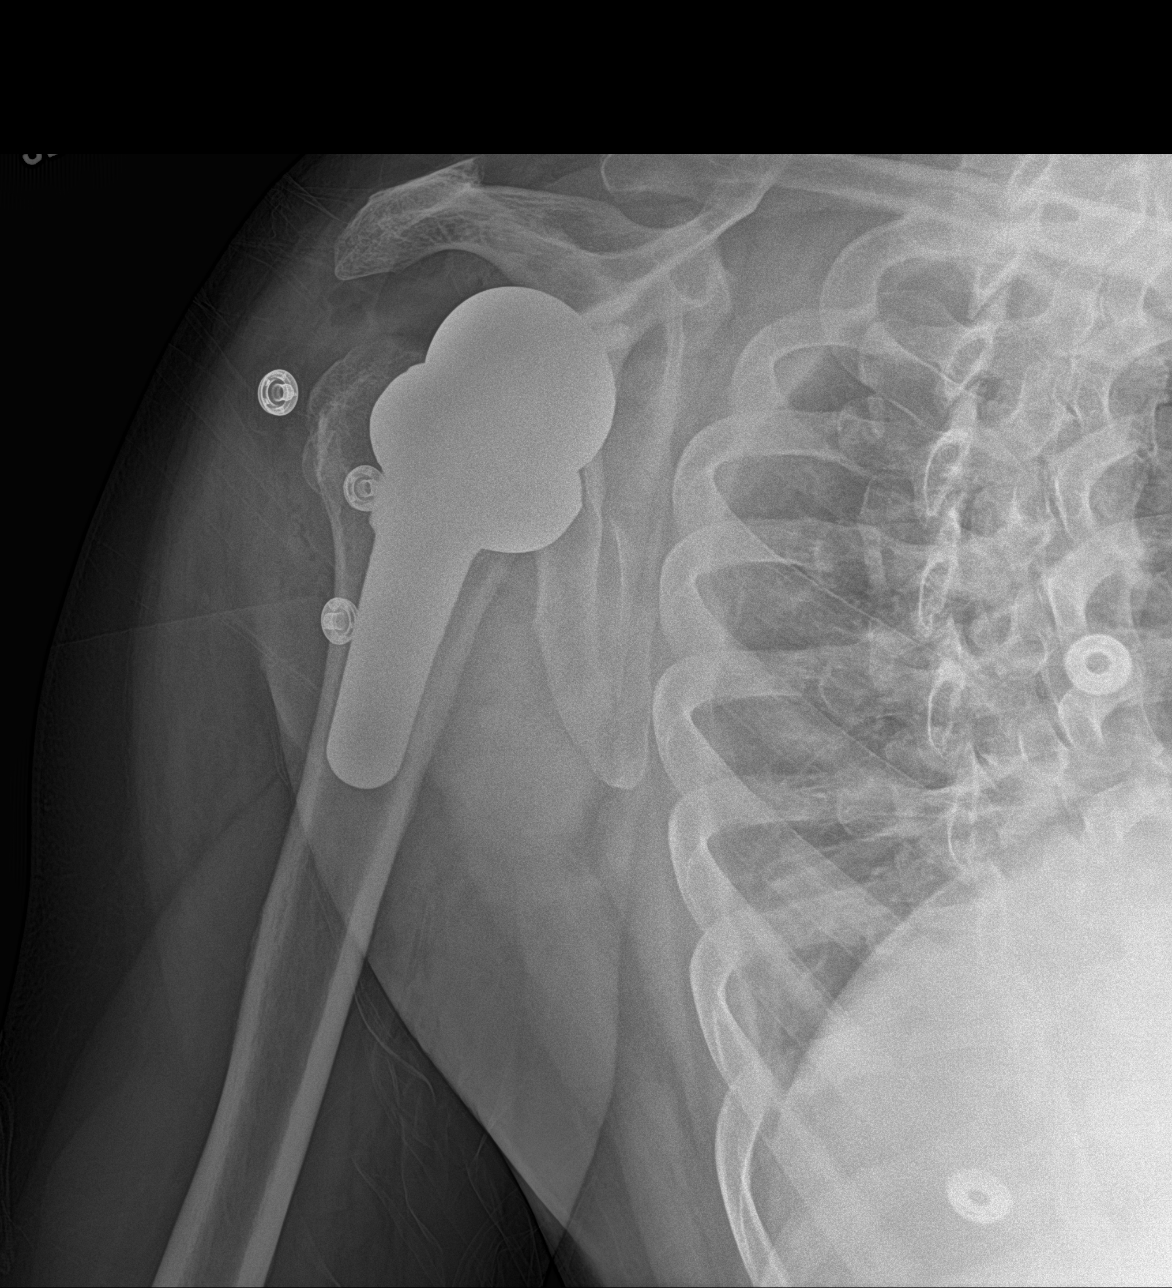

[scapula lat (2 of 2)]
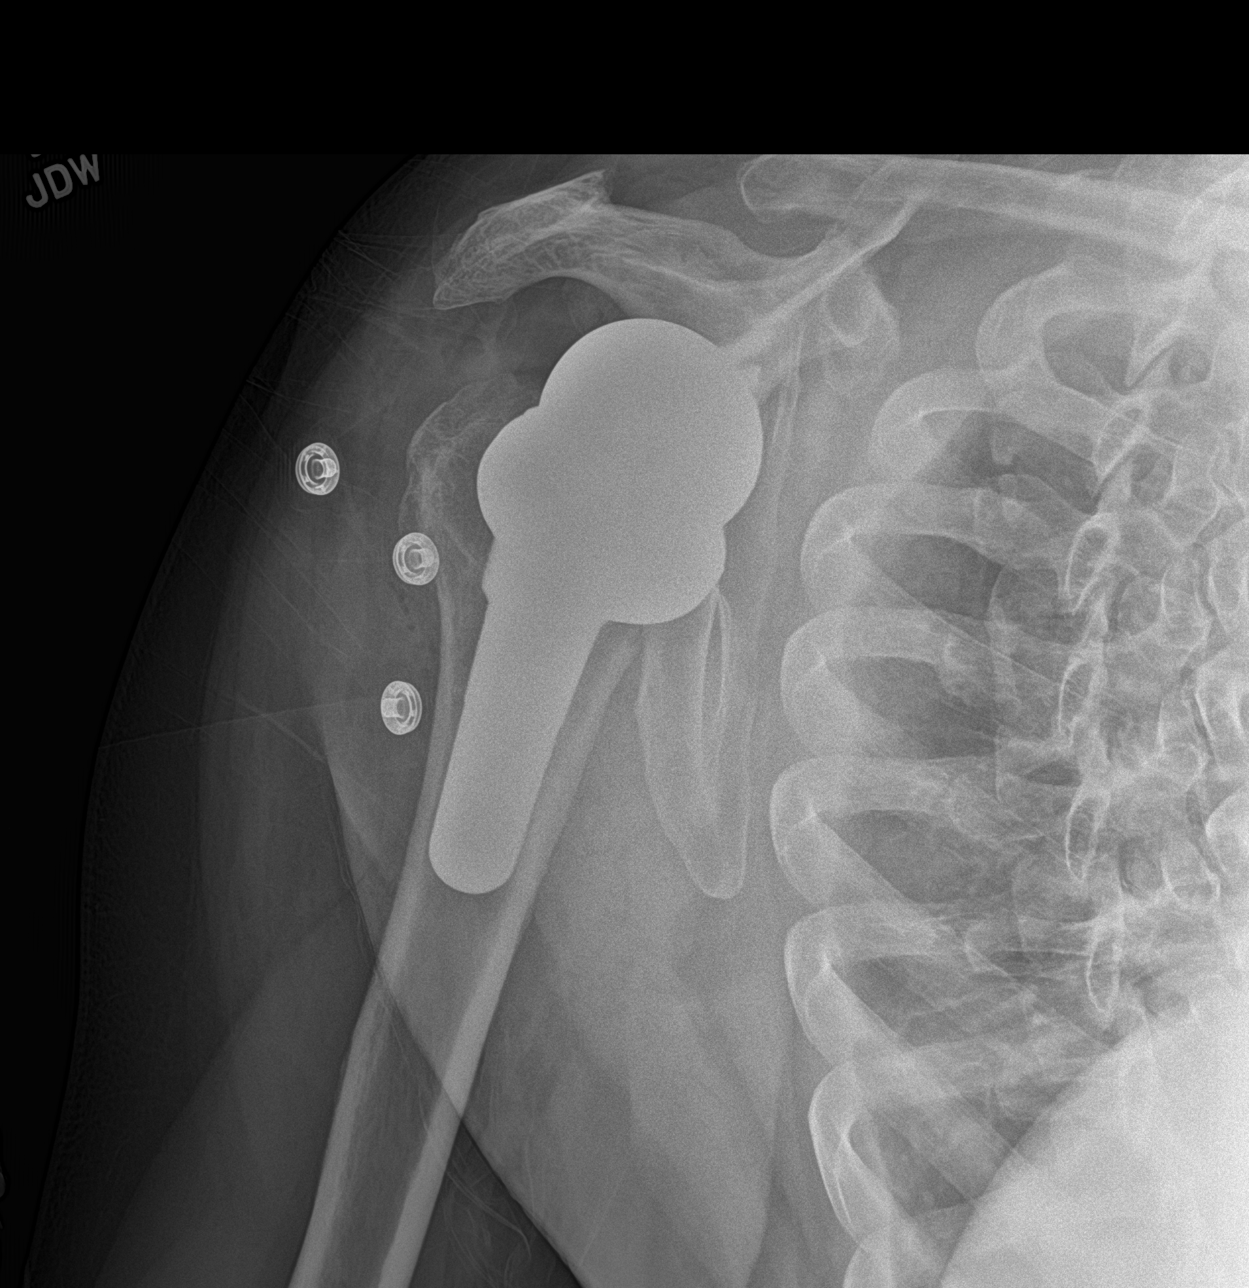

[3 of 3 positions shown; findings below may reference images not displayed]

FINDINGS: The glenoid and humeral components are well situated. No fracture or
dislocation is noted.
IMPRESSION: Status post right shoulder arthroplasty.

## 2021-08-13 NOTE — Telephone Encounter (Signed)
-----   Message from [facility] sent at 08/13/2021  4:20 PM CDT -----  Subject: Message to Provider    QUESTIONS  Information for Provider? Pt would like to know if the office will accept   [employer], he wants to get established with a new PCP and was   interested in seeing Nechama Guard.  ---------------------------------------------------------------------------  --------------  Cleotis Lema INFO  [phone]; OK to leave message on voicemail  ---------------------------------------------------------------------------  --------------  SCRIPT ANSWERS  Relationship to Patient? Self

## 2021-08-13 NOTE — Telephone Encounter (Signed)
Please advise if we take this insurance?

## 2021-08-14 NOTE — Telephone Encounter (Signed)
Called and LM that I had never heard of it, that he should be able to call his insurance and see if we are in network or call billing dept [phone] and ask them if we accept it.

## 2021-08-14 NOTE — Telephone Encounter (Signed)
-----   Message from [facility] sent at 08/13/2021  4:21 PM CDT -----  Subject: Message to Provider    QUESTIONS  Information for Provider? Pt would like to know if Dr. Beverely Pace will   accept [employer]. He is wanting to get established with a new   PCP. Call pt back about insurance.   ---------------------------------------------------------------------------  --------------  Cleotis Lema INFO  [phone]; OK to leave message on voicemail  ---------------------------------------------------------------------------  --------------  SCRIPT ANSWERS  Relationship to Patient? Self

## 2021-08-14 NOTE — Telephone Encounter (Signed)
Great! Thank you so much!

## 2022-05-22 ENCOUNTER — Encounter: Admit: 2022-05-22 | Discharge: 2022-05-22 | Payer: MEDICARE | Attending: Internal Medicine | Primary: Internal Medicine

## 2022-05-22 DIAGNOSIS — Z0000 Encounter for general adult medical examination without abnormal findings: Secondary | ICD-10-CM

## 2022-05-22 NOTE — Progress Notes (Signed)
[facility]  [address] [place], [place] [address]  Phone [phone]   Fax [phone]      OFFICE VISIT: 05/22/2022    Raymond Skinner-DOB: 1956-04-08      HPI  Reason For Visit:  Raymond Skinner is a 67 y.o.     Medicare AWV and New Patient (Denies questions or concerns/New patient to est - new to medicare )    Patient presents to establish care and also for a routine annual wellness evaluation.  Present concerns:  No new concerns.    He is not presently on any prescription medications.  He does take several herbal medications and vitamins.  We do not have any laboratory studies present in the medical record as of the time of the visit.      We did discuss his herbal regimen in detail.  He is on some OTC medications that could pose some risk factors.  We discussed ginkgo biloba, however his blood pressure is normal on examination today.  We also discussed vitamin E, and that studies show increased atherogenesis with vitamin E supplementation greater than 400 international units.  Recommendation was to back down on this.    We discussed magnesium and side effects of too much magnesium including diarrhea as well as other neuromuscular effects.    We discussed the water-soluble nature of Raymond Skinner vitamins.    We discussed the controversial aspects of aspirin supplementation.  This is based upon risk stratification.    We discussed vitamin K as well as other lipid soluble vitamins and dietary impact.  Recommendation is avoidance of saturated fats and limit this intake to less than 10% preferred less than 6%.     height is 1.829 m (6') and weight is 84.6 kg (186 lb 8 oz). His temporal temperature is 97.9 F (36.6 C). His blood pressure is 118/70 and his pulse is 52. His oxygen saturation is 96%.      Body mass index is 25.29 kg/m.    I have reviewed the following with the Raymond Skinner   Lab Review  No visits with results within 6 Month(s) from this visit.   Latest known visit with results is:   No results found for any  previous visit.     Copies of these are in the chart.    Current Outpatient Medications   Medication Sig Dispense Refill    Multiple Vitamins-Minerals (THERAPEUTIC MULTIVITAMIN-MINERALS) tablet Take 1 tablet by mouth daily      aspirin 81 MG chewable tablet Take 1 tablet by mouth daily      vitamin Raymond Skinner-6 (PYRIDOXINE) 100 MG tablet Take 1 tablet by mouth daily      Vitamin D-Vitamin K (VITAMIN K2-VITAMIN D3 PO) Take 90 mcg by mouth daily      vitamin E 1000 units capsule Take 1 capsule by mouth daily      cyanocobalamin 1000 MCG tablet Take 2.5 tablets by mouth daily      magnesium 30 MG tablet Take 250 mg by mouth 2 times daily      Potassium 99 MG TABS Take by mouth      zinc gluconate 50 MG tablet Take 1 tablet by mouth daily      Ginkgo 60 MG TABS Take by mouth      Apoaequorin (PREVAGEN) 10 MG CAPS Take by mouth       No current facility-administered medications for this visit.       Allergies: Patient has no known allergies.  Past Medical History:   Diagnosis Date    Malachi Carl infection     as a child       Family History   Problem Relation Age of Onset    Diabetes Mother     Dementia Father     No Known Problems Maternal Uncle     Dementia Paternal Aunt     Dementia Maternal Grandmother     Dementia Maternal Grandfather     Dementia Paternal Grandmother     Dementia Paternal Grandfather        No past surgical history on file.    Social History     Tobacco Use    Smoking status: Former     Current packs/day: 0.00     Types: Cigarettes     Start date: 1976     Quit date: 2019     Years since quitting: 5.0    Smokeless tobacco: Never   Substance Use Topics    Alcohol use: Not on file        Review of Systems   Constitutional: Negative.    HENT: Negative.     Eyes: Negative.    Respiratory: Negative.     Cardiovascular: Negative.    Gastrointestinal: Negative.    Endocrine: Negative.    Genitourinary: Negative.    Musculoskeletal: Negative.    Skin: Negative.    Allergic/Immunologic: Negative.     Neurological: Negative.    Hematological: Negative.    Psychiatric/Behavioral: Negative.         Physical Exam  Vitals and nursing note reviewed.   Constitutional:       General: He is not in acute distress.     Appearance: He is well-developed. He is obese.   HENT:      Head: Normocephalic and atraumatic.      Right Ear: Tympanic membrane, ear canal and external ear normal.      Left Ear: Tympanic membrane, ear canal and external ear normal.      Nose: Nose normal.      Mouth/Throat:      Mouth: Mucous membranes are moist.      Pharynx: Oropharynx is clear.   Eyes:      General: No scleral icterus.     Extraocular Movements: Extraocular movements intact.      Conjunctiva/sclera: Conjunctivae normal.      Pupils: Pupils are equal, round, and reactive to light.   Neck:      Thyroid: No thyromegaly.      Vascular: No carotid bruit or JVD.   Cardiovascular:      Rate and Rhythm: Normal rate and regular rhythm. No extrasystoles are present.     Chest Wall: PMI is not displaced.      Heart sounds: Normal heart sounds, S1 normal and S2 normal. No murmur heard.     No friction rub. No gallop.   Pulmonary:      Effort: Pulmonary effort is normal. No respiratory distress.      Breath sounds: Normal breath sounds. No wheezing, rhonchi or rales.   Abdominal:      General: Bowel sounds are normal.      Palpations: Abdomen is soft.      Tenderness: There is no abdominal tenderness. There is no guarding or rebound.   Genitourinary:     Comments: Exam deferred  Musculoskeletal:         General: No tenderness. Normal range of motion.      Cervical back:  Normal range of motion and neck supple.   Lymphadenopathy:      Cervical: No cervical adenopathy.   Skin:     General: Skin is warm and dry.      Findings: No rash.   Neurological:      General: No focal deficit present.      Mental Status: He is alert and oriented to person, place, and time.      Sensory: No sensory deficit (no numbness or tingling).   Psychiatric:         Mood  and Affect: Mood normal.         Behavior: Behavior normal.         ASSESSMENT      ICD-10-CM    1. Medicare annual wellness visit, initial  Z00.00 CBC with Auto Differential     Comprehensive Metabolic Panel     Hemoglobin A1C     Lipid Panel     Microalbumin / Creatinine Urine Ratio     T4, Free     TSH      2. Welcome to Medicare preventive visit  Z00.00             PLAN    1. Medicare annual wellness visit, initial  Routine age-appropriate anticipatory guidance was provided.  Annual wellness visit was performed in a separate note and issues were addressed as identified.     Check routine labs  - CBC with Auto Differential; Future  - Comprehensive Metabolic Panel; Future  - Hemoglobin A1C; Future  - Lipid Panel; Future  - Microalbumin / Creatinine Urine Ratio; Future  - T4, Free; Future  - TSH; Future    2. Welcome to Medicare preventive visit  Routine age-appropriate anticipatory guidance was provided.  Annual wellness visit was performed in a separate note and issues were addressed as identified.         Orders Placed This Encounter   Procedures    CBC with Auto Differential    Comprehensive Metabolic Panel    Hemoglobin A1C    Lipid Panel    Microalbumin / Creatinine Urine Ratio    T4, Free    TSH        Return in 1 year (on 05/23/2023).       This was an in-house visit            Medicare Annual Wellness Visit    SAKARI ALKHATIB is here for Medicare AWV and New Patient (Denies questions or concerns/New patient to est - new to medicare )    Assessment & Plan   Medicare annual wellness visit, initial  -     CBC with Auto Differential; Future  -     Comprehensive Metabolic Panel; Future  -     Hemoglobin A1C; Future  -     Lipid Panel; Future  -     Microalbumin / Creatinine Urine Ratio; Future  -     T4, Free; Future  -     TSH; Future  Welcome to Medicare preventive visit    Recommendations for Preventive Services Due: see orders and patient instructions/AVS.  Recommended screening schedule for the next 5-10  years is provided to the patient in written form: see Patient Instructions/AVS.     Return in 1 year (on 05/23/2023).     Subjective   The following acute and/or chronic problems were also addressed today:    Patient's complete Health Risk Assessment and screening values have been reviewed and  are found in [place]. The following problems were reviewed today and where indicated follow up appointments were made and/or referrals ordered.    Positive Risk Factor Screenings with Interventions:                 Dentist Screen:  Have you seen the dentist within the past year?: (!) No    Intervention:  Advised to schedule with their dentist     Vision Screen:  Do you have difficulty driving, watching TV, or doing any of your daily activities because of your eyesight?: No  Have you had an eye exam within the past year?: (!) No  No results found.    Interventions:   Patient encouraged to make appointment with their eye specialist              Objective   Vitals:    05/22/22 0842   BP: 118/70   Site: Right Upper Arm   Position: Sitting   Cuff Size: Large Adult   Pulse: 52   Temp: 97.9 F (36.6 C)   TempSrc: Temporal   SpO2: 96%   Weight: 84.6 kg (186 lb 8 oz)   Height: 1.829 m (6')      Body mass index is 25.29 kg/m.      Physical exam is documented elsewhere in a separate note      No Known Allergies  Prior to Visit Medications    Medication Sig Taking? Authorizing Provider   Multiple Vitamins-Minerals (THERAPEUTIC MULTIVITAMIN-MINERALS) tablet Take 1 tablet by mouth daily Yes [provider]   aspirin 81 MG chewable tablet Take 1 tablet by mouth daily Yes [provider]   vitamin Monigue Spraggins-6 (PYRIDOXINE) 100 MG tablet Take 1 tablet by mouth daily Yes [provider]   Vitamin D-Vitamin K (VITAMIN K2-VITAMIN D3 PO) Take 90 mcg by mouth daily Yes [provider]   vitamin E 1000 units capsule Take 1 capsule by mouth daily Yes [provider]   cyanocobalamin 1000 MCG tablet Take 2.5  tablets by mouth daily Yes [provider]   magnesium 30 MG tablet Take 250 mg by mouth 2 times daily Yes [provider]   Potassium 99 MG TABS Take by mouth Yes [provider]   zinc gluconate 50 MG tablet Take 1 tablet by mouth daily Yes [provider]   Ginkgo 60 MG TABS Take by mouth Yes [provider]   Apoaequorin (PREVAGEN) 10 MG CAPS Take by mouth Yes [provider]       CareTeam (Including outside providers/suppliers regularly involved in providing care):   Patient Care Team:  Francesca Oman, DO as PCP - General (Pediatrics)  Francesca Oman, DO as PCP - Empaneled Provider     Reviewed and updated this visit:  Allergies          This was an in-house visit.

## 2022-05-22 NOTE — Patient Instructions (Signed)
Learning About Dental Care for Older Adults  Dental care for older adults: Overview  Dental care for older people is much the same as for younger adults. But older adults do have concerns that younger adults do not. Older adults may have problems with gum disease and decay on the roots of their teeth. They may need missing teeth replaced or broken fillings fixed. Or they may have dentures that need to be cared for. Some older adults may have trouble holding a toothbrush.  You can help remind the person you are caring for to brush and floss their teeth or to clean their dentures. In some cases, you may need to do the brushing and other dental care tasks. People who have trouble using their hands or who have dementia may need this extra help.  How can you help with dental care?  Normal dental care  To keep the teeth and gums healthy:  Brush the teeth with fluoride toothpaste twice a day--in the morning and at night--and floss at least once a day. Plaque can quickly build up on the teeth of older adults.  Watch for the signs of gum disease. These signs include gums that bleed after brushing or after eating hard foods, such as apples.  See a dentist regularly. Many experts recommend checkups every 6 months.  Keep the dentist up to date on any new medications the person is taking.  Encourage a balanced diet that includes whole grains, vegetables, and fruits, and that is low in saturated fat and sodium.  Encourage the person you're caring for not to use tobacco products. They can affect dental and general health.  Many older adults have a fixed income and feel that they can't afford dental care. But most towns and cities have programs in which dentists help older adults by lowering fees. Contact your area's public health offices or social services for information about dental care in your area.  Using a toothbrush  Older adults with arthritis sometimes have trouble brushing their teeth because they can't easily hold  the toothbrush. Their hands and fingers may be stiff, painful, or weak. If this is the case, you can:  Offer an IT trainer toothbrush.  Enlarge the handle of a non-electric toothbrush by wrapping a sponge, an elastic bandage, or adhesive tape around it.  Push the toothbrush handle through a ball made of rubber or soft foam.  Make the handle longer and thicker by taping Popsicle sticks or tongue depressors to it.  You may also be able to buy special toothbrushes, toothpaste dispensers, and floss holders.  Your doctor may recommend a soft-bristle toothbrush if the person you care for bleeds easily. Bleeding can happen because of a health problem or from certain medicines.  A toothpaste for sensitive teeth may help if the person you care for has sensitive teeth.  How do you brush and floss someone's teeth?  If the person you are caring for has a hard time cleaning their teeth on their own, you may need to brush and floss their teeth for them. It may be easiest to have the person sit and face away from you, and to sit or stand behind them. That way you can steady their head against your arm as you reach around to floss and brush their teeth. Choose a place that has good lighting and is comfortable for both of you.  Before you begin, gather your supplies. You will need gloves, floss, a toothbrush, and a container to hold water if you  are not near a sink. Wash and dry your hands well and put on gloves. Start by flossing:  Gently work a piece of floss between each of the teeth toward the gums. A plastic flossing tool may make this easier, and they are available at most drugstores.  Curve the floss around each tooth into a U-shape and gently slide it under the gum line.  Move the floss firmly up and down several times to scrape off the plaque.  After you've finished flossing, throw away the used floss and begin brushing:  Wet the brush and apply toothpaste.  Place the brush at a 45-degree angle where the teeth meet the gums.  Press firmly, and move the brush in small circles over the surface of the teeth.  Be careful not to brush too hard. Vigorous brushing can make the gums pull away from the teeth and can scratch the tooth enamel.  Brush all surfaces of the teeth, on the tongue side and on the cheek side. Pay special attention to the front teeth and all surfaces of the back teeth.  Brush chewing surfaces with short back-and-forth strokes.  After you've finished, help the person rinse the remaining toothpaste from their mouth.  Where can you learn more?  Go to https://www.bennett.info/ and enter F944 to learn more about "[place] for Older Adults."  Current as of: August 6, 2023Content Version: 13.9   2006-2023 Healthwise, Incorporated.   Care instructions adapted under license by [facility]. If you have questions about a medical condition or this instruction, always ask your healthcare professional. [place] any warranty or liability for your use of this information.           Learning About Vision Tests  What are vision tests?     The four most common vision tests are visual acuity tests, refraction, visual field tests, and color vision tests.  Visual acuity (sharpness) tests  These tests are used:  To see if you need glasses or contact lenses.  To monitor an eye problem.  To check an eye injury.  Visual acuity tests are done as part of routine exams. You may also have this test when you get your driver's license or apply for some types of jobs.  Visual field tests  These tests are used:  To check for vision loss in any area of your range of vision.  To screen for certain eye diseases.  To look for nerve damage after a stroke, head injury, or other problem that could reduce blood flow to the brain.  Refraction and color tests  A refraction test is done to find the right prescription for glasses and contact lenses.  A color vision test is done to check for color  blindness.  Color vision is often tested as part of a routine exam. You may also have this test when you apply for a job where recognizing different colors is important, such as truck driving, Research officer, trade union, or the [employer].  How are vision tests done?  Visual acuity test   You cover one eye at a time.  You read aloud from a wall chart across the room.  You read aloud from a small card that you hold in your hand.  Refraction   You look into a special device.  The device puts lenses of different strengths in front of each eye to see how strong your glasses or contact lenses need to be.  Visual field tests   Your doctor may  have you look through special machines.  Or your doctor may simply have you stare straight ahead while they move a finger into and out of your field of vision.  Color vision test   You look at pieces of printed test patterns in various colors. You say what number or symbol you see.  Your doctor may have you trace the number or symbol using a pointer.  How do these tests feel?  There is very little chance of having a problem from this test. If dilating drops are used for a vision test, they may make the eyes sting and cause a medicine taste in the mouth.  Follow-up care is a key part of your treatment and safety. Be sure to make and go to all appointments, and call your doctor if you are having problems. It's also a good idea to know your test results and keep a list of the medicines you take.  Where can you learn more?  Go to https://www.bennett.info/ and enter G551 to learn more about "Learning About Vision Tests."  Current as of: June 6, 2023Content Version: 13.9   2006-2023 Healthwise, Incorporated.   Care instructions adapted under license by [facility]. If you have questions about a medical condition or this instruction, always ask your healthcare professional. [place] any warranty or liability for your use of this information.           A  Healthy Heart: Care Instructions  Your Care Instructions     Coronary artery disease, also called heart disease, occurs when a substance called plaque builds up in the vessels that supply oxygen-rich blood to your heart muscle. This can narrow the blood vessels and reduce blood flow. A heart attack happens when blood flow is completely blocked. A high-fat diet, smoking, and other factors increase the risk of heart disease.  Your doctor has found that you have a chance of having heart disease. You can do lots of things to keep your heart healthy. It may not be easy, but you can change your diet, exercise more, and quit smoking. These steps really work to lower your chance of heart disease.  Follow-up care is a key part of your treatment and safety. Be sure to make and go to all appointments, and call your doctor if you are having problems. It's also a good idea to know your test results and keep a list of the medicines you take.  How can you care for yourself at home?  Diet   Use less salt when you cook and eat. This helps lower your blood pressure. Taste food before salting. Add only a little salt when you think you need it. With time, your taste buds will adjust to less salt.    Eat fewer snack items, fast foods, canned soups, and other high-salt, high-fat, processed foods.    Read food labels and try to avoid saturated and trans fats. They increase your risk of heart disease by raising cholesterol levels.    Limit the amount of solid fat-butter, margarine, and shortening-you eat. Use olive, peanut, or canola oil when you cook. Bake, broil, and steam foods instead of frying them.    Eat a variety of fruit and vegetables every day. Dark green, deep orange, red, or yellow fruits and vegetables are especially good for you. Examples include spinach, carrots, peaches, and berries.    Foods high in fiber can reduce your cholesterol and provide important vitamins and minerals. High-fiber foods include whole-grain  cereals  and breads, oatmeal, beans, brown rice, citrus fruits, and apples.    Eat lean proteins. Heart-healthy proteins include seafood, lean meats and poultry, eggs, beans, peas, nuts, seeds, and soy products.    Limit drinks and foods with added sugar. These include candy, desserts, and soda pop.   Lifestyle changes   If your doctor recommends it, get more exercise. Walking is a good choice. Bit by bit, increase the amount you walk every day. Try for at least 30 minutes on most days of the week. You also may want to swim, bike, or do other activities.    Do not smoke. If you need help quitting, talk to your doctor about stop-smoking programs and medicines. These can increase your chances of quitting for good. Quitting smoking may be the most important step you can take to protect your heart. It is never too late to quit.    Limit alcohol to 2 drinks a day for men and 1 drink a day for women. Too much alcohol can cause health problems.    Manage other health problems such as diabetes, high blood pressure, and high cholesterol. If you think you may have a problem with alcohol or drug use, talk to your doctor.   Medicines   Take your medicines exactly as prescribed. Call your doctor if you think you are having a problem with your medicine.    If your doctor recommends aspirin, take the amount directed each day. Make sure you take aspirin and not another kind of pain reliever, such as acetaminophen (Tylenol).   When should you call for help?   Call 911 if you have symptoms of a heart attack. These may include:   Chest pain or pressure, or a strange feeling in the chest.    Sweating.    Shortness of breath.    Pain, pressure, or a strange feeling in the back, neck, jaw, or upper belly or in one or both shoulders or arms.    Lightheadedness or sudden weakness.    A fast or irregular heartbeat.   After you call 911, the operator may tell you to chew 1 adult-strength or 2 to 4 low-dose aspirin. Wait for an  ambulance. Do not try to drive yourself.  Watch closely for changes in your health, and be sure to contact your doctor if you have any problems.  Where can you learn more?  Go to https://www.bennett.info/ and enter F075 to learn more about "A Healthy Heart: Care Instructions."  Current as of: June 25, 2023Content Version: 13.9   2006-2023 Healthwise, Incorporated.   Care instructions adapted under license by [facility]. If you have questions about a medical condition or this instruction, always ask your healthcare professional. [place] any warranty or liability for your use of this information.      Personalized Preventive Plan for KEVIN SPACE - 05/22/2022  Medicare offers a range of preventive health benefits. Some of the tests and screenings are paid in full while other may be subject to a deductible, co-insurance, and/or copay.    Some of these benefits include a comprehensive review of your medical history including lifestyle, illnesses that may run in your family, and various assessments and screenings as appropriate.    After reviewing your medical record and screening and assessments performed today your provider may have ordered immunizations, labs, imaging, and/or referrals for you.  A list of these orders (if applicable) as well as your Preventive Care list are included within your After  Visit Summary for your review.    Other Preventive Recommendations:    A preventive eye exam performed by an eye specialist is recommended every 1-2 years to screen for glaucoma; cataracts, macular degeneration, and other eye disorders.  A preventive dental visit is recommended every 6 months.  Try to get at least 150 minutes of exercise per week or 10,000 steps per day on a pedometer .  Order or download the FREE "Exercise & Physical Activity: Your Everyday Guide" from [employer]. Call (716)557-8238 or search [employer]  online.  You need 1200-1500 mg of calcium and 1000-2000 IU of vitamin D per day. It is possible to meet your calcium requirement with diet alone, but a vitamin D supplement is usually necessary to meet this goal.  When exposed to the sun, use a sunscreen that protects against both UVA and UVB radiation with an SPF of 30 or greater. Reapply every 2 to 3 hours or after sweating, drying off with a towel, or swimming.  Always wear a seat belt when traveling in a car. Always wear a helmet when riding a bicycle or motorcycle.

## 2022-11-05 ENCOUNTER — Ambulatory Visit: Admit: 2022-11-05 | Discharge: 2022-11-05 | Payer: MEDICARE | Attending: Family | Primary: Internal Medicine

## 2022-11-05 DIAGNOSIS — L84 Corns and callosities: Secondary | ICD-10-CM

## 2022-11-05 NOTE — Patient Instructions (Signed)
After shower or soaking foot, try pumice stone     If needed may try over the counter corn pads or remover.

## 2022-11-05 NOTE — Progress Notes (Signed)
Raymond Skinner (DOB:  [birth year]) is a 67 y.o. male,Established patient, here for evaluation of the following chief complaint(s):  Other (Noticed sore on side of right foot 6-8 months ago. )      ASSESSMENT/PLAN:    ICD-10-CM    1. Corn of foot  L84           Return if symptoms worsen or fail to improve.    SUBJECTIVE/OBJECTIVE:  HPI  Here for a sore on right foot, under big toe on the left side of the foot  First noticed it about a year ago  States the place is getting more sore  He reports that he has not put anything on it, he does not go barefoot  Hitting the lesion makes the pain worse   Has seen a podiatrist 20 years ago for issues with the arch of his foot, has not seen one since    BP 124/72   Pulse 50   Temp 97.4 F (36.3 C)   Wt 79.9 kg (176 lb 2 oz)   SpO2 95%   BMI 23.89 kg/m     Review of Systems   Constitutional:  Negative for chills and fever.   HENT:  Negative for congestion, ear pain, sinus pain and sore throat.    Eyes: Negative.    Respiratory:  Negative for cough, shortness of breath and wheezing.    Cardiovascular:  Negative for chest pain.   Gastrointestinal:  Negative for abdominal pain, diarrhea, nausea and vomiting.   Genitourinary: Negative.    Musculoskeletal: Negative.    Skin: Negative.         Skin lesion right foot   Neurological:  Negative for headaches.   Psychiatric/Behavioral: Negative.         Physical Exam  Constitutional:       Appearance: Normal appearance.   HENT:      Head: Normocephalic and atraumatic.      Nose: Nose normal.      Mouth/Throat:      Mouth: Mucous membranes are moist.      Pharynx: Oropharynx is clear.   Eyes:      Conjunctiva/sclera: Conjunctivae normal.   Cardiovascular:      Rate and Rhythm: Normal rate and regular rhythm.      Pulses: Normal pulses.      Heart sounds: Normal heart sounds.   Pulmonary:      Effort: Pulmonary effort is normal.      Breath sounds: Normal breath sounds.   Abdominal:      General: Bowel sounds are normal.       Palpations: Abdomen is soft.   Musculoskeletal:      Cervical back: Normal range of motion.   Feet:      Right foot:      Skin integrity: Callus (medial apsect of foot at MP joint.) present.   Skin:     General: Skin is warm and dry.      Capillary Refill: Capillary refill takes less than 2 seconds.   Neurological:      General: No focal deficit present.      Mental Status: He is alert and oriented to person, place, and time.   Psychiatric:         Mood and Affect: Mood normal.                 An electronic signature was used to authenticate this note.    --Darrin Luis, APRN

## 2023-03-27 ENCOUNTER — Encounter

## 2023-03-27 ENCOUNTER — Encounter: Admit: 2023-03-27 | Discharge: 2023-03-27 | Payer: MEDICARE | Attending: Family Medicine | Primary: Internal Medicine

## 2023-03-27 VITALS — BP 132/70 | HR 60 | Resp 18 | Ht 72.0 in | Wt 186.0 lb

## 2023-03-27 DIAGNOSIS — G3184 Mild cognitive impairment, so stated: Secondary | ICD-10-CM

## 2023-03-27 LAB — CBC WITH AUTO DIFFERENTIAL
Basophils %: 0.8 % (ref 0.0–1.0)
Basophils Absolute: 0 10*3/uL (ref 0.00–0.20)
Eosinophils %: 3.9 % (ref 0.0–5.0)
Eosinophils Absolute: 0.2 10*3/uL (ref 0.00–0.60)
Hematocrit: 41.5 % — ABNORMAL LOW (ref 42.0–52.0)
Hemoglobin: 13.3 g/dL — ABNORMAL LOW (ref 14.0–18.0)
Immature Granulocytes #: 0 10*3/uL
Lymphocytes %: 29.4 % (ref 20.0–40.0)
Lymphocytes Absolute: 1.5 10*3/uL (ref 1.1–4.5)
MCH: 30.2 pg (ref 27.0–31.0)
MCHC: 32 g/dL — ABNORMAL LOW (ref 33.0–37.0)
MCV: 94.3 fL — ABNORMAL HIGH (ref 80.0–94.0)
MPV: 10.3 fL (ref 9.4–12.4)
Monocytes %: 6.8 % (ref 0.0–10.0)
Monocytes Absolute: 0.4 10*3/uL (ref 0.00–0.90)
Neutrophils %: 58.9 % (ref 50.0–65.0)
Neutrophils Absolute: 3 10*3/uL (ref 1.5–7.5)
Platelets: 190 10*3/uL (ref 130–400)
RBC: 4.4 M/uL — ABNORMAL LOW (ref 4.70–6.10)
RDW: 13 % (ref 11.5–14.5)
WBC: 5.1 10*3/uL (ref 4.8–10.8)

## 2023-03-27 LAB — LIPID PANEL
Cholesterol, Total: 185 mg/dL (ref 0–199)
HDL: 50 mg/dL (ref 40–60)
LDL Cholesterol: 105 mg/dL — ABNORMAL HIGH (ref <100)
Triglycerides: 151 mg/dL — ABNORMAL HIGH (ref 0–149)

## 2023-03-27 LAB — COMPREHENSIVE METABOLIC PANEL
ALT: 15 U/L (ref 5–41)
AST: 17 U/L (ref 5–40)
Albumin: 4.3 g/dL (ref 3.5–5.2)
Alkaline Phosphatase: 63 U/L (ref 40–129)
Anion Gap: 10 mmol/L (ref 7–19)
BUN: 12 mg/dL (ref 8–23)
CO2: 29 mmol/L (ref 22–29)
Calcium: 9.1 mg/dL (ref 8.8–10.2)
Chloride: 103 mmol/L (ref 98–111)
Creatinine: 0.9 mg/dL (ref 0.7–1.2)
Est, Glom Filt Rate: >90 (ref >60)
Glucose: 118 mg/dL — ABNORMAL HIGH (ref 70–99)
Potassium: 4.6 mmol/L (ref 3.5–5.0)
Sodium: 142 mmol/L (ref 136–145)
Total Bilirubin: 0.3 mg/dL (ref 0.2–1.2)
Total Protein: 7 g/dL (ref 6.4–8.3)

## 2023-03-27 LAB — TSH WITH REFLEX TO FT4: TSH Reflex FT4: 1.07 u[IU]/mL (ref 0.27–4.20)

## 2023-03-27 MED ORDER — DONEPEZIL HCL 5 MG PO TABS
5 | ORAL_TABLET | Freq: Every evening | ORAL | 1 refills | Status: AC
Start: 2023-03-27 — End: ?

## 2023-03-27 NOTE — Progress Notes (Signed)
Raymond Skinner (DOB:  1956/04/20) is a 67 y.o. male,New patient, here for evaluation of the following chief complaint(s):  New Patient (Pt adv here to est care and no complaints)      Assessment & Plan   ASSESSMENT/PLAN:  1. Mild cognitive impairment  -     donepezil (ARICEPT) 5 MG tablet; Take 1 tablet by mouth nightly, Disp-90 tablet, R-1Normal  2. Encounter to establish care  -     Comprehensive Metabolic Panel; Future  -     CBC with Auto Differential; Future  -     TSH with Reflex to FT4; Future  -     Lipid Panel; Future  3. Tobacco use  -     Vascular AAA screening; Future  4. Colon cancer screening  -     Fecal DNA Colorectal cancer screening (Cologuard)  5. Need for influenza vaccination  -     Influenza, FLUAD Trivalent, (age 36 y+), IM, Preservative Free, 0.54mL      Patient does appear to be having worsening memory deficits.  This is first time I have seen the patient and I do not have a firm grasp on what his baseline is however he does seem to have issues with basic memory testing.  Discussed with patient there is no way to reverse memory loss however we may be able to slow his progression with certain medicines and with lifestyle alterations.  Patient was agreeable to starting donepezil for slowing memory loss.  Also encouraged him to start doing different brain games in order to build synaptic density.  Will obtain labs to rule out other underlying medical pathology.  Given history of smoking he would need a AAA screening which he was agreeable to scheduling  Patient also agreeable to scheduling Cologuard      Return in about 6 months (around 09/24/2023) for MAW.         Subjective   SUBJECTIVE/OBJECTIVE:  Raymond Skinner is a 67 y.o. male who presents to establish care.  Overall patient says he feels well however his roommate says that she is concerned about his worsening forgetfulness.  Patient says this can be bothersome at times as he will often forget what he is doing.  He denies any history of  stroke however says that he used to smoke for several years at around 1/2 pack/day for 12 years.  Patient says that his memory loss seems to be progressive              Review of Systems   Constitutional:  Negative for activity change and fever.   HENT:  Negative for congestion.    Eyes:  Negative for visual disturbance.   Respiratory:  Negative for chest tightness, shortness of breath and wheezing.    Cardiovascular:  Negative for chest pain.   Gastrointestinal:  Negative for abdominal pain and blood in stool.   Genitourinary:  Negative for difficulty urinating and urgency.   Neurological:  Negative for weakness and headaches.   Psychiatric/Behavioral:  Negative for confusion.           Objective   Physical Exam  Vitals reviewed.   Constitutional:       General: He is not in acute distress.     Appearance: Normal appearance. He is not ill-appearing.   HENT:      Head: Normocephalic and atraumatic.   Cardiovascular:      Rate and Rhythm: Normal rate and regular rhythm.  Pulses: Normal pulses.      Heart sounds: Normal heart sounds. No murmur heard.  Pulmonary:      Effort: Pulmonary effort is normal. No respiratory distress.      Breath sounds: Normal breath sounds. No wheezing or rhonchi.   Chest:      Chest wall: No tenderness.   Abdominal:      General: Abdomen is flat. Bowel sounds are normal. There is no distension.      Palpations: Abdomen is soft.      Tenderness: There is no abdominal tenderness.   Musculoskeletal:         General: No swelling.   Skin:     General: Skin is warm and dry.   Neurological:      General: No focal deficit present.      Mental Status: He is alert.   Psychiatric:         Cognition and Memory: Memory is impaired.      Comments: Patient unable to spell world backward though was able to get 4 out of 5 letters correct, patient failed to recall all 3 words, patient is oriented to person place and time              Vitals:    03/27/23 0934   BP: 132/70   Pulse: 60   Resp: 18   SpO2:  98%        Current Outpatient Medications   Medication Sig Dispense Refill    donepezil (ARICEPT) 5 MG tablet Take 1 tablet by mouth nightly 90 tablet 1    Multiple Vitamins-Minerals (THERAPEUTIC MULTIVITAMIN-MINERALS) tablet Take 1 tablet by mouth daily      aspirin 81 MG chewable tablet Take 1 tablet by mouth daily      vitamin B-6 (PYRIDOXINE) 100 MG tablet Take 1 tablet by mouth daily      Vitamin D-Vitamin K (VITAMIN K2-VITAMIN D3 PO) Take 90 mcg by mouth daily      magnesium 30 MG tablet Take 250 mg by mouth daily      Potassium 99 MG TABS Take by mouth      zinc gluconate 50 MG tablet Take 1 tablet by mouth daily      Ginkgo 60 MG TABS Take by mouth      vitamin E 1000 units capsule Take 1 capsule by mouth daily (Patient not taking: Reported on 03/27/2023)      cyanocobalamin 1000 MCG tablet Take 2.5 tablets by mouth daily (Patient not taking: Reported on 03/27/2023)       No current facility-administered medications for this visit.      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   No Known Allergies     Lab Results   Component Value Date    NA 142 03/27/2023    K 4.6 03/27/2023    CL 103 03/27/2023    CO2 29 03/27/2023    BUN 12 03/27/2023  CREATININE 0.9 03/27/2023    GLUCOSE 118 (H) 03/27/2023    CALCIUM 9.1 03/27/2023    BILITOT 0.3 03/27/2023    ALKPHOS 63 03/27/2023    AST 17 03/27/2023    ALT 15 03/27/2023    LABGLOM >90 03/27/2023        Lab Results   Component Value Date    WBC 5.1 03/27/2023    HGB 13.3 (L) 03/27/2023    HCT 41.5 (L) 03/27/2023    MCV 94.3 (H) 03/27/2023    PLT 190 03/27/2023                EMR Dragon/transcription  disclaimer:  Much of this encounter note is electronic transcription/translation of spoken language toprinted texts.  The electronic translation of spoken language may be erroneous, or at times, nonsensical words or phrases may be inadvertently transcribed.  Although I have reviewed the note for such errors, some may stillexist.      An electronic signature was used to authenticate this note.    --Eliezer Bottom, MD

## 2023-04-01 NOTE — Other (Signed)
 Overall labs are unrevealing.  Electrolytes, kidney function, liver function are normal.  Thyroid function normal.  Bad cholesterol is slightly elevated and I would recommend starting cholesterol medication to help better control this as his 10-year risk i

## 2023-04-03 MED ORDER — SIMVASTATIN 20 MG PO TABS
20 MG | ORAL_TABLET | Freq: Every evening | ORAL | 0 refills | Status: AC
Start: 2023-04-03 — End: 2023-05-18

## 2023-04-03 MED ORDER — SIMVASTATIN 20 MG PO TABS
20 | ORAL_TABLET | Freq: Every evening | ORAL | 0 refills | Status: DC
Start: 2023-04-03 — End: 2023-04-03

## 2023-04-03 NOTE — Progress Notes (Signed)
 The 10-year ASCVD risk score (Arnett DK, et al., 2019) is: 13.7%    Values used to calculate the score:      Age: 67 years      Sex: Male      Is Non-Hispanic African American: No      Diabetic: No      Tobacco smoker: No      Systolic Blood Pressure: 132

## 2023-04-03 NOTE — Other (Signed)
 Prescription sent to pharmacy.

## 2023-04-08 LAB — FECAL DNA COLORECTAL CANCER SCREENING (COLOGUARD): FIT-DNA (Cologuard): NEGATIVE

## 2023-04-24 ENCOUNTER — Ambulatory Visit: Payer: MEDICARE | Primary: Family Medicine

## 2023-05-18 MED ORDER — SIMVASTATIN 20 MG PO TABS
20 | ORAL_TABLET | Freq: Every evening | ORAL | 0 refills | Status: AC
Start: 2023-05-18 — End: ?

## 2023-05-27 ENCOUNTER — Ambulatory Visit: Payer: MEDICARE | Attending: Internal Medicine | Primary: Family Medicine

## 2023-06-23 ENCOUNTER — Ambulatory Visit: Admit: 2023-06-23 | Discharge: 2023-06-23 | Payer: MEDICARE | Attending: Family | Primary: Family Medicine

## 2023-06-23 VITALS — BP 146/78 | HR 53 | Temp 97.20000°F | Ht 72.0 in | Wt 184.2 lb

## 2023-06-23 DIAGNOSIS — F1010 Alcohol abuse, uncomplicated: Secondary | ICD-10-CM

## 2023-06-23 MED ORDER — VIVITROL 380 MG IM SUSR
380 | INTRAMUSCULAR | 11 refills | Status: DC
Start: 2023-06-23 — End: 2023-12-08

## 2023-06-23 NOTE — Progress Notes (Addendum)
Raymond Skinner (DOB:  March 25, 1956) is a 68 y.o. male,Established patient, here for evaluation of the following chief complaint(s):  Rehab follow up (Patient just got out of rehab and would like to start Vivatrol shots./)         Assessment & Plan  Alcohol abuse    Will start treatment once approved   Continue to avoid alcohol   Suggest counsceling and AA meetings  With close follow up  Orders:    naltrexone (VIVITROL) 380 MG injection; Inject 380 mg into the muscle every 28 days      No follow-ups on file.       Subjective   HPI    Patient is here for follow up from rehab   Was at treatment a week detox [facility]  Then to [facility] for follow up    They recommended vivatrol and antibuse   Alcohol abuse quit around a week or two   Reports he feels ok   Able function    He reports had drank for a long time   If had made it a few weeks to a month then would back slide    Review of Systems   Constitutional:  Positive for activity change. Negative for appetite change, fatigue and fever.   HENT:  Negative for congestion and postnasal drip.    Respiratory:  Negative for cough, shortness of breath and wheezing.    Cardiovascular:  Negative for chest pain, palpitations and leg swelling.   Gastrointestinal:  Negative for abdominal pain.   Genitourinary:  Negative for difficulty urinating.   Musculoskeletal:  Negative for arthralgias and back pain.   Skin:  Negative for rash.   Neurological:  Negative for dizziness and headaches.   Psychiatric/Behavioral:  Negative for behavioral problems, self-injury and sleep disturbance. The patient is not nervous/anxious.           Objective   Physical Exam  Vitals reviewed.   Constitutional:       General: He is not in acute distress.     Appearance: Normal appearance. He is not ill-appearing.   HENT:      Head: Normocephalic.      Right Ear: Tympanic membrane normal. There is no impacted cerumen.      Left Ear: Tympanic membrane normal. There is no impacted cerumen.      Nose: No  rhinorrhea.      Mouth/Throat:      Pharynx: No posterior oropharyngeal erythema.   Eyes:      General:         Right eye: No discharge.         Left eye: No discharge.   Cardiovascular:      Rate and Rhythm: Normal rate and regular rhythm.      Pulses: Normal pulses.      Heart sounds: No murmur heard.  Pulmonary:      Effort: Pulmonary effort is normal.      Breath sounds: Normal breath sounds. No wheezing or rhonchi.   Skin:     Findings: No rash.   Neurological:      General: No focal deficit present.      Mental Status: He is alert and oriented to person, place, and time.   Psychiatric:         Mood and Affect: Mood normal.         Behavior: Behavior normal.  An electronic signature was used to authenticate this note.    --Jessica Priest, APRN

## 2023-06-25 ENCOUNTER — Ambulatory Visit: Admit: 2023-06-25 | Discharge: 2023-06-25 | Payer: MEDICARE | Attending: Family | Primary: Family Medicine

## 2023-06-25 VITALS — BP 108/70 | HR 66 | Temp 99.00000°F | Ht 72.01 in | Wt 185.2 lb

## 2023-06-25 DIAGNOSIS — F1010 Alcohol abuse, uncomplicated: Secondary | ICD-10-CM

## 2023-06-25 MED ORDER — NALTREXONE 380 MG IM SUSR
380 | INTRAMUSCULAR | Status: DC
Start: 2023-06-25 — End: 2023-12-08
  Administered 2023-06-25: 14:00:00 380 mg via INTRAMUSCULAR

## 2023-06-25 NOTE — Progress Notes (Signed)
Raymond Skinner (DOB:  1955-06-15) is a 68 y.o. male,Established patient, here for evaluation of the following chief complaint(s):  Injections (Patient is here for vivitrol. )         Assessment & Plan  Alcohol abuse   Chronic, at goal (stable), first shot today , medication adherence emphasized, and lifestyle modifications recommended    Orders:    naltrexone (VIVITROL) injection 380 mg      Return in about 4 weeks (around 07/23/2023) for 1 month injection follow up.       Subjective   HPI    Patient is here for alcohol abuse follow up  He done well the last few days  Is ready to start vivitrol  Denies craves and doing well  He is feeling well  No problems     Review of Systems   Constitutional:  Positive for activity change. Negative for appetite change, fatigue and fever.   HENT:  Negative for congestion and postnasal drip.    Respiratory:  Negative for cough, shortness of breath and wheezing.    Cardiovascular:  Negative for chest pain, palpitations and leg swelling.   Gastrointestinal:  Negative for abdominal pain.   Genitourinary:  Negative for difficulty urinating.   Musculoskeletal:  Negative for arthralgias and back pain.   Skin:  Negative for rash.   Neurological:  Negative for dizziness and headaches.   Psychiatric/Behavioral:  Negative for behavioral problems, self-injury and sleep disturbance. The patient is not nervous/anxious.           Objective   Physical Exam  Vitals reviewed.   Constitutional:       General: He is not in acute distress.     Appearance: Normal appearance. He is not ill-appearing.   HENT:      Head: Normocephalic.      Right Ear: Tympanic membrane normal. There is no impacted cerumen.      Left Ear: Tympanic membrane normal. There is no impacted cerumen.      Nose: No rhinorrhea.      Mouth/Throat:      Pharynx: No posterior oropharyngeal erythema.   Eyes:      General:         Right eye: No discharge.         Left eye: No discharge.   Cardiovascular:      Rate and Rhythm: Normal  rate and regular rhythm.      Pulses: Normal pulses.      Heart sounds: No murmur heard.  Pulmonary:      Effort: Pulmonary effort is normal.      Breath sounds: Normal breath sounds. No wheezing or rhonchi.   Skin:     Findings: No rash.   Neurological:      General: No focal deficit present.      Mental Status: He is alert and oriented to person, place, and time.   Psychiatric:         Mood and Affect: Mood normal.         Behavior: Behavior normal.                  An electronic signature was used to authenticate this note.    --Jessica Priest, APRN

## 2023-07-16 ENCOUNTER — Encounter

## 2023-07-16 MED ORDER — DISULFIRAM 500 MG PO TABS
500 | ORAL_TABLET | Freq: Every day | ORAL | 5 refills | Status: AC
Start: 2023-07-16 — End: ?

## 2023-07-16 NOTE — Telephone Encounter (Signed)
 Okay. What dose and instructions? I see a 250mg  and 500mg  in our system.

## 2023-07-16 NOTE — Telephone Encounter (Signed)
 Patients wife called stating that the Vivatrol was going to cost $800. She wants to know if we can change to Antabuse since it is cheaper?    Will send to provider. Please advise.

## 2023-07-16 NOTE — Telephone Encounter (Signed)
 Tried to call SIL back and phone number is not a phone that is in service. Checked recent paperwork filled out and that is the number patient listed as his contact. Called patient and left a vmsg we are sending in alternate medication.     Will send to provider to send in.    Requested Prescriptions     Pending Prescriptions Disp Refills    Disulfiram 500 MG TABS 30 tablet 5     Sig: Take 1 tablet by mouth daily

## 2023-07-20 MED ORDER — SIMVASTATIN 20 MG PO TABS
20 | ORAL_TABLET | Freq: Every evening | ORAL | 2 refills | Status: AC
Start: 2023-07-20 — End: ?

## 2023-07-20 NOTE — Telephone Encounter (Signed)
 It looks as though this patient has been going back to [facility] and seeing another provider.  Please verify what his intentions are as he has seen them since he last saw me.  If he has reestablished again I would recommend refills go through them

## 2023-07-28 ENCOUNTER — Encounter: Payer: MEDICARE | Attending: Family | Primary: Internal Medicine

## 2023-09-25 ENCOUNTER — Encounter: Payer: MEDICARE | Attending: Family Medicine | Primary: Internal Medicine

## 2023-12-03 ENCOUNTER — Encounter

## 2023-12-03 MED ORDER — DONEPEZIL HCL 5 MG PO TABS
5 | ORAL_TABLET | Freq: Every evening | ORAL | 1 refills | Status: DC
Start: 2023-12-03 — End: 2023-12-08

## 2023-12-08 ENCOUNTER — Encounter

## 2023-12-08 ENCOUNTER — Ambulatory Visit: Admit: 2023-12-08 | Discharge: 2023-12-08 | Payer: Medicare (Managed Care) | Attending: Family | Primary: Family

## 2023-12-08 VITALS — BP 132/70 | HR 58 | Temp 98.30000°F | Ht 72.01 in | Wt 188.0 lb

## 2023-12-08 DIAGNOSIS — F1010 Alcohol abuse, uncomplicated: Principal | ICD-10-CM

## 2023-12-08 LAB — CBC WITH AUTO DIFFERENTIAL
Basophils %: 0.7 % (ref 0.0–1.0)
Basophils Absolute: 0 K/uL (ref 0.00–0.20)
Eosinophils %: 1.5 % (ref 0.0–5.0)
Eosinophils Absolute: 0.1 K/uL (ref 0.00–0.60)
Hematocrit: 39.2 % — ABNORMAL LOW (ref 42.0–52.0)
Hemoglobin: 13.1 g/dL — ABNORMAL LOW (ref 14.0–18.0)
Immature Granulocytes #: 0 K/uL
Lymphocytes %: 30.6 % (ref 20.0–40.0)
Lymphocytes Absolute: 1.4 K/uL (ref 1.1–4.5)
MCH: 30.5 pg (ref 27.0–31.0)
MCHC: 33.4 g/dL (ref 33.0–37.0)
MCV: 91.4 fL (ref 80.0–94.0)
MPV: 10.5 fL (ref 9.4–12.4)
Monocytes %: 7.5 % (ref 0.0–10.0)
Monocytes Absolute: 0.3 K/uL (ref 0.00–0.90)
Neutrophils %: 59.5 % (ref 50.0–65.0)
Neutrophils Absolute: 2.7 K/uL (ref 1.5–7.5)
Platelets: 197 K/uL (ref 130–400)
RBC: 4.29 M/uL — ABNORMAL LOW (ref 4.70–6.10)
RDW: 13 % (ref 11.5–14.5)
WBC: 4.5 K/uL — ABNORMAL LOW (ref 4.8–10.8)

## 2023-12-08 LAB — COMPREHENSIVE METABOLIC PANEL
ALT: 121 U/L — ABNORMAL HIGH (ref 10–50)
AST: 53 U/L — ABNORMAL HIGH (ref 10–50)
Albumin: 4.2 g/dL (ref 3.5–5.2)
Alkaline Phosphatase: 76 U/L (ref 40–129)
Anion Gap: 11 mmol/L (ref 8–16)
BUN: 9 mg/dL (ref 8–23)
CO2: 26 mmol/L (ref 22–29)
Calcium: 9.1 mg/dL (ref 8.8–10.2)
Chloride: 105 mmol/L (ref 98–107)
Creatinine: 0.9 mg/dL (ref 0.7–1.2)
Est, Glom Filt Rate: >90 (ref >60)
Glucose: 143 mg/dL — ABNORMAL HIGH (ref 70–99)
Potassium: 4.7 mmol/L (ref 3.5–5.1)
Sodium: 142 mmol/L (ref 136–145)
Total Bilirubin: 0.4 mg/dL (ref 0.2–1.2)
Total Protein: 6.7 g/dL (ref 6.4–8.3)

## 2023-12-08 LAB — MAGNESIUM: Magnesium: 2 mg/dL (ref 1.6–2.4)

## 2023-12-08 LAB — AMMONIA: Ammonia: 19 umol/L (ref 16–60)

## 2023-12-08 LAB — VITAMIN B12 & FOLATE
Folate: 7.4 ng/mL (ref 4.5–32.2)
Vitamin B-12: 1337 pg/mL — ABNORMAL HIGH (ref 232–1245)

## 2023-12-08 LAB — TSH: TSH: 1.17 u[IU]/mL (ref 0.270–4.200)

## 2023-12-08 LAB — T4, FREE: T4 Free: 0.92 ng/dL — ABNORMAL LOW (ref 0.93–1.70)

## 2023-12-08 MED ORDER — DONEPEZIL HCL 10 MG PO TABS
10 | ORAL_TABLET | Freq: Every evening | ORAL | 11 refills | Status: AC
Start: 2023-12-08 — End: ?

## 2023-12-08 MED ORDER — MEMANTINE HCL 5 MG PO TABS
5 | ORAL_TABLET | Freq: Two times a day (BID) | ORAL | 5 refills | Status: AC
Start: 2023-12-08 — End: ?

## 2023-12-08 NOTE — Telephone Encounter (Signed)
 Pt lives with him. Patient sister in law states that she is unable to talk about pt memory loss while in the room and would like it documented for the provider to see and be aware of.     [place] things to the church a [place] down the road and asking them how to get home.     Unable to tie shoes after trying multiple times.    Unable to remember asking when an appointment is, and will ask multiple times and not remember asking.    She has to tell him to shower and shave.    Having issues putting things on a plate and feeding himself, he is very messy. He is eating things that he does not normally like.    Very concerned about the rapid decline in memory.

## 2023-12-08 NOTE — Progress Notes (Signed)
 Raymond Skinner (DOB:  1955-06-19) is a 68 y.o. male, Established patient, here for evaluation of the following chief complaint(s):  Memory Loss (Patient is here today for issues with memory. He states that it is declining quickly. Patient states that he has been noticing issues with his memory for the last couple of years. Patient sister in law states that he does lose time in some instances. Patient is taking a medication for his memory but the wife states that might need to be changed to something else. ) and Health Maintenance (Patient is due for Lung CT, AAA screen)         Assessment & Plan  1. Memory decline: Chronic.  - He has been on donepezil  5 mg since last year.  - Increase donepezil  dosage from 5 mg to 10 mg at bedtime.  - Introduce Namenda  at a dosage of 5 mg twice daily.  - Referral to neurology for further evaluation.  - Order CT scan to rule out structural abnormalities.  - Conduct blood work to check for metabolic causes.  - Advise against driving until memory issues are better managed.    2. Alcohol use disorder: Stable.  - History of alcohol use disorder with multiple rehabilitations.  - Reports no current alcohol consumption.  - Review and continue current medication regimen to prevent relapse.    Follow-up  - Referral to neurology.  - CT scan.  - Blood work.    Results    No results found for this visit on 12/08/23.    ICD-10-CM    1. Alcohol abuse  F10.10 CBC with Auto Differential     Comprehensive Metabolic Panel     TSH     T4, Free     Magnesium     Vitamin B12 & Folate     Ammonia     CT HEAD WO CONTRAST     [place] - Honora Norris, APRN, Neurology - Memory Evaluation, [place]      2. Decline in verbal memory  R41.3 CBC with Auto Differential     Comprehensive Metabolic Panel     TSH     T4, Free     Magnesium     Vitamin B12 & Folate     [place] - Honora Norris, APRN, Neurology - Memory Evaluation, [place]     memantine  (NAMENDA ) 5 MG tablet      3. Memory loss  R41.3 CT HEAD WO  CONTRAST     [place] - Honora Norris, APRN, Neurology - Memory Evaluation, [place]     memantine  (NAMENDA ) 5 MG tablet      4. 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       Subjective   History of Present Illness  The patient is a 68 year old male who presents for memory decline. He is accompanied by his ex-sister-in-law.    Memory Decline  - He has been under the care of Dr. Rozell, who prescribed Aricept  5 mg at bedtime last year.  - He continues to drive.  - He has a family history of Alzheimer's disease on his father's side, with several relatives having been in nursing homes due to the condition.  - His sister is currently in a nursing home.    Alcohol Rehabilitation  - He has undergone alcohol  rehabilitation on several occasions.  - He was previously administered an injection, which was later switched to oral medication.  - He reports abstaining from alcohol.    Social History  - Alcohol: He reports abstaining from alcohol.  - Sleep: He reports going to bed at 10:00 PM and waking up at 6:00 AM.  - Living Condition: He resides with his ex-sister-in-law.    He reports no issues with his sleep pattern, typically going to bed at 10:00 PM and waking up at 6:00 AM. His bowel movements are regular, and he does not experience any weakness in his arms or legs.    FAMILY HISTORY  - Paternal relatives have a history of Alzheimer's disease  - Father has Alzheimer's disease  - Grandmother has Alzheimer's disease  - Mother has Alzheimer's disease  - Sister, age 7, is currently in a nursing home for Alzheimer's    Prior to Visit Medications    Medication Sig Taking? Authorizing Provider   donepezil  (ARICEPT ) 10 MG tablet Take 1 tablet by mouth nightly Yes Joshua Dorothyann SAILOR, APRN   memantine  (NAMENDA ) 5 MG tablet Take 1 tablet by mouth 2 times daily Yes Joshua Dorothyann SAILOR, APRN   simvastatin  (ZOCOR ) 20 MG tablet TAKE ONE TABLET BY MOUTH NIGHTLY Yes [employer], B Bradley, DO   Disulfiram  500 MG TABS Take 1 tablet by mouth daily Yes Joshua Dorothyann SAILOR, APRN   Multiple Vitamins-Minerals (THERAPEUTIC MULTIVITAMIN-MINERALS) tablet Take 1 tablet by mouth daily Yes [provider]   aspirin 81 MG chewable tablet Take 1 tablet by mouth daily Yes [provider]   vitamin B-6 (PYRIDOXINE) 100 MG tablet Take 1 tablet by mouth daily Yes [provider]   Vitamin D-Vitamin K (VITAMIN K2-VITAMIN D3 PO) Take 90 mcg by mouth daily Yes [provider]   vitamin E 1000 units capsule Take 1 capsule by mouth daily Yes [provider]   cyanocobalamin 1000 MCG tablet Take 2.5 tablets by mouth daily Yes [provider]   magnesium 30 MG tablet Take 250 mg by mouth daily Yes [provider]   Potassium 99 MG TABS Take by mouth Yes [provider]   zinc gluconate 50 MG tablet Take 1 tablet by mouth daily Yes [provider]   Ginkgo 60 MG TABS Take by mouth Yes [provider]        No Known Allergies    Past Medical History:   Diagnosis Date    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 No pertinent surgical history.    Family History   Problem Relation Age of Onset    Diabetes Mother     Dementia Father     No Known Problems Maternal Uncle     Dementia Paternal Aunt     Dementia Maternal Grandmother     Dementia Maternal Grandfather     Dementia Paternal Grandmother     Dementia Paternal Grandfather          Review of Systems   Constitutional:  Positive for activity change. Negative for appetite change, fatigue and fever.   HENT:  Negative for congestion and postnasal drip.    Respiratory:  Negative for cough, shortness of breath and wheezing.    Cardiovascular:  Negative for chest pain, palpitations and leg swelling.   Gastrointestinal:  Negative for abdominal pain.   Genitourinary:   Negative for difficulty urinating.   Musculoskeletal:  Negative for arthralgias and back pain.   Skin:  Negative for rash.   Neurological:  Negative for dizziness and headaches.   Psychiatric/Behavioral:  Positive for agitation and confusion (knows self only). Negative for behavioral problems, self-injury and sleep disturbance. The patient is not nervous/anxious.           Objective   Blood pressure 132/70, pulse 58, temperature 98.3 F (36.8 C), temperature source Temporal, height 1.829 m (6' 0.01), weight 85.3 kg (188 lb), SpO2 94%.  Physical Exam       Physical Exam  Vitals reviewed.   Constitutional:       General: He is not in acute distress.     Appearance: Normal appearance. He is not ill-appearing.   HENT:      Head: Normocephalic.      Right Ear: Tympanic membrane normal. There is no impacted cerumen.      Left Ear: Tympanic membrane normal. There is no impacted cerumen.      Nose: No rhinorrhea.      Mouth/Throat:      Pharynx: No posterior oropharyngeal erythema.   Eyes:      General:         Right eye: No discharge.         Left eye: No discharge.   Cardiovascular:      Rate and Rhythm: Normal rate and regular rhythm.      Pulses: Normal pulses.      Heart sounds: No murmur heard.  Pulmonary:      Effort: Pulmonary effort is normal.      Breath sounds: Normal breath sounds. No wheezing or rhonchi.   Skin:     Findings: No rash.   Neurological:      General: No focal deficit present.      Mental Status: He is alert and oriented to person, place, and time.   Psychiatric:         Mood and Affect: Mood normal.         Behavior: Behavior normal.      Comments: Doesn't know anyone but self and his care taker              The patient (or guardian, if applicable) and other individuals in attendance with the patient were advised that Artificial Intelligence will be utilized during this visit to record, process the conversation to generate a clinical note and to support improvement of the AI technology. The  patient (or guardian, if applicable) and other individuals in attendance at the appointment consented to the use of AI, including the recording.      An electronic signature was used to authenticate this note.    --Dorothyann LOISE Molt, APRN

## 2023-12-09 LAB — HEMOGLOBIN A1C: Hemoglobin A1C: 6 % — ABNORMAL HIGH (ref 4.0–5.6)

## 2023-12-16 ENCOUNTER — Ambulatory Visit: Payer: Medicare (Managed Care) | Primary: Family

## 2023-12-17 ENCOUNTER — Emergency Department: Admit: 2023-12-17 | Payer: Medicare (Managed Care) | Primary: Family

## 2023-12-17 ENCOUNTER — Inpatient Hospital Stay
Admit: 2023-12-17 | Discharge: 2023-12-18 | Disposition: A | Discharge status: Home / Self Care | Payer: Medicare (Managed Care) | Arrived: WI | Attending: Emergency Medicine

## 2023-12-17 DIAGNOSIS — R410 Disorientation, unspecified: Principal | ICD-10-CM

## 2023-12-17 LAB — URINALYSIS
Bilirubin, Urine: NEGATIVE
Blood, Urine: NEGATIVE
Glucose, Ur: NEGATIVE mg/dL
Leukocyte Esterase, Urine: NEGATIVE
Nitrite, Urine: NEGATIVE
Protein, UA: NEGATIVE mg/dL
Specific Gravity, UA: 1.021 (ref 1.005–1.030)
Urobilinogen, Urine: 1 U/dL (ref <2.0)
pH, Urine: 6.5 (ref 5.0–8.0)

## 2023-12-17 LAB — TSH: TSH: 1.18 u[IU]/mL (ref 0.270–4.200)

## 2023-12-17 LAB — ETHANOL: Ethanol Lvl: <11 mg/dL (ref 0–11)

## 2023-12-17 LAB — COMPREHENSIVE METABOLIC PANEL
ALT: 89 U/L — ABNORMAL HIGH (ref 10–50)
AST: 45 U/L (ref 10–50)
Albumin: 4.2 g/dL (ref 3.5–5.2)
Alkaline Phosphatase: 73 U/L (ref 40–129)
Anion Gap: 8 mmol/L (ref 8–16)
BUN: 13 mg/dL (ref 8–23)
CO2: 27 mmol/L (ref 22–29)
Calcium: 9.4 mg/dL (ref 8.8–10.2)
Chloride: 107 mmol/L (ref 98–107)
Creatinine: 1 mg/dL (ref 0.7–1.2)
Est, Glom Filt Rate: 83 (ref >60)
Glucose: 123 mg/dL — ABNORMAL HIGH (ref 70–99)
Potassium: 4.4 mmol/L (ref 3.5–5.1)
Sodium: 142 mmol/L (ref 136–145)
Total Bilirubin: 0.4 mg/dL (ref 0.2–1.2)
Total Protein: 6.6 g/dL (ref 6.4–8.3)

## 2023-12-17 LAB — CBC WITH AUTO DIFFERENTIAL
Basophils %: 0.8 % (ref 0.0–1.0)
Basophils Absolute: 0 K/uL (ref 0.00–0.20)
Eosinophils %: 0.8 % (ref 0.0–5.0)
Eosinophils Absolute: 0 K/uL (ref 0.00–0.60)
Hematocrit: 38.8 % — ABNORMAL LOW (ref 42.0–52.0)
Hemoglobin: 13.4 g/dL — ABNORMAL LOW (ref 14.0–18.0)
Immature Granulocytes #: 0 K/uL
Lymphocytes %: 24.6 % (ref 20.0–40.0)
Lymphocytes Absolute: 1.3 K/uL (ref 1.1–4.5)
MCH: 30.9 pg (ref 27.0–31.0)
MCHC: 34.5 g/dL (ref 33.0–37.0)
MCV: 89.6 fL (ref 80.0–94.0)
MPV: 9.7 fL (ref 9.4–12.4)
Monocytes %: 6.7 % (ref 0.0–10.0)
Monocytes Absolute: 0.4 K/uL (ref 0.00–0.90)
Neutrophils %: 66.9 % — ABNORMAL HIGH (ref 50.0–65.0)
Neutrophils Absolute: 3.5 K/uL (ref 1.5–7.5)
Platelets: 176 K/uL (ref 130–400)
RBC: 4.33 M/uL — ABNORMAL LOW (ref 4.70–6.10)
RDW: 13 % (ref 11.5–14.5)
WBC: 5.2 K/uL (ref 4.8–10.8)
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
## 2023-12-17 NOTE — ED Notes (Signed)
 Patient states that he went to the grocery store alone around 1 pm . He said that he feltstrange .His wife states that he made it home safely but the patient was concerned that what happened was not normal. He kept repeating to her I have had some kind of a episode . His speech is clear and he answers questions without hesitation

## 2023-12-18 LAB — DRUG SCRN, BUPRENORPHINE
Amphetamine Screen, Ur: NEGATIVE (ref <500)
Barbiturate Screen, Ur: NEGATIVE (ref <200)
Benzodiazepine Screen, Urine: NEGATIVE (ref <150)
Buprenorphine Urine: NEGATIVE (ref <10)
Cannabinoid Scrn, Ur: NEGATIVE (ref <50)
Cocaine Metabolite Screen, Urine: NEGATIVE (ref <150)
FENTANYL SCREEN, URINE: NEGATIVE (ref <5)
Methadone Screen, Urine: NEGATIVE (ref <200)
Methamphetamine, Urine: NEGATIVE (ref <500)
Opiate Screen, Urine: NEGATIVE (ref <100)
Oxycodone Urine: NEGATIVE (ref <100)
Phencyclidine (PCP), Screen, Urine: NEGATIVE (ref <25)
Tricyclic Antidepressants, Urine: NEGATIVE (ref <300)

## 2023-12-18 NOTE — Care Coordination-Inpatient (Signed)
 Ambulatory Care Coordination Note     12/18/2023 11:39 AM     Patient outreach attempt by this ACM today to offer care management services. ACM was unable to reach the patient by telephone today;   left voice message requesting a return phone call to this ACM.     ACM: Woodie Karna Oxford, RN     Care Summary Note: E/D Visit at [facility]  on 12/16/23 for AMS. Care Coordination Program identified at this time, ACM added to Tx Team.      PCP/Specialist follow up:   Future Appointments         Provider Specialty Dept Phone    12/25/2023 8:30 AM (Arrive by 8:20 AM) [facility] Radiology [phone]    01/08/2024 9:20 AM Joshua Dorothyann SAILOR, APRN Family Medicine [phone]            Follow Up:   Plan for next ACM outreach in approximately 1-2 days  to complete:  - outreach attempt to offer care management services.     Kaitland Lewellyn Systems developer, RN  Respecting Choices Advanced Steps ACP Facilitator  Ambulatory Care Manager  [facility]  [phone]

## 2023-12-22 LAB — EKG 12-LEAD
P Axis: 24 degrees
P-R Interval: 200 ms
Q-T Interval: 428 ms
QRS Duration: 92 ms
QTc Calculation (Bazett): 414 ms
T Axis: 26 degrees

## 2023-12-22 NOTE — Care Coordination-Inpatient (Signed)
 Ambulatory Care Coordination Note     12/22/2023 1:46 PM     Patient outreach attempt by this ACM today to offer care management services. ACM was unable to reach the patient by telephone today;   left voice message requesting a return phone call to this ACM.     ACM: Woodie Karna Oxford, RN       PCP/Specialist follow up:   Future Appointments         Provider Specialty Dept Phone    12/25/2023 8:30 AM (Arrive by 8:20 AM) [facility] Radiology [phone]    01/08/2024 9:20 AM Joshua Dorothyann SAILOR, APRN Family Medicine [phone]            Follow Up:   Plan for next ACM outreach in approximately 1 week to complete:  - outreach attempt to offer care management services.       Zulma Court Systems developer, RN  Respecting Choices Advanced Steps ACP Facilitator  Ambulatory Care Manager  [facility]  [email or web address]

## 2023-12-25 ENCOUNTER — Ambulatory Visit: Payer: Medicare (Managed Care) | Primary: Family

## 2024-01-06 NOTE — Care Coordination-Inpatient (Signed)
 Ambulatory Care Coordination Note     01/06/2024 3:56 PM     patient outreach attempt by this ACM today to offer care management services. ACM was unable to reach the patient by telephone today;   left voice message requesting a return phone call to this ACM. UTR, [id number]     Patient closed (unable to reach patient) from the High Risk Care Management program on 01/06/2024.   No further Ambulatory Care Manager follow up scheduled.     Raymond Skinner Systems developer, RN  Respecting Choices Advanced Steps ACP Facilitator  Ambulatory Care Manager  [facility]  [email or web address]

## 2024-01-08 ENCOUNTER — Ambulatory Visit: Admit: 2024-01-08 | Discharge: 2024-01-08 | Payer: Medicare (Managed Care) | Attending: Family | Primary: Family

## 2024-01-08 LAB — CBC WITH AUTO DIFFERENTIAL
Basophils %: 0.9 % (ref 0.0–1.0)
Basophils Absolute: 0 K/uL (ref 0.00–0.20)
Eosinophils %: 1.8 % (ref 0.0–5.0)
Eosinophils Absolute: 0.1 K/uL (ref 0.00–0.60)
Hematocrit: 42.5 % (ref 42.0–52.0)
Hemoglobin: 14.4 g/dL (ref 14.0–18.0)
Immature Granulocytes #: 0 K/uL
Lymphocytes %: 36.1 % (ref 20.0–40.0)
Lymphocytes Absolute: 1.6 K/uL (ref 1.1–4.5)
MCH: 30.7 pg (ref 27.0–31.0)
MCHC: 33.9 g/dL (ref 33.0–37.0)
MCV: 90.6 fL (ref 80.0–94.0)
MPV: 10 fL (ref 9.4–12.4)
Monocytes %: 9.8 % (ref 0.0–10.0)
Monocytes Absolute: 0.4 K/uL (ref 0.00–0.90)
Neutrophils %: 51.2 % (ref 50.0–65.0)
Neutrophils Absolute: 2.3 K/uL (ref 1.5–7.5)
Platelets: 206 K/uL (ref 130–400)
RBC: 4.69 M/uL — ABNORMAL LOW (ref 4.70–6.10)
RDW: 12.9 % (ref 11.5–14.5)
WBC: 4.4 K/uL — ABNORMAL LOW (ref 4.8–10.8)

## 2024-01-08 LAB — COMPREHENSIVE METABOLIC PANEL
ALT: 83 U/L — ABNORMAL HIGH (ref 10–50)
AST: 41 U/L (ref 10–50)
Albumin: 4.4 g/dL (ref 3.5–5.2)
Alkaline Phosphatase: 84 U/L (ref 40–129)
Anion Gap: 11 mmol/L (ref 8–16)
BUN: 13 mg/dL (ref 8–23)
CO2: 26 mmol/L (ref 22–29)
Calcium: 9.8 mg/dL (ref 8.8–10.2)
Chloride: 104 mmol/L (ref 98–107)
Creatinine: 1 mg/dL (ref 0.7–1.2)
Est, Glom Filt Rate: 83 (ref >60)
Glucose: 112 mg/dL — ABNORMAL HIGH (ref 70–99)
Potassium: 4.5 mmol/L (ref 3.5–5.1)
Sodium: 141 mmol/L (ref 136–145)
Total Bilirubin: 0.5 mg/dL (ref 0.2–1.2)
Total Protein: 6.9 g/dL (ref 6.4–8.3)

## 2024-01-08 LAB — HEMOGLOBIN A1C: Hemoglobin A1C: 6 % — ABNORMAL HIGH (ref 4.0–5.6)

## 2024-01-08 LAB — LIPID PANEL
Cholesterol, Total: 183 mg/dL (ref 0–199)
HDL: 55 mg/dL (ref 40–60)
LDL Cholesterol: 112 mg/dL — ABNORMAL HIGH (ref <100)
Triglycerides: 80 mg/dL (ref 0–149)

## 2024-01-08 LAB — ALBUMIN/CREATININE RATIO, URINE
Albumin Urine: <1.2 mg/dL (ref 0.00–1.99)
Creatinine, Ur: 184 mg/dL (ref 39.0–259.0)

## 2024-01-08 LAB — VITAMIN D 25 HYDROXY: Vit D, 25-Hydroxy: 55.2 ng/mL (ref >=30)

## 2024-01-08 LAB — T4, FREE: T4 Free: 0.93 ng/dL (ref 0.93–1.70)

## 2024-01-08 LAB — PSA SCREENING: PSA: 2.43 ng/mL (ref 0.00–4.00)

## 2024-01-08 LAB — TSH: TSH: 1.81 u[IU]/mL (ref 0.270–4.200)

## 2024-01-08 MED ORDER — SIMVASTATIN 20 MG PO TABS
20 | ORAL_TABLET | Freq: Every evening | ORAL | 2 refills | 90.00000 days | Status: DC
Start: 2024-01-08 — End: 2024-02-29

## 2024-01-08 MED ORDER — MEMANTINE HCL 10 MG PO TABS
10 | ORAL_TABLET | Freq: Two times a day (BID) | ORAL | 11 refills | 22.50000 days | Status: DC
Start: 2024-01-08 — End: 2024-04-08

## 2024-01-08 MED ORDER — DONEPEZIL HCL 10 MG PO TABS
10 | ORAL_TABLET | Freq: Every evening | ORAL | 11 refills | 90.00000 days | Status: DC
Start: 2024-01-08 — End: 2024-04-08

## 2024-01-08 MED ORDER — DISULFIRAM 500 MG PO TABS
500 | ORAL_TABLET | Freq: Every day | ORAL | 5 refills | Status: DC
Start: 2024-01-08 — End: 2024-02-29
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
## 2024-01-08 NOTE — Patient Instructions (Addendum)
 Preventing Falls: Care Instructions  Injuries and health problems such as trouble walking or poor eyesight can increase your risk of falling. So can some medicines. But there are things you can do to help prevent falls. You can exercise to get stronger. You can also arrange your home to make it safer.    Talk to your doctor about the medicines you take. Ask if any of them increase the risk of falls and whether they can be changed or stopped.   Try to exercise regularly. It can help improve your strength and balance. This can help lower your risk of falling.         Practice fall safety and prevention.   Wear low-heeled shoes that fit well and give your feet good support. Talk to your doctor if you have foot problems that make this hard.  Carry a cellphone or wear a medical alert device that you can use to call for help.  Use stepladders instead of chairs to reach high objects. Don't climb if you're at risk for falls. Ask for help, if needed.  Wear the correct eyeglasses, if you need them.        Make your home safer.   Remove rugs, cords, clutter, and furniture from walkways.  Keep your house well lit. Use night-lights in hallways and bathrooms.  Install and use sturdy handrails on stairways.  Wear nonskid footwear, even inside. Don't walk barefoot or in socks without shoes.        Be safe outside.   Use handrails, curb cuts, and ramps whenever possible.  Keep your hands free by using a shoulder bag or backpack.  Try to walk in well-lit areas. Watch out for uneven ground, changes in pavement, and debris.  Be careful in the winter. Walk on the grass or gravel when sidewalks are slippery. Use de-icer on steps and walkways. Add non-slip devices to shoes.    Put grab bars and nonskid mats in your shower or tub and near the toilet. Try to use a shower chair or bath bench when bathing.   Get into a tub or shower by putting in your weaker leg first. Get out with your strong side first. Have a phone or medical alert  device in the bathroom with you.   Where can you learn more?  Go to [employer] and enter G117 to learn more about Preventing Falls: Care Instructions.  Current as of: December 17, 2022  Content Version: 14.5   [address] [place].   Care instructions adapted under license by [facility]. If you have questions about a medical condition or this instruction, always ask your healthcare professional. Romayne Alderman, [facility], disclaims any warranty or liability for your use of this information.         Learning About Mild Cognitive Impairment (MCI)  What is mild cognitive impairment (MCI)?     It's common to forget things sometimes as we get older. But some older people have memory loss that's more than normal aging. It's called mild cognitive impairment, or MCI. It is not the same as dementia.  People with the condition often know that their memory or mental function has changed. Tests may show some loss. But their minds work well overall. They can carry out daily tasks that are normal for them.  People with MCI have a higher chance of one day getting dementia. But not all people who have it will get dementia. Some people may stay the same over time.  What  are the symptoms?  People with MCI have more memory loss than what occurs with normal aging. They may have increasing trouble with recalling words and keeping up with conversations. They may also have trouble remembering important events and making decisions.  What puts you at risk?  The risk of getting MCI increases with age. Having high blood pressure or having a family history of MCI may also increase your risk.  How is it diagnosed?  Your doctor will do a physical exam.  You may be asked questions to check your memory and other mental skills. Your doctor may also talk to close friends and family members. This can help the doctor figure out how your memory and other mental skills have changed.  You may get blood tests and tests  that look at your brain.  These questions and tests can make sure you don't have other conditions that can cause symptoms like MCI. These include depression, sleep problems, and side effects from medicines.  How is it treated?  There are no medicines to treat MCI or to keep it from progressing to dementia. But treating conditions like high blood pressure and diabetes may help. A person with MCI needs routine follow-up visits with their doctor to check on changes in the person's mental skills.  How can you care for yourself at home?  Keeping your body active can help slow MCI. Exercises like walking can help. Try to stay active mentally too. Read or do things like crossword puzzles if you enjoy doing them.  If you need help coping with MCI, you may want to get support from family, friends, a support group, or a counselor who works with people who have MCI.  Though the future isn't always clear, it can be good to plan ahead with instructions for your care. These are called advanced directives. Having a plan can help make sure that you get the care you want.  Current as of: April 21, 2023  Content Version: 14.5   [address] [place].   Care instructions adapted under license by [facility]. If you have questions about a medical condition or this instruction, always ask your healthcare professional. Romayne Alderman, [facility], disclaims any warranty or liability for your use of this information.         Substance Use Disorder: Care Instructions  Overview     You can improve your life and health by stopping your use of alcohol or drugs. When you don't drink or use drugs, you may feel and sleep better. You may get along better with your family, friends, and coworkers. There are medicines and programs that can help with substance use disorder.  How can you care for yourself at home?  Here are some ways to help you stay sober and prevent relapse.  If you have been given medicine to help keep you sober or reduce  your cravings, be sure to take it exactly as prescribed.  Talk to your doctor about programs that can help you stop using drugs or drinking alcohol.  Do not keep alcohol or drugs in your home.  Plan ahead. Think about what you'll say if other people ask you to drink or use drugs. Try not to spend time with people who drink or use drugs.  Use the time and money spent on drinking or drugs to do something that's important to you.  Preventing a relapse  Have a plan to deal with relapse. Learn to recognize changes in your thinking that lead  you to drink or use drugs. Get help before you start to drink or use drugs again.  Try to stay away from situations, friends, or places that may lead you to drink or use drugs.  If you feel the need to drink alcohol or use drugs again, seek help right away. Call a trusted friend or family member. Some people get support from organizations such as Narcotics Anonymous or [employer] or from treatment facilities.  If you relapse, get help as soon as you can. Some people make a plan with another person that outlines what they want that person to do for them if they relapse. The plan usually includes how to handle the relapse and who to notify in case of relapse.  Don't give up. Remember that a relapse doesn't mean that you have failed. Use the experience to learn the triggers that lead you to drink or use drugs. Then quit again. Recovery is a lifelong process. Many people have several relapses before they are able to quit for good.  Follow-up care is a key part of your treatment and safety. Be sure to make and go to all appointments, and call your doctor if you are having problems. It's also a good idea to know your test results and keep a list of the medicines you take.  When should you call for help?   Call 911  anytime you think you may need emergency care. For example, call if you or someone else:    Has overdosed or has withdrawal signs. Be sure to tell the emergency workers that  you are or someone else is using or trying to quit using drugs. Overdose or withdrawal signs may include:  Losing consciousness.  Seizure.  Seeing or hearing things that aren't there (hallucinations).     Is thinking or talking about suicide or harming others.   Where to get help 24 hours a day, 7 days a week   If you or someone you know talks about suicide, self-harm, a mental health crisis, a substance use crisis, or any other kind of emotional distress, get help right away. You can:    Call the Suicide and Crisis Lifeline at 62.     Call 1-800-273-TALK (248-575-8835).     Text HOME to 531-386-8737 to access the Crisis Text Line.   Consider saving these numbers in your phone.  Go to 988lifeline.org for more information or to chat online.  Call your doctor now or seek immediate medical care if:    You are having withdrawal symptoms. These may include nausea or vomiting, sweating, shakiness, and anxiety.   Watch closely for changes in your health, and be sure to contact your doctor if:    You have a relapse.     You need more help or support to stop.   Where can you learn more?  Go to [employer] and enter H573 to learn more about Substance Use Disorder: Care Instructions.  Current as of: January 06, 2023  Content Version: 14.5   [address] [place].   Care instructions adapted under license by [facility]. If you have questions about a medical condition or this instruction, always ask your healthcare professional. Romayne Alderman, [facility], disclaims any warranty or liability for your use of this information.         Learning About Activities of Daily Living  What are activities of daily living?     Activities of daily living (ADLs) are the basic self-care tasks you do every  day. These include eating, bathing, dressing, and moving around.  As you age, and if you have health problems, you may find that it's harder to do some of these tasks. If so, your doctor can suggest ideas that  may help.  To measure what kind of help you may need, your doctor will ask how well you are able to do ADLs. Let your doctor know if there are any tasks that you are having trouble doing. This is an important first step to getting help. And when you have the help you need, you can stay as independent as possible.  How will a doctor assess your ADLs?  Asking about ADLs is part of a routine health checkup your doctor will likely do as you age. Your health check might be done in a doctor's office, in your home, or at a hospital. The goal is to find out if you are having any problems that could make it hard to care for yourself or that make it unsafe for you to be on your own.  To measure your ADLs, your doctor will ask how hard it is for you to do routine tasks. Your doctor may also want to know if you have changed the way you do a task because of a health problem. Your doctor may watch how you:  Walk back and forth.  Keep your balance while you stand or walk.  Move from sitting to standing or from a bed to a chair.  Button or unbutton a [employer] fast streamer.  Remove and put on your shoes.  It's common to feel a little worried or anxious if you find you can't do all the things you used to be able to do. Talking with your doctor about ADLs is a way to make sure you're as safe as possible and able to care for yourself as well as you can. You may want to bring a caregiver, friend, or family member to your checkup. They can help you talk to your doctor.  Follow-up care is a key part of your treatment and safety. Be sure to make and go to all appointments, and call your doctor if you are having problems. It's also a good idea to know your test results and keep a list of the medicines you take.  Current as of: March 12, 2023  Content Version: 14.5   [address] [place].   Care instructions adapted under license by [facility]. If you have questions about a medical condition or this instruction, always ask your  healthcare professional. Romayne Alderman, [facility], disclaims any warranty or liability for your use of this information.         Learning About Lung Cancer Screening  What is screening for lung cancer?     Lung cancer screening is a way to find some lung cancers early, before a person has any symptoms of the cancer.  Lung cancer screening may help those who have the highest risk for lung cancer--people age 32 and older who are or were heavy smokers. For most people, who aren't at increased risk, screening for lung cancer probably isn't helpful.  Screening won't prevent cancer. And it may not find all lung cancers. Lung cancer screening may lower the risk of dying from lung cancer in a small number of people.  How is it done?  Lung cancer screening is done with a low-dose CT (computed tomography) scan. A CT scan uses X-rays, or radiation, to make detailed pictures of your body.  Experts recommend that screening be done in medical centers that focus on finding and treating lung cancer.  Who is screening recommended for?  Lung cancer screening is recommended for people age 73 and older who are or were heavy smokers. That means people with a smoking history of at least 20 pack years. A pack year is a way to measure how heavy a smoker you are or were.  To figure out your pack years, multiply how many packs a day on average (assuming 20 cigarettes per pack) you have smoked by how many years you have smoked. For example:  If you smoked 1 pack a day for 20 years, that's 1 times 20. So you have a smoking history of 20 pack years.  If you smoked 2 packs a day for 10 years, that's 2 times 10. So you have a smoking history of 20 pack years.  Experts agree that screening is for people who have a high risk of lung cancer. But experts don't agree on what high risk means. Some say people age 22 or older with at least a 20-pack-year smoking history are high risk. Others say it's people age 74 or older with a 30-pack-year history.  To  see if you could benefit from screening, first find out if you are at high risk for lung cancer. Your doctor can help you decide your lung cancer risk.  What are the risks of screening?  CT screening for lung cancer isn't perfect. It can show an abnormal result when it turns out there wasn't any cancer. This is called a false-positive result. This means you may need more tests to make sure you don't have cancer. These tests can be harmful and cause a lot of worry.  These tests may include more CT scans and invasive testing like a lung biopsy. In a biopsy, the doctor takes a sample of tissue from inside your lung so it can be looked at under a microscope. A biopsy is the only way to tell if you have lung cancer. If the biopsy finds cancer, you and your doctor will have to decide how or whether to treat it.  Some lung cancers found on CT scans are harmless and would not have caused a problem if they had not been found through screening. But because doctors can't tell which ones will turn out to be harmless, most will be treated. This means that you may get treatment--including surgery, radiation, or chemotherapy--that you don't need.  There is a risk of damage to cells or tissue from being exposed to radiation, including the small amounts used in CTs, X-rays, and other medical tests. Over time, exposure to radiation may cause cancer and other health problems. But in most cases, the risk of getting cancer from being exposed to small amounts of radiation is low. It's not a reason to avoid these tests for most people.  What are the benefits of screening?  Your scan may be normal (negative).  For some people who are at higher risk, screening lowers the chance of dying of lung cancer. How much and how long you smoked helps to determine your risk level. Screening can find some cancers early, when treatment may be more likely to work.  What happens after screening?  The results of your CT scan will be sent to your doctor.  Someone from your care team will explain the results of your scan and answer any questions you may have. If you need any follow-up, he or she will  help you understand what to do next.  After a lung cancer screening, you can go back to your usual activities right away.  A lung cancer screening test can't tell if you have lung cancer. If your results are positive, your doctor can't tell whether an abnormal finding is a harmless nodule, cancer, or something else without doing more tests.  What can you do to help prevent lung cancer?  Some lung cancers can't be prevented. But if you smoke, quitting smoking is the best step you can take to prevent lung cancer. If you want to quit, your doctor can recommend medicines or other ways to help.  Follow-up care is a key part of your treatment and safety. Be sure to make and go to all appointments, and call your doctor if you are having problems. It's also a good idea to know your test results and keep a list of the medicines you take.  Where can you learn more?  Go to [employer] and enter Q940 to learn more about Learning About Lung Cancer Screening.  Current as of: March 13, 2023  Content Version: 14.5   [address], [place].   Care instructions adapted under license by [facility]. If you have questions about a medical condition or this instruction, always ask your healthcare professional. Romayne Alderman, [facility], disclaims any warranty or liability for your use of this information.         A Healthy Heart: Care Instructions  Overview     Coronary artery disease, also called heart disease, occurs when a substance called plaque builds up in the vessels that supply oxygen-rich blood to your heart muscle. This can narrow the blood vessels and reduce blood flow. A heart attack happens when blood flow is completely blocked. A high-fat diet, smoking, and other factors increase the risk of heart disease.  Your doctor has found that you have  a chance of having heart disease. A heart-healthy lifestyle can help keep your heart healthy and prevent heart disease. This lifestyle includes eating healthy, being active, staying at a weight that's healthy for you, and not smoking or using tobacco. It also includes taking medicines as directed, managing other health conditions, and trying to get a healthy amount of sleep.  Follow-up care is a key part of your treatment and safety. Be sure to make and go to all appointments, and call your doctor if you are having problems. It's also a good idea to know your test results and keep a list of the medicines you take.  How can you care for yourself at home?  Diet    Use less salt when you cook and eat. This helps lower your blood pressure. Taste food before salting. Add only a little salt when you think you need it. With time, your taste buds will adjust to less salt.     Eat fewer snack items, fast foods, canned soups, and other high-salt, high-fat, processed foods.     Read food labels and try to avoid saturated and trans fats. They increase your risk of heart disease by raising cholesterol levels.     Limit the amount of solid fat--butter, margarine, and shortening--you eat. Use olive, peanut, or canola oil when you cook. Bake, broil, and steam foods instead of frying them.     Eat a variety of fruit and vegetables every day. Dark green, deep orange, red, or yellow fruits and vegetables are especially good for you. Examples include spinach, carrots, peaches, and berries.  Foods high in fiber can reduce your cholesterol and provide important vitamins and minerals. High-fiber foods include whole-grain cereals and breads, oatmeal, beans, brown rice, citrus fruits, and apples.     Eat lean proteins. Heart-healthy proteins include seafood, lean meats and poultry, eggs, beans, peas, nuts, seeds, and soy products.     Limit drinks and foods with added sugar. These include candy, desserts, and soda pop.   Heart-healthy  lifestyle    If your doctor recommends it, get more exercise. For many people, walking is a good choice. Or you may want to swim, bike, or do other activities. Bit by bit, increase the time you're active every day. Try for at least 30 minutes on most days of the week.     Try to quit or cut back on using tobacco and other nicotine products. This includes smoking and vaping. If you need help quitting, talk to your doctor about stop-smoking programs and medicines. These can increase your chances of quitting for good. Quitting is one of the most important things you can do to protect your heart. It is never too late to quit. Try to avoid secondhand smoke too.     Stay at a weight that's healthy for you. Talk to your doctor if you need help losing weight.     Try to get 7 to 9 hours of sleep each night.     Limit alcohol to 2 drinks a day for men and 1 drink a day for women. Too much alcohol can cause health problems.     Manage other health problems such as diabetes, high blood pressure, and high cholesterol. If you think you may have a problem with alcohol or drug use, talk to your doctor.   Medicines    Take your medicines exactly as prescribed. Call your doctor if you think you are having a problem with your medicine.     If your doctor recommends aspirin, take the amount directed each day. Make sure you take aspirin and not another kind of pain reliever, such as acetaminophen (Tylenol).   When should you call for help?   Call 911 if you have symptoms of a heart attack. These may include:    Chest pain or pressure, or a strange feeling in the chest.     Sweating.     Shortness of breath.     Pain, pressure, or a strange feeling in the back, neck, jaw, or upper belly or in one or both shoulders or arms.     Lightheadedness or sudden weakness.     A fast or irregular heartbeat.   After you call 911, the operator may tell you to chew 1 adult-strength or 2 to 4 low-dose aspirin. Wait for an ambulance. Do not try to  drive yourself.  Watch closely for changes in your health, and be sure to contact your doctor if you have any problems.  Where can you learn more?  Go to [employer] and enter F075 to learn more about A Healthy Heart: Care Instructions.  Current as of: December 17, 2022  Content Version: 14.5   [address] [place].   Care instructions adapted under license by [facility]. If you have questions about a medical condition or this instruction, always ask your healthcare professional. Romayne Alderman, [facility], disclaims any warranty or liability for your use of this information.    Personalized Preventive Plan for Raymond Skinner - 01/08/2024  Medicare offers a range of preventive health benefits. Some  of the tests and screenings are paid in full while other may be subject to a deductible, co-insurance, and/or copay.  Some of these benefits include a comprehensive review of your medical history including lifestyle, illnesses that may run in your family, and various assessments and screenings as appropriate.  After reviewing your medical record and screening and assessments performed today your provider may have ordered immunizations, labs, imaging, and/or referrals for you.  A list of these orders (if applicable) as well as your Preventive Care list are included within your After Visit Summary for your review.           Preventing Falls: Care Instructions  Injuries and health problems such as trouble walking or poor eyesight can increase your risk of falling. So can some medicines. But there are things you can do to help prevent falls. You can exercise to get stronger. You can also arrange your home to make it safer.    Talk to your doctor about the medicines you take. Ask if any of them increase the risk of falls and whether they can be changed or stopped.   Try to exercise regularly. It can help improve your strength and balance. This can help lower your risk of falling.          Practice fall safety and prevention.   Wear low-heeled shoes that fit well and give your feet good support. Talk to your doctor if you have foot problems that make this hard.  Carry a cellphone or wear a medical alert device that you can use to call for help.  Use stepladders instead of chairs to reach high objects. Don't climb if you're at risk for falls. Ask for help, if needed.  Wear the correct eyeglasses, if you need them.        Make your home safer.   Remove rugs, cords, clutter, and furniture from walkways.  Keep your house well lit. Use night-lights in hallways and bathrooms.  Install and use sturdy handrails on stairways.  Wear nonskid footwear, even inside. Don't walk barefoot or in socks without shoes.        Be safe outside.   Use handrails, curb cuts, and ramps whenever possible.  Keep your hands free by using a shoulder bag or backpack.  Try to walk in well-lit areas. Watch out for uneven ground, changes in pavement, and debris.  Be careful in the winter. Walk on the grass or gravel when sidewalks are slippery. Use de-icer on steps and walkways. Add non-slip devices to shoes.    Put grab bars and nonskid mats in your shower or tub and near the toilet. Try to use a shower chair or bath bench when bathing.   Get into a tub or shower by putting in your weaker leg first. Get out with your strong side first. Have a phone or medical alert device in the bathroom with you.   Where can you learn more?  Go to [employer] and enter G117 to learn more about Preventing Falls: Care Instructions.  Current as of: December 17, 2022  Content Version: 14.5   [address] [place].   Care instructions adapted under license by [facility]. If you have questions about a medical condition or this instruction, always ask your healthcare professional. Romayne Alderman, [facility], disclaims any warranty or liability for your use of this information.         Learning About Mild Cognitive  Impairment (MCI)  What is mild cognitive impairment (MCI)?  It's common to forget things sometimes as we get older. But some older people have memory loss that's more than normal aging. It's called mild cognitive impairment, or MCI. It is not the same as dementia.  People with the condition often know that their memory or mental function has changed. Tests may show some loss. But their minds work well overall. They can carry out daily tasks that are normal for them.  People with MCI have a higher chance of one day getting dementia. But not all people who have it will get dementia. Some people may stay the same over time.  What are the symptoms?  People with MCI have more memory loss than what occurs with normal aging. They may have increasing trouble with recalling words and keeping up with conversations. They may also have trouble remembering important events and making decisions.  What puts you at risk?  The risk of getting MCI increases with age. Having high blood pressure or having a family history of MCI may also increase your risk.  How is it diagnosed?  Your doctor will do a physical exam.  You may be asked questions to check your memory and other mental skills. Your doctor may also talk to close friends and family members. This can help the doctor figure out how your memory and other mental skills have changed.  You may get blood tests and tests that look at your brain.  These questions and tests can make sure you don't have other conditions that can cause symptoms like MCI. These include depression, sleep problems, and side effects from medicines.  How is it treated?  There are no medicines to treat MCI or to keep it from progressing to dementia. But treating conditions like high blood pressure and diabetes may help. A person with MCI needs routine follow-up visits with their doctor to check on changes in the person's mental skills.  How can you care for yourself at home?  Keeping your body active can  help slow MCI. Exercises like walking can help. Try to stay active mentally too. Read or do things like crossword puzzles if you enjoy doing them.  If you need help coping with MCI, you may want to get support from family, friends, a support group, or a counselor who works with people who have MCI.  Though the future isn't always clear, it can be good to plan ahead with instructions for your care. These are called advanced directives. Having a plan can help make sure that you get the care you want.  Current as of: April 21, 2023  Content Version: 14.5   [address] [place].   Care instructions adapted under license by [facility]. If you have questions about a medical condition or this instruction, always ask your healthcare professional. Romayne Alderman, [facility], disclaims any warranty or liability for your use of this information.         Substance Use Disorder: Care Instructions  Overview     You can improve your life and health by stopping your use of alcohol or drugs. When you don't drink or use drugs, you may feel and sleep better. You may get along better with your family, friends, and coworkers. There are medicines and programs that can help with substance use disorder.  How can you care for yourself at home?  Here are some ways to help you stay sober and prevent relapse.  If you have been given medicine to help keep you sober or reduce your  cravings, be sure to take it exactly as prescribed.  Talk to your doctor about programs that can help you stop using drugs or drinking alcohol.  Do not keep alcohol or drugs in your home.  Plan ahead. Think about what you'll say if other people ask you to drink or use drugs. Try not to spend time with people who drink or use drugs.  Use the time and money spent on drinking or drugs to do something that's important to you.  Preventing a relapse  Have a plan to deal with relapse. Learn to recognize changes in your thinking that lead you to drink or use  drugs. Get help before you start to drink or use drugs again.  Try to stay away from situations, friends, or places that may lead you to drink or use drugs.  If you feel the need to drink alcohol or use drugs again, seek help right away. Call a trusted friend or family member. Some people get support from organizations such as Narcotics Anonymous or [employer] or from treatment facilities.  If you relapse, get help as soon as you can. Some people make a plan with another person that outlines what they want that person to do for them if they relapse. The plan usually includes how to handle the relapse and who to notify in case of relapse.  Don't give up. Remember that a relapse doesn't mean that you have failed. Use the experience to learn the triggers that lead you to drink or use drugs. Then quit again. Recovery is a lifelong process. Many people have several relapses before they are able to quit for good.  Follow-up care is a key part of your treatment and safety. Be sure to make and go to all appointments, and call your doctor if you are having problems. It's also a good idea to know your test results and keep a list of the medicines you take.  When should you call for help?   Call 911  anytime you think you may need emergency care. For example, call if you or someone else:    Has overdosed or has withdrawal signs. Be sure to tell the emergency workers that you are or someone else is using or trying to quit using drugs. Overdose or withdrawal signs may include:  Losing consciousness.  Seizure.  Seeing or hearing things that aren't there (hallucinations).     Is thinking or talking about suicide or harming others.   Where to get help 24 hours a day, 7 days a week   If you or someone you know talks about suicide, self-harm, a mental health crisis, a substance use crisis, or any other kind of emotional distress, get help right away. You can:    Call the Suicide and Crisis Lifeline at 67.     Call 1-800-273-TALK  (703 876 6093).     Text HOME to 575 260 2910 to access the Crisis Text Line.   Consider saving these numbers in your phone.  Go to 988lifeline.org for more information or to chat online.  Call your doctor now or seek immediate medical care if:    You are having withdrawal symptoms. These may include nausea or vomiting, sweating, shakiness, and anxiety.   Watch closely for changes in your health, and be sure to contact your doctor if:    You have a relapse.     You need more help or support to stop.   Where can you learn more?  Go to [employer] and  enter H573 to learn more about Substance Use Disorder: Care Instructions.  Current as of: January 06, 2023  Content Version: 14.5   [address] [place].   Care instructions adapted under license by [facility]. If you have questions about a medical condition or this instruction, always ask your healthcare professional. Romayne Alderman, [facility], disclaims any warranty or liability for your use of this information.         Learning About Activities of Daily Living  What are activities of daily living?     Activities of daily living (ADLs) are the basic self-care tasks you do every day. These include eating, bathing, dressing, and moving around.  As you age, and if you have health problems, you may find that it's harder to do some of these tasks. If so, your doctor can suggest ideas that may help.  To measure what kind of help you may need, your doctor will ask how well you are able to do ADLs. Let your doctor know if there are any tasks that you are having trouble doing. This is an important first step to getting help. And when you have the help you need, you can stay as independent as possible.  How will a doctor assess your ADLs?  Asking about ADLs is part of a routine health checkup your doctor will likely do as you age. Your health check might be done in a doctor's office, in your home, or at a hospital. The goal is to find out if you  are having any problems that could make it hard to care for yourself or that make it unsafe for you to be on your own.  To measure your ADLs, your doctor will ask how hard it is for you to do routine tasks. Your doctor may also want to know if you have changed the way you do a task because of a health problem. Your doctor may watch how you:  Walk back and forth.  Keep your balance while you stand or walk.  Move from sitting to standing or from a bed to a chair.  Button or unbutton a [employer] fast streamer.  Remove and put on your shoes.  It's common to feel a little worried or anxious if you find you can't do all the things you used to be able to do. Talking with your doctor about ADLs is a way to make sure you're as safe as possible and able to care for yourself as well as you can. You may want to bring a caregiver, friend, or family member to your checkup. They can help you talk to your doctor.  Follow-up care is a key part of your treatment and safety. Be sure to make and go to all appointments, and call your doctor if you are having problems. It's also a good idea to know your test results and keep a list of the medicines you take.  Current as of: March 12, 2023  Content Version: 14.5   [address] [place].   Care instructions adapted under license by [facility]. If you have questions about a medical condition or this instruction, always ask your healthcare professional. Romayne Alderman, [facility], disclaims any warranty or liability for your use of this information.         Learning About Lung Cancer Screening  What is screening for lung cancer?     Lung cancer screening is a way to find some lung cancers early, before a person has any symptoms of  the cancer.  Lung cancer screening may help those who have the highest risk for lung cancer--people age 41 and older who are or were heavy smokers. For most people, who aren't at increased risk, screening for lung cancer probably isn't helpful.  Screening  won't prevent cancer. And it may not find all lung cancers. Lung cancer screening may lower the risk of dying from lung cancer in a small number of people.  How is it done?  Lung cancer screening is done with a low-dose CT (computed tomography) scan. A CT scan uses X-rays, or radiation, to make detailed pictures of your body. Experts recommend that screening be done in medical centers that focus on finding and treating lung cancer.  Who is screening recommended for?  Lung cancer screening is recommended for people age 17 and older who are or were heavy smokers. That means people with a smoking history of at least 20 pack years. A pack year is a way to measure how heavy a smoker you are or were.  To figure out your pack years, multiply how many packs a day on average (assuming 20 cigarettes per pack) you have smoked by how many years you have smoked. For example:  If you smoked 1 pack a day for 20 years, that's 1 times 20. So you have a smoking history of 20 pack years.  If you smoked 2 packs a day for 10 years, that's 2 times 10. So you have a smoking history of 20 pack years.  Experts agree that screening is for people who have a high risk of lung cancer. But experts don't agree on what high risk means. Some say people age 32 or older with at least a 20-pack-year smoking history are high risk. Others say it's people age 70 or older with a 30-pack-year history.  To see if you could benefit from screening, first find out if you are at high risk for lung cancer. Your doctor can help you decide your lung cancer risk.  What are the risks of screening?  CT screening for lung cancer isn't perfect. It can show an abnormal result when it turns out there wasn't any cancer. This is called a false-positive result. This means you may need more tests to make sure you don't have cancer. These tests can be harmful and cause a lot of worry.  These tests may include more CT scans and invasive testing like a lung biopsy. In a biopsy,  the doctor takes a sample of tissue from inside your lung so it can be looked at under a microscope. A biopsy is the only way to tell if you have lung cancer. If the biopsy finds cancer, you and your doctor will have to decide how or whether to treat it.  Some lung cancers found on CT scans are harmless and would not have caused a problem if they had not been found through screening. But because doctors can't tell which ones will turn out to be harmless, most will be treated. This means that you may get treatment--including surgery, radiation, or chemotherapy--that you don't need.  There is a risk of damage to cells or tissue from being exposed to radiation, including the small amounts used in CTs, X-rays, and other medical tests. Over time, exposure to radiation may cause cancer and other health problems. But in most cases, the risk of getting cancer from being exposed to small amounts of radiation is low. It's not a reason to avoid these tests for most people.  What are the benefits of screening?  Your scan may be normal (negative).  For some people who are at higher risk, screening lowers the chance of dying of lung cancer. How much and how long you smoked helps to determine your risk level. Screening can find some cancers early, when treatment may be more likely to work.  What happens after screening?  The results of your CT scan will be sent to your doctor. Someone from your care team will explain the results of your scan and answer any questions you may have. If you need any follow-up, he or she will help you understand what to do next.  After a lung cancer screening, you can go back to your usual activities right away.  A lung cancer screening test can't tell if you have lung cancer. If your results are positive, your doctor can't tell whether an abnormal finding is a harmless nodule, cancer, or something else without doing more tests.  What can you do to help prevent lung cancer?  Some lung cancers can't be  prevented. But if you smoke, quitting smoking is the best step you can take to prevent lung cancer. If you want to quit, your doctor can recommend medicines or other ways to help.  Follow-up care is a key part of your treatment and safety. Be sure to make and go to all appointments, and call your doctor if you are having problems. It's also a good idea to know your test results and keep a list of the medicines you take.  Where can you learn more?  Go to [employer] and enter Q940 to learn more about Learning About Lung Cancer Screening.  Current as of: March 13, 2023  Content Version: 14.5   [address] [place].   Care instructions adapted under license by [facility]. If you have questions about a medical condition or this instruction, always ask your healthcare professional. Romayne Alderman, [facility], disclaims any warranty or liability for your use of this information.         A Healthy Heart: Care Instructions  Overview     Coronary artery disease, also called heart disease, occurs when a substance called plaque builds up in the vessels that supply oxygen-rich blood to your heart muscle. This can narrow the blood vessels and reduce blood flow. A heart attack happens when blood flow is completely blocked. A high-fat diet, smoking, and other factors increase the risk of heart disease.  Your doctor has found that you have a chance of having heart disease. A heart-healthy lifestyle can help keep your heart healthy and prevent heart disease. This lifestyle includes eating healthy, being active, staying at a weight that's healthy for you, and not smoking or using tobacco. It also includes taking medicines as directed, managing other health conditions, and trying to get a healthy amount of sleep.  Follow-up care is a key part of your treatment and safety. Be sure to make and go to all appointments, and call your doctor if you are having problems. It's also a good idea to know  your test results and keep a list of the medicines you take.  How can you care for yourself at home?  Diet    Use less salt when you cook and eat. This helps lower your blood pressure. Taste food before salting. Add only a little salt when you think you need it. With time, your taste buds will adjust to less salt.     Eat fewer snack items,  fast foods, canned soups, and other high-salt, high-fat, processed foods.     Read food labels and try to avoid saturated and trans fats. They increase your risk of heart disease by raising cholesterol levels.     Limit the amount of solid fat--butter, margarine, and shortening--you eat. Use olive, peanut, or canola oil when you cook. Bake, broil, and steam foods instead of frying them.     Eat a variety of fruit and vegetables every day. Dark green, deep orange, red, or yellow fruits and vegetables are especially good for you. Examples include spinach, carrots, peaches, and berries.     Foods high in fiber can reduce your cholesterol and provide important vitamins and minerals. High-fiber foods include whole-grain cereals and breads, oatmeal, beans, brown rice, citrus fruits, and apples.     Eat lean proteins. Heart-healthy proteins include seafood, lean meats and poultry, eggs, beans, peas, nuts, seeds, and soy products.     Limit drinks and foods with added sugar. These include candy, desserts, and soda pop.   Heart-healthy lifestyle    If your doctor recommends it, get more exercise. For many people, walking is a good choice. Or you may want to swim, bike, or do other activities. Bit by bit, increase the time you're active every day. Try for at least 30 minutes on most days of the week.     Try to quit or cut back on using tobacco and other nicotine products. This includes smoking and vaping. If you need help quitting, talk to your doctor about stop-smoking programs and medicines. These can increase your chances of quitting for good. Quitting is one of the most important  things you can do to protect your heart. It is never too late to quit. Try to avoid secondhand smoke too.     Stay at a weight that's healthy for you. Talk to your doctor if you need help losing weight.     Try to get 7 to 9 hours of sleep each night.     Limit alcohol to 2 drinks a day for men and 1 drink a day for women. Too much alcohol can cause health problems.     Manage other health problems such as diabetes, high blood pressure, and high cholesterol. If you think you may have a problem with alcohol or drug use, talk to your doctor.   Medicines    Take your medicines exactly as prescribed. Call your doctor if you think you are having a problem with your medicine.     If your doctor recommends aspirin, take the amount directed each day. Make sure you take aspirin and not another kind of pain reliever, such as acetaminophen (Tylenol).   When should you call for help?   Call 911 if you have symptoms of a heart attack. These may include:    Chest pain or pressure, or a strange feeling in the chest.     Sweating.     Shortness of breath.     Pain, pressure, or a strange feeling in the back, neck, jaw, or upper belly or in one or both shoulders or arms.     Lightheadedness or sudden weakness.     A fast or irregular heartbeat.   After you call 911, the operator may tell you to chew 1 adult-strength or 2 to 4 low-dose aspirin. Wait for an ambulance. Do not try to drive yourself.  Watch closely for changes in your health, and be sure to contact your doctor  if you have any problems.  Where can you learn more?  Go to [employer] and enter F075 to learn more about A Healthy Heart: Care Instructions.  Current as of: December 17, 2022  Content Version: 14.5   [address] [place].   Care instructions adapted under license by [facility]. If you have questions about a medical condition or this instruction, always ask your healthcare professional. Romayne Alderman, [facility], disclaims any  warranty or liability for your use of this information.    Personalized Preventive Plan for Raymond Skinner - 01/08/2024  Medicare offers a range of preventive health benefits. Some of the tests and screenings are paid in full while other may be subject to a deductible, co-insurance, and/or copay.  Some of these benefits include a comprehensive review of your medical history including lifestyle, illnesses that may run in your family, and various assessments and screenings as appropriate.  After reviewing your medical record and screening and assessments performed today your provider may have ordered immunizations, labs, imaging, and/or referrals for you.  A list of these orders (if applicable) as well as your Preventive Care list are included within your After Visit Summary for your review.

## 2024-01-11 NOTE — Telephone Encounter (Signed)
 Called pharmacy and gave them approval to fill 250mg  BID per provider.

## 2024-01-11 NOTE — Telephone Encounter (Signed)
 Pharmacy called stating they cannot get Disulfiram  in 500mg  tabs but can do 250mg  tabs.    Will send to provider for approval.

## 2024-01-13 DIAGNOSIS — F05 Delirium due to known physiological condition: Secondary | ICD-10-CM

## 2024-01-13 DIAGNOSIS — S72011A Unspecified intracapsular fracture of right femur, initial encounter for closed fracture: Secondary | ICD-10-CM

## 2024-01-13 NOTE — ED Provider Notes (Signed)
 [facility] EMERGENCY DEPARTMENT  EMERGENCY DEPARTMENT ENCOUNTER      [name]  MRN: [id number]  Birthdate 12-Nov-1955  Date of evaluation: 01/13/2024  Provider: Norleen FORBES Lyle Mickey, MD    CHIEF COMPLAINT       Chief Complaint   Patient presents with    Altered Mental Status     Pt family reports that pt has had increased AMS. Pt has hx of Dementia, family at home cannot take care of patient looking for placement.          HISTORY OF PRESENT ILLNESS   (Location/Symptom, Timing/Onset,Context/Setting, Quality, Duration, Modifying Factors, Severity)  Note limiting factors.   Raymond Skinner is a 68 y.o. male who presents to the emergency department for evaluation after having several falls last night and today, associated with increased confusion and memory impairment.  Has dementia and has had significant decline recently according to family member.  Most history provided by sister-in-law who accompanies him.  Has not had any recent symptoms of illness or infection.  She says he has been talking out of his head all day today.  He had a couple falls in the shower last night and then fell again today.  Family was unable to get him up.  They report he complained of hip pain earlier but denies any pain currently.  Denies any neck or back pain.  Unsure if he hit his head last night.  Family has already been in contact with [facility] nursing and rehab for possible placement there.  Patient is set to be started on Medicare on September 1.  After discussion with the facility and insurance providers, they were advised patient would require 3 midnights in the hospital prior to placement    HPI    NursingNotes were reviewed.    REVIEW OF SYSTEMS    (2-9 systems for level 4, 10 or more for level 5)     Review of Systems   Unable to perform ROS: Dementia   Musculoskeletal:  Positive for arthralgias.       A complete review of systems was performed and is negative except as noted above in the HPI.       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 No pertinent surgical history.      CURRENT MEDICATIONS       Previous Medications    ASPIRIN 81 MG CHEWABLE TABLET    Take 1 tablet by mouth daily    CYANOCOBALAMIN 1000 MCG TABLET    Take 2.5 tablets by mouth daily    DISULFIRAM  500 MG TABS    Take 1 tablet by mouth daily    DONEPEZIL  (ARICEPT ) 10 MG TABLET    Take 1 tablet by mouth nightly    GINKGO 60 MG TABS    Take by mouth    MAGNESIUM 30 MG TABLET    Take 250 mg by mouth daily    MEMANTINE  (NAMENDA ) 10 MG TABLET    Take 1 tablet by mouth 2 times daily    MULTIPLE VITAMINS-MINERALS (THERAPEUTIC MULTIVITAMIN-MINERALS) TABLET    Take 1 tablet by mouth daily    POTASSIUM 99 MG TABS    Take by mouth    SIMVASTATIN  (ZOCOR ) 20  MG TABLET    Take 1 tablet by mouth nightly    VITAMIN B-6 (PYRIDOXINE) 100 MG TABLET    Take 1 tablet by mouth daily    VITAMIN D-VITAMIN K (VITAMIN K2-VITAMIN D3 PO)    Take 90 mcg by mouth daily    VITAMIN E 1000 UNITS CAPSULE    Take 1 capsule by mouth daily    ZINC GLUCONATE 50 MG TABLET    Take 1 tablet by mouth daily       ALLERGIES     Patient has no known allergies.    FAMILY HISTORY       Family History   Problem Relation Age of Onset    Diabetes Mother     Dementia Father     No Known Problems Maternal Uncle     Dementia Paternal Aunt     Dementia Maternal Grandmother     Dementia Maternal Grandfather     Dementia Paternal Grandmother     Dementia Paternal Grandfather           SOCIAL HISTORY       Social History     Socioeconomic History    Marital status: Divorced     Spouse name: None    Number of children: None    Years of education: None    Highest education level: None   Tobacco Use    Smoking status: Former     Current packs/day: 0.00     Average packs/day: 0.5 packs/day for 42.4 years (21.2 ttl pk-yrs)     Types: Cigarettes     Start date: 12/31/1974     Quit date: 2019      Years since quitting: 6.6    Smokeless tobacco: Never   Vaping Use    Vaping status: Never Used   Substance and Sexual Activity    Alcohol use: Never    Drug use: Yes     Types: Marijuana Oda)     Social Drivers of Psychologist, prison and probation services Strain: Low Risk  (11/05/2022)    Overall Financial Resource Strain (CARDIA)     Difficulty of Paying Living Expenses: Not hard at all   Food Insecurity: No Food Insecurity (01/08/2024)    Hunger Vital Sign     Worried About Running Out of Food in the Last Year: Never true     Ran Out of Food in the Last Year: Never true   Transportation Needs: No Transportation Needs (01/08/2024)    PRAPARE - Therapist, art (Medical): No     Lack of Transportation (Non-Medical): No   Physical Activity: Insufficiently Active (01/08/2024)    Exercise Vital Sign     Days of Exercise per Week: 5 days     Minutes of Exercise per Session: 10 min   Housing Stability: Low Risk  (01/08/2024)    Housing Stability Vital Sign     Unable to Pay for Housing in the Last Year: No     Number of Times Moved in the Last Year: 0     Homeless in the Last Year: No       SCREENINGS    Glasgow Coma Scale  Eye Opening: Spontaneous  Best Verbal Response: Confused  Best Motor Response: Obeys commands  Glasgow Coma Scale Score: 14        PHYSICAL EXAM    (up to 7 for level 4, 8 or more for level 5)  ED Triage Vitals   BP Girls Systolic BP Percentile Girls Diastolic BP Percentile Boys Systolic BP Percentile Boys Diastolic BP Percentile Temp Temp Source Pulse   01/13/24 2341 -- -- -- -- 01/13/24 2345 01/13/24 2345 01/13/24 2341   104/88     98.6 F (37 C) Temporal 70      Respirations SpO2 Height Weight       01/13/24 2341 01/13/24 2341 -- --       20 95 %             Physical Exam  Vitals and nursing note reviewed.   Constitutional:       General: He is not in acute distress.     Appearance: He is well-developed. He is not toxic-appearing or diaphoretic.   HENT:      Head: Normocephalic  and atraumatic.      Mouth/Throat:      Mouth: Mucous membranes are moist.      Pharynx: Oropharynx is clear.   Eyes:      General: No scleral icterus.        Right eye: No discharge.         Left eye: No discharge.      Pupils: Pupils are equal, round, and reactive to light.   Cardiovascular:      Rate and Rhythm: Normal rate and regular rhythm.   Pulmonary:      Effort: Pulmonary effort is normal. No respiratory distress.      Breath sounds: No stridor.   Abdominal:      General: There is no distension.   Musculoskeletal:         General: No deformity. Normal range of motion.      Cervical back: Normal range of motion.   Skin:     General: Skin is warm and dry.   Neurological:      General: No focal deficit present.      Mental Status: He is alert.      GCS: GCS eye subscore is 4. GCS verbal subscore is 4. GCS motor subscore is 6.      Cranial Nerves: No cranial nerve deficit.      Motor: No abnormal muscle tone.   Psychiatric:         Behavior: Behavior normal.         Thought Content: Thought content normal.         Cognition and Memory: Memory is impaired.         Judgment: Judgment normal.         DIAGNOSTIC RESULTS       RADIOLOGY:   Non-plain film images such as CT, Ultrasound and MRI are read by the radiologist. Plainradiographic images are visualized and preliminarily interpreted by the emergency physician with the below findings:      Interpretation per the Radiologist below, if available at the time of this note:    XR CHEST PORTABLE   Final Result       1. No acute findings.                       ______________________________________    Electronically signed by: GUSTAVO HIGHTOWER M.D.   Date:     01/14/2024   Time:    01:22       XR HIP 2-3 VW W PELVIS RIGHT   Final Result       Subcapital femoral neck fracture with lateral impaction.  ______________________________________    Electronically signed by: GUSTAVO HIGHTOWER M.D.   Date:     01/14/2024   Time:    01:22       CT HEAD WO CONTRAST   Final  Result   1. No acute intracranial process.   2. Stable atrophy and chronic small vessel ischemic disease        All CT scans are performed using dose optimization techniques as appropriate to the performed exam and include    at least one of the following: Automated exposure control, adjustment of the mA and/or kV according to size, and the use of iterative reconstruction technique.        ______________________________________    Electronically signed by: KANDI BEND M.D.   Date:     01/14/2024   Time:    00:37       XR CHEST (2 VW)    (Results Pending)   CT PELVIS WO CONTRAST Additional Contrast? None    (Results Pending)         ED BEDSIDE ULTRASOUND:   Performed by ED Physician - none    LABS:  Labs Reviewed   CBC WITH AUTO DIFFERENTIAL - Abnormal; Notable for the following components:       Result Value    RBC 4.48 (*)     Hemoglobin 13.9 (*)     Hematocrit 40.5 (*)     MPV 9.0 (*)     Neutrophils % 72.4 (*)     Lymphocytes % 18.7 (*)     All other components within normal limits   COMPREHENSIVE METABOLIC PANEL - Abnormal; Notable for the following components:    Glucose 170 (*)     ALT 55 (*)     All other components within normal limits   MAGNESIUM   ETHANOL   PROTIME-INR   APTT   VITAMIN D 25 HYDROXY   DRUG SCRN, BUPRENORPHINE   URINALYSIS WITH REFLEX TO CULTURE   TYPE AND SCREEN       All other labs were within normal range or not returned as of this dictation.    Medications   hydrALAZINE (APRESOLINE) injection 10 mg (has no administration in time range)   morphine (PF) injection 1 mg (has no administration in time range)   morphine (PF) injection 2 mg (has no administration in time range)   morphine sulfate (PF) injection 4 mg (has no administration in time range)   oxyCODONE (ROXICODONE) immediate release tablet 2.5 mg (has no administration in time range)   oxyCODONE (ROXICODONE) immediate release tablet 5 mg (has no administration in time range)   oxyCODONE (ROXICODONE) immediate release tablet  10 mg (has no administration in time range)   naloxone (NARCAN) injection 0.4 mg (has no administration in time range)   acetaminophen (TYLENOL) tablet 1,000 mg (has no administration in time range)   ondansetron (ZOFRAN) injection 4 mg (has no administration in time range)   promethazine (PHENERGAN) injection 12.5 mg (has no administration in time range)   promethazine (PHENERGAN) tablet 12.5 mg (has no administration in time range)   aspirin chewable tablet 81 mg (has no administration in time range)   vitamin B-12 (CYANOCOBALAMIN) tablet 2,000 mcg (has no administration in time range)   disulfiram  (ANTABUSE ) tablet 500 mg (has no administration in time range)   donepezil  (ARICEPT ) tablet 10 mg (has no administration in time range)   magnesium tablet 30 mg (has no administration in time range)   memantine  (NAMENDA ) tablet 10  mg (has no administration in time range)   therapeutic multivitamin-minerals 1 tablet (has no administration in time range)   atorvastatin (LIPITOR) tablet 10 mg (has no administration in time range)   vitamin B-6 (PYRIDOXINE) tablet 100 mg (has no administration in time range)   vitamin D (CHOLECALCIFEROL) capsule 5,000 Units (has no administration in time range)   vitamin E capsule 800 Units (has no administration in time range)   zinc sulfate (ZINCATE) 220 mg capsule - elemental zinc 50 mg (has no administration in time range)       EMERGENCY DEPARTMENT COURSE and DIFFERENTIALDIAGNOSIS/MDM:   Vitals:    Vitals:    01/13/24 2341 01/13/24 2345   BP: 104/88    Pulse: 70    Resp: 20    Temp:  98.6 F (37 C)   TempSrc:  Temporal   SpO2: 95%          PROCEDURES:  Unless otherwise notedbelow, none  Procedures    EKG: All EKG's are interpreted by the Emergency Department Physician who either signs or Co-signs this chart in the absence of a cardiologist.      MDM      ED Course as of 01/14/24 0136   Thu Jan 14, 2024   0003 WBC: 7.3 [JP]   0034 Head CT per my independent review and interpretation  negative for acute intracranial pathology or traumatic injury [JP]   0100 X-ray pelvis and right hip are my dependent review and interpretation shows lucency in the right subcapital femur area but does not appear to be a significant cortical irregularity.  CT ordered for further evaluation of possible subcapital femur fracture [JP]      ED Course User Index  [JP] Lyle Norleen FORBES Mickey., MD     Radiologist confirms subcapital right femoral neck fracture.  CT was performed for further evaluation due to uncertainty on x-ray initially.  Laboratory evaluation unremarkable.  Discussed case with Dr. Tobie with orthopedic surgery.  Consult placed    Based on the evaluation and work-up here patient is felt to require further monitoring, work-up, or treatment that is available in the emergency department.  Case was discussed with hospitalist who agrees for observation or admission for further management.  Treatment and stabilization as necessary were provided in the emergency department prior to transfer of care to the medicine service.      CONSULTS:  IP CONSULT TO CASE MANAGEMENT  IP CONSULT TO ORTHOPEDIC SURGERY  IP CONSULT TO ORTHOPEDIC SURGERY        FINAL IMPRESSION     1. Delirium superimposed on dementia    2. Fall from standing, initial encounter    3. Closed subcapital fracture of right femur, initial encounter [facility])          DISPOSITION/PLAN   DISPOSITION Admitted 01/14/2024 12:36:53 AM               No notes of EC Admission Criteria type on file.    PATIENT REFERRED TO:  No follow-up provider specified.    DISCHARGE MEDICATIONS:  New Prescriptions    No medications on file          (Please note that portions of this note were completed with a voice recognition program.  Efforts were made to edit the dictations butoccasionally words are mis-transcribed.)    Wisdom Seybold E Pierce Jr, MD (electronically signed)  AttendingEmergency Physician          Lyle Norleen FORBES Mickey., MD  01/14/24 [phone]

## 2024-01-14 ENCOUNTER — Inpatient Hospital Stay: Payer: Medicare (Managed Care) | Primary: Family

## 2024-01-14 ENCOUNTER — Emergency Department: Admit: 2024-01-14 | Payer: Medicare (Managed Care) | Primary: Family

## 2024-01-14 ENCOUNTER — Inpatient Hospital Stay: Admit: 2024-01-14 | Payer: Medicare (Managed Care) | Primary: Family

## 2024-01-14 ENCOUNTER — Inpatient Hospital Stay
Admit: 2024-01-14 | Discharge: 2024-01-19 | Disposition: A | Discharge status: Skilled Nursing Facility | Payer: Medicare (Managed Care) | Admitting: Student in an Organized Health Care Education/Training Program

## 2024-01-14 DIAGNOSIS — F05 Delirium due to known physiological condition: Secondary | ICD-10-CM

## 2024-01-14 DIAGNOSIS — S72011A Unspecified intracapsular fracture of right femur, initial encounter for closed fracture: Secondary | ICD-10-CM

## 2024-01-14 LAB — URINALYSIS WITH REFLEX TO CULTURE
Bilirubin, Urine: NEGATIVE
Blood, Urine: NEGATIVE
Glucose, Ur: 100 mg/dL — AB
Leukocyte Esterase, Urine: NEGATIVE
Nitrite, Urine: NEGATIVE
Specific Gravity, UA: 1.04 (ref 1.005–1.030)
Urobilinogen, Urine: 1 U/dL (ref <2.0)
pH, Urine: 6 (ref 5.0–8.0)

## 2024-01-14 LAB — EKG 12-LEAD
P Axis: 35 degrees
P-R Interval: 178 ms
Q-T Interval: 390 ms
QRS Duration: 98 ms
QTc Calculation (Bazett): 397 ms
T Axis: 37 degrees

## 2024-01-14 LAB — PROTIME-INR
INR: 1.09 (ref 0.88–1.18)
Protime: 14 s (ref 12.0–14.6)

## 2024-01-14 LAB — DRUG SCRN, BUPRENORPHINE
Amphetamine Screen, Ur: NEGATIVE (ref <500)
Barbiturate Screen, Ur: NEGATIVE (ref <200)
Benzodiazepine Screen, Urine: NEGATIVE (ref <150)
Buprenorphine Urine: NEGATIVE (ref <10)
Cannabinoid Scrn, Ur: NEGATIVE (ref <50)
Cocaine Metabolite Screen, Urine: NEGATIVE (ref <150)
FENTANYL SCREEN, URINE: NEGATIVE (ref <5)
Methadone Screen, Urine: NEGATIVE (ref <200)
Methamphetamine, Urine: NEGATIVE (ref <500)
Opiate Screen, Urine: NEGATIVE (ref <100)
Oxycodone Urine: NEGATIVE (ref <100)
Phencyclidine (PCP), Screen, Urine: NEGATIVE (ref <25)
Tricyclic Antidepressants, Urine: NEGATIVE (ref <300)

## 2024-01-14 LAB — CBC WITH AUTO DIFFERENTIAL
Basophils %: 0.5 % (ref 0.0–1.0)
Basophils Absolute: 0 K/uL (ref 0.00–0.20)
Eosinophils %: 1.1 % (ref 0.0–5.0)
Eosinophils Absolute: 0.1 K/uL (ref 0.00–0.60)
Hematocrit: 40.5 % — ABNORMAL LOW (ref 42.0–52.0)
Hemoglobin: 13.9 g/dL — ABNORMAL LOW (ref 14.0–18.0)
Immature Granulocytes #: 0 K/uL
Lymphocytes %: 18.7 % — ABNORMAL LOW (ref 20.0–40.0)
Lymphocytes Absolute: 1.4 K/uL (ref 1.1–4.5)
MCH: 31 pg (ref 27.0–31.0)
MCHC: 34.3 g/dL (ref 33.0–37.0)
MCV: 90.4 fL (ref 80.0–94.0)
MPV: 9 fL — ABNORMAL LOW (ref 9.4–12.4)
Monocytes %: 7.2 % (ref 0.0–10.0)
Monocytes Absolute: 0.5 K/uL (ref 0.00–0.90)
Neutrophils %: 72.4 % — ABNORMAL HIGH (ref 50.0–65.0)
Neutrophils Absolute: 5.3 K/uL (ref 1.5–7.5)
Platelets: 172 K/uL (ref 130–400)
RBC: 4.48 M/uL — ABNORMAL LOW (ref 4.70–6.10)
RDW: 13.1 % (ref 11.5–14.5)
WBC: 7.3 K/uL (ref 4.8–10.8)

## 2024-01-14 LAB — COMPREHENSIVE METABOLIC PANEL
ALT: 55 U/L — ABNORMAL HIGH (ref 10–50)
AST: 26 U/L (ref 10–50)
Albumin: 4.1 g/dL (ref 3.5–5.2)
Alkaline Phosphatase: 77 U/L (ref 40–129)
Anion Gap: 8 mmol/L (ref 8–16)
BUN: 18 mg/dL (ref 8–23)
CO2: 28 mmol/L (ref 22–29)
Calcium: 9.2 mg/dL (ref 8.8–10.2)
Chloride: 107 mmol/L (ref 98–107)
Creatinine: 1 mg/dL (ref 0.7–1.2)
Est, Glom Filt Rate: 83 (ref >60)
Glucose: 170 mg/dL — ABNORMAL HIGH (ref 70–99)
Potassium: 4 mmol/L (ref 3.5–5.1)
Sodium: 143 mmol/L (ref 136–145)
Total Bilirubin: 0.3 mg/dL (ref 0.2–1.2)
Total Protein: 6.6 g/dL (ref 6.4–8.3)

## 2024-01-14 LAB — ETHANOL: Ethanol Lvl: <11 mg/dL (ref 0–11)

## 2024-01-14 LAB — BASIC METABOLIC PANEL W/ REFLEX TO MG FOR LOW K
Anion Gap: 10 mmol/L (ref 8–16)
BUN: 16 mg/dL (ref 8–23)
CO2: 26 mmol/L (ref 22–29)
Calcium: 9.7 mg/dL (ref 8.8–10.2)
Chloride: 108 mmol/L — ABNORMAL HIGH (ref 98–107)
Creatinine: 0.9 mg/dL (ref 0.7–1.2)
Est, Glom Filt Rate: >90 (ref >60)
Glucose: 153 mg/dL — ABNORMAL HIGH (ref 70–99)
Potassium reflex Magnesium: 3.8 mmol/L (ref 3.5–5.0)
Sodium: 144 mmol/L (ref 136–145)

## 2024-01-14 LAB — MAGNESIUM: Magnesium: 2.1 mg/dL (ref 1.6–2.4)

## 2024-01-14 LAB — APTT: aPTT: 31.1 s (ref 26.0–36.2)

## 2024-01-14 LAB — VITAMIN D 25 HYDROXY: Vit D, 25-Hydroxy: 54 ng/mL (ref >=30)

## 2024-01-14 LAB — TYPE AND SCREEN
ABO/Rh: O NEG
Antibody Screen: NEGATIVE

## 2024-01-14 MED ORDER — PROMETHAZINE HCL 25 MG/ML IJ SOLN
25 | Freq: Once | INTRAMUSCULAR | Status: AC
Start: 2024-01-14 — End: 2024-01-14
  Administered 2024-01-14: 08:00:00 25 mg via INTRAMUSCULAR

## 2024-01-14 MED ORDER — ATORVASTATIN CALCIUM 10 MG PO TABS
10 | Freq: Every day | ORAL | Status: DC
Start: 2024-01-14 — End: 2024-01-19
  Administered 2024-01-15 – 2024-01-19 (×5): 10 mg via ORAL

## 2024-01-14 MED ORDER — NORMAL SALINE FLUSH 0.9 % IV SOLN
0.9 | Freq: Two times a day (BID) | INTRAVENOUS | Status: DC
Start: 2024-01-14 — End: 2024-01-19
  Administered 2024-01-15 – 2024-01-19 (×9): 10 mL via INTRAVENOUS

## 2024-01-14 MED ORDER — LABETALOL HCL 5 MG/ML IV SOLN
5 | INTRAVENOUS | Status: DC | PRN
Start: 2024-01-14 — End: 2024-01-14

## 2024-01-14 MED ORDER — HYDRALAZINE HCL 20 MG/ML IJ SOLN
20 | INTRAMUSCULAR | Status: DC | PRN
Start: 2024-01-14 — End: 2024-01-14

## 2024-01-14 MED ORDER — NALOXONE HCL 0.4 MG/ML IJ SOLN
0.4 | INTRAMUSCULAR | Status: DC | PRN
Start: 2024-01-14 — End: 2024-01-14

## 2024-01-14 MED ORDER — HYDRALAZINE HCL 20 MG/ML IJ SOLN
20 | INTRAMUSCULAR | Status: DC | PRN
Start: 2024-01-14 — End: 2024-01-19
  Administered 2024-01-15 – 2024-01-19 (×2): 10 mg via INTRAVENOUS

## 2024-01-14 MED ORDER — FENTANYL CITRATE (PF) 250 MCG/5ML IJ SOLN
250 | INTRAMUSCULAR | Status: AC
Start: 2024-01-14 — End: 2024-01-14

## 2024-01-14 MED ORDER — TRANEXAMIC ACID 1000 MG/10ML IV SOLN
1000 | INTRAVENOUS | Status: AC
Start: 2024-01-14 — End: 2024-01-14

## 2024-01-14 MED ORDER — OXYCODONE HCL 5 MG PO TABS
5 | ORAL | Status: DC | PRN
Start: 2024-01-14 — End: 2024-01-19

## 2024-01-14 MED ORDER — POLYETHYLENE GLYCOL 3350 17 G PO PACK
17 | Freq: Two times a day (BID) | ORAL | Status: DC | PRN
Start: 2024-01-14 — End: 2024-01-19
  Administered 2024-01-19: 17:00:00 17 g via ORAL

## 2024-01-14 MED ORDER — ACETAMINOPHEN 500 MG PO TABS
500 | Freq: Three times a day (TID) | ORAL | Status: DC
Start: 2024-01-14 — End: 2024-01-14

## 2024-01-14 MED ORDER — NORMAL SALINE FLUSH 0.9 % IV SOLN
0.9 | INTRAVENOUS | Status: DC | PRN
Start: 2024-01-14 — End: 2024-01-14

## 2024-01-14 MED ORDER — HYDROMORPHONE HCL 1 MG/ML IJ SOLN
1 | INTRAMUSCULAR | Status: AC
Start: 2024-01-14 — End: 2024-01-14

## 2024-01-14 MED ORDER — POTASSIUM CHLORIDE CRYS ER 20 MEQ PO TBCR
20 | ORAL | Status: DC | PRN
Start: 2024-01-14 — End: 2024-01-19

## 2024-01-14 MED ORDER — MELATONIN 5 MG PO TBDP
5 | Freq: Every evening | ORAL | Status: DC | PRN
Start: 2024-01-14 — End: 2024-01-19
  Administered 2024-01-16 – 2024-01-19 (×3): 5 mg via ORAL

## 2024-01-14 MED ORDER — EPHEDRINE SULFATE (PRESSORS) 25 MG/5ML IV SOSY
25 | Freq: Once | INTRAVENOUS | Status: DC | PRN
Start: 2024-01-14 — End: 2024-01-14
  Administered 2024-01-14: 20:00:00 10 via INTRAVENOUS

## 2024-01-14 MED ORDER — MORPHINE SULFATE (PF) 4 MG/ML IJ SOLN
4 | INTRAMUSCULAR | Status: DC | PRN
Start: 2024-01-14 — End: 2024-01-19

## 2024-01-14 MED ORDER — MEMANTINE HCL 5 MG PO TABS
5 | Freq: Two times a day (BID) | ORAL | Status: DC
Start: 2024-01-14 — End: 2024-01-19
  Administered 2024-01-15 – 2024-01-19 (×10): 10 mg via ORAL

## 2024-01-14 MED ORDER — SODIUM CHLORIDE 0.9 % IV SOLN
0.9 | INTRAVENOUS | Status: DC | PRN
Start: 2024-01-14 — End: 2024-01-19

## 2024-01-14 MED ORDER — CEFAZOLIN SODIUM 1 G IJ SOLR
1 | Freq: Once | INTRAMUSCULAR | Status: DC | PRN
Start: 2024-01-14 — End: 2024-01-14
  Administered 2024-01-14: 19:00:00 2 via INTRAVENOUS

## 2024-01-14 MED ORDER — VITAMIN B-6 100 MG PO TABS
100 | Freq: Every day | ORAL | Status: DC
Start: 2024-01-14 — End: 2024-01-19
  Administered 2024-01-15 – 2024-01-19 (×5): 100 mg via ORAL

## 2024-01-14 MED ORDER — LIDOCAINE HCL 1 % IJ SOLN
1 | Freq: Once | INTRAMUSCULAR | Status: DC | PRN
Start: 2024-01-14 — End: 2024-01-14
  Administered 2024-01-14: 19:00:00 50 via INTRAVENOUS

## 2024-01-14 MED ORDER — NORMAL SALINE FLUSH 0.9 % IV SOLN
0.9 | INTRAVENOUS | Status: DC | PRN
Start: 2024-01-14 — End: 2024-01-19

## 2024-01-14 MED ORDER — MORPHINE SULFATE (PF) 2 MG/ML IV SOLN
2 | INTRAVENOUS | Status: DC | PRN
Start: 2024-01-14 — End: 2024-01-19
  Administered 2024-01-16: 06:00:00 2 mg via INTRAVENOUS

## 2024-01-14 MED ORDER — POTASSIUM BICARB-CITRIC ACID 20 MEQ PO TBEF
20 | ORAL | Status: DC | PRN
Start: 2024-01-14 — End: 2024-01-19

## 2024-01-14 MED ORDER — MAGNESIUM SULFATE 2000 MG/50 ML IVPB PREMIX
2 | INTRAVENOUS | Status: DC | PRN
Start: 2024-01-14 — End: 2024-01-19

## 2024-01-14 MED ORDER — PHENYLEPHRINE HCL 10 MG/ML SOLN (MIXTURES ONLY)
10 | Freq: Once | Status: DC | PRN
Start: 2024-01-14 — End: 2024-01-14
  Administered 2024-01-14: 20:00:00 100 via INTRAVENOUS
  Administered 2024-01-14: 20:00:00 200 via INTRAVENOUS

## 2024-01-14 MED ORDER — ASPIRIN 81 MG PO CHEW
81 | Freq: Every day | ORAL | Status: DC
Start: 2024-01-14 — End: 2024-01-14

## 2024-01-14 MED ORDER — PROMETHAZINE HCL 12.5 MG PO TABS
12.5 | Freq: Four times a day (QID) | ORAL | Status: DC | PRN
Start: 2024-01-14 — End: 2024-01-19
  Administered 2024-01-17: 02:00:00 12.5 mg via ORAL

## 2024-01-14 MED ORDER — CEFAZOLIN SODIUM 1 G IJ SOLR
1 | Freq: Three times a day (TID) | INTRAMUSCULAR | Status: AC
Start: 2024-01-14 — End: 2024-01-16
  Administered 2024-01-15 – 2024-01-16 (×5): 2000 mg via INTRAVENOUS

## 2024-01-14 MED ORDER — ROPIVACAINE HCL 2 MG/ML IJ SOLN
INTRAMUSCULAR | Status: DC | PRN
Start: 2024-01-14 — End: 2024-01-14
  Administered 2024-01-14: 20:00:00 40

## 2024-01-14 MED ORDER — VITAMIN B-12 500 MCG PO TABS
500 | Freq: Every day | ORAL | Status: DC
Start: 2024-01-14 — End: 2024-01-19
  Administered 2024-01-15 – 2024-01-19 (×5): 2000 ug via ORAL

## 2024-01-14 MED ORDER — SUGAMMADEX SODIUM 500 MG/5ML IV SOLN
500 | Freq: Once | INTRAVENOUS | Status: DC | PRN
Start: 2024-01-14 — End: 2024-01-14
  Administered 2024-01-14: 21:00:00 200 via INTRAVENOUS

## 2024-01-14 MED ORDER — SODIUM CHLORIDE 0.9 % IV SOLN
0.9 | INTRAVENOUS | Status: DC | PRN
Start: 2024-01-14 — End: 2024-01-14

## 2024-01-14 MED ORDER — OXYCODONE HCL 5 MG PO TABS
5 | ORAL | Status: DC | PRN
Start: 2024-01-14 — End: 2024-01-19
  Administered 2024-01-16 – 2024-01-18 (×3): 5 mg via ORAL

## 2024-01-14 MED ORDER — HYDROMORPHONE HCL 1 MG/ML IJ SOLN
1 | Freq: Once | INTRAMUSCULAR | Status: DC | PRN
Start: 2024-01-14 — End: 2024-01-14
  Administered 2024-01-14: 21:00:00 1 via INTRAVENOUS

## 2024-01-14 MED ORDER — MELOXICAM 7.5 MG PO TABS
7.5 | Freq: Every day | ORAL | Status: AC
Start: 2024-01-14 — End: 2024-01-16
  Administered 2024-01-15 – 2024-01-16 (×3): 3.75 mg via ORAL

## 2024-01-14 MED ORDER — ZINC SULFATE 220 (50 ZN) MG PO CAPS
220 | Freq: Every day | ORAL | Status: DC
Start: 2024-01-14 — End: 2024-01-19
  Administered 2024-01-15 – 2024-01-19 (×5): 50 mg via ORAL

## 2024-01-14 MED ORDER — CALCIUM CARBONATE ANTACID 500 MG PO CHEW
500 | Freq: Three times a day (TID) | ORAL | Status: DC | PRN
Start: 2024-01-14 — End: 2024-01-19

## 2024-01-14 MED ORDER — DIPHENHYDRAMINE HCL 50 MG/ML IJ SOLN
50 | Freq: Once | INTRAMUSCULAR | Status: DC | PRN
Start: 2024-01-14 — End: 2024-01-14

## 2024-01-14 MED ORDER — THERAPEUTIC MULTIVIT/MINERAL PO TABS
Freq: Every day | ORAL | Status: DC
Start: 2024-01-14 — End: 2024-01-19
  Administered 2024-01-15 – 2024-01-19 (×5): 1 via ORAL

## 2024-01-14 MED ORDER — PROPOFOL 200 MG/20ML IV EMUL
200 | Freq: Once | INTRAVENOUS | Status: DC | PRN
Start: 2024-01-14 — End: 2024-01-14
  Administered 2024-01-14 (×2): 50 via INTRAVENOUS

## 2024-01-14 MED ORDER — NORMAL SALINE FLUSH 0.9 % IV SOLN
0.9 | Freq: Two times a day (BID) | INTRAVENOUS | Status: DC
Start: 2024-01-14 — End: 2024-01-14

## 2024-01-14 MED ORDER — NORMAL SALINE FLUSH 0.9 % IV SOLN
0.9 | Freq: Two times a day (BID) | INTRAVENOUS | Status: DC
Start: 2024-01-14 — End: 2024-01-19
  Administered 2024-01-14 – 2024-01-19 (×9): 10 mL via INTRAVENOUS

## 2024-01-14 MED ORDER — METAXALONE 800 MG PO TABS
800 | Freq: Three times a day (TID) | ORAL | Status: DC | PRN
Start: 2024-01-14 — End: 2024-01-19
  Administered 2024-01-19: 01:00:00 800 mg via ORAL

## 2024-01-14 MED ORDER — PROMETHAZINE HCL 25 MG/ML IJ SOLN
25 | Freq: Four times a day (QID) | INTRAMUSCULAR | Status: DC | PRN
Start: 2024-01-14 — End: 2024-01-19

## 2024-01-14 MED ORDER — ACETAMINOPHEN 325 MG PO TABS
325 | Freq: Four times a day (QID) | ORAL | Status: DC
Start: 2024-01-14 — End: 2024-01-19
  Administered 2024-01-15 – 2024-01-19 (×14): 650 mg via ORAL

## 2024-01-14 MED ORDER — MORPHINE SULFATE (PF) 2 MG/ML IV SOLN
2 | INTRAVENOUS | Status: DC | PRN
Start: 2024-01-14 — End: 2024-01-19
  Administered 2024-01-14 – 2024-01-19 (×4): 2 mg via INTRAVENOUS

## 2024-01-14 MED ORDER — POLYMYXIN B SULFATE 500000 UNITS IJ SOLR
500000 | INTRAMUSCULAR | Status: DC | PRN
Start: 2024-01-14 — End: 2024-01-14
  Administered 2024-01-14: 20:00:00 3000

## 2024-01-14 MED ORDER — ONDANSETRON HCL 4 MG/2ML IJ SOLN
4 | Freq: Four times a day (QID) | INTRAMUSCULAR | Status: DC | PRN
Start: 2024-01-14 — End: 2024-01-19

## 2024-01-14 MED ORDER — TRANEXAMIC ACID 1000 MG/10ML IV SOLN
1000 | Freq: Once | INTRAVENOUS | Status: DC | PRN
Start: 2024-01-14 — End: 2024-01-14
  Administered 2024-01-14 (×2): 1000 via INTRAVENOUS

## 2024-01-14 MED ORDER — HYDROMORPHONE HCL PF 1 MG/ML IJ SOLN
1 | INTRAMUSCULAR | Status: DC | PRN
Start: 2024-01-14 — End: 2024-01-14

## 2024-01-14 MED ORDER — VITAMIN E 180 MG (400 UNIT) PO CAPS
180 | Freq: Every day | ORAL | Status: DC
Start: 2024-01-14 — End: 2024-01-19
  Administered 2024-01-15 – 2024-01-19 (×5): 800 [IU] via ORAL

## 2024-01-14 MED ORDER — OXYCODONE HCL 5 MG PO TABS
5 | ORAL | Status: DC | PRN
Start: 2024-01-14 — End: 2024-01-14

## 2024-01-14 MED ORDER — ASPIRIN 325 MG PO TBEC
325 | Freq: Two times a day (BID) | ORAL | Status: DC
Start: 2024-01-14 — End: 2024-01-19
  Administered 2024-01-15 – 2024-01-19 (×10): 325 mg via ORAL

## 2024-01-14 MED ORDER — IPRATROPIUM-ALBUTEROL 0.5-2.5 (3) MG/3ML IN SOLN
0.5-2.5 | Freq: Once | RESPIRATORY_TRACT | Status: DC | PRN
Start: 2024-01-14 — End: 2024-01-14

## 2024-01-14 MED ORDER — DISULFIRAM 250 MG PO TABS
250 | Freq: Every day | ORAL | Status: DC
Start: 2024-01-14 — End: 2024-01-19
  Administered 2024-01-15 – 2024-01-19 (×5): 500 mg via ORAL

## 2024-01-14 MED ORDER — SODIUM PHOSPHATE 3 MMOL/ML IV SOLN (MIXTURES ONLY)
3 | INTRAVENOUS | Status: DC | PRN
Start: 2024-01-14 — End: 2024-01-19

## 2024-01-14 MED ORDER — ROCURONIUM BROMIDE 100 MG/10ML IV SOLN
100 | Freq: Once | INTRAVENOUS | Status: DC | PRN
Start: 2024-01-14 — End: 2024-01-14
  Administered 2024-01-14: 19:00:00 50 via INTRAVENOUS

## 2024-01-14 MED ORDER — PROCHLORPERAZINE EDISYLATE 10 MG/2ML IJ SOLN
10 | Freq: Once | INTRAMUSCULAR | Status: DC | PRN
Start: 2024-01-14 — End: 2024-01-14

## 2024-01-14 MED ORDER — CEFAZOLIN SODIUM 1 G IJ SOLR
1 | INTRAMUSCULAR | Status: AC
Start: 2024-01-14 — End: 2024-01-14

## 2024-01-14 MED ORDER — OXYCODONE HCL 10 MG PO TABS
10 | ORAL | Status: DC | PRN
Start: 2024-01-14 — End: 2024-01-19
  Administered 2024-01-19: 01:00:00 10 mg via ORAL

## 2024-01-14 MED ORDER — FENTANYL CITRATE (PF) 100 MCG/2ML IJ SOLN
100 | Freq: Once | INTRAMUSCULAR | Status: DC | PRN
Start: 2024-01-14 — End: 2024-01-14
  Administered 2024-01-14 (×2): 50 via INTRAVENOUS
  Administered 2024-01-14: 20:00:00 100 via INTRAVENOUS
  Administered 2024-01-14: 19:00:00 50 via INTRAVENOUS

## 2024-01-14 MED ORDER — ONDANSETRON HCL 4 MG/2ML IJ SOLN
4 | Freq: Once | INTRAMUSCULAR | Status: DC | PRN
Start: 2024-01-14 — End: 2024-01-14
  Administered 2024-01-14: 21:00:00 4 via INTRAVENOUS

## 2024-01-14 MED ORDER — POTASSIUM CHLORIDE 10 MEQ/100ML IV SOLN
10 | INTRAVENOUS | Status: DC | PRN
Start: 2024-01-14 — End: 2024-01-19

## 2024-01-14 MED ORDER — ROPIVACAINE HCL 2 MG/ML IJ SOLN
INTRAMUSCULAR | Status: AC
Start: 2024-01-14 — End: 2024-01-14

## 2024-01-14 MED ORDER — CHOLECALCIFEROL 125 MCG (5000 UT) PO CAPS
125 | Freq: Every day | ORAL | Status: DC
Start: 2024-01-14 — End: 2024-01-19
  Administered 2024-01-15 – 2024-01-19 (×5): 5000 [IU] via ORAL

## 2024-01-14 MED ORDER — METHOCARBAMOL 1000 MG/10ML IJ SOLN
1000 | INTRAMUSCULAR | Status: AC
Start: 2024-01-14 — End: 2024-01-14

## 2024-01-14 MED ORDER — MAGNESIUM GLUCONATE 500 (27 MG) MG PO TABS
500 | Freq: Every day | ORAL | Status: DC
Start: 2024-01-14 — End: 2024-01-19
  Administered 2024-01-15 – 2024-01-19 (×5): 500 mg via ORAL

## 2024-01-14 MED ORDER — POLYMYXIN B SULFATE 500000 UNITS IJ SOLR
500000 | INTRAMUSCULAR | Status: AC
Start: 2024-01-14 — End: 2024-01-14

## 2024-01-14 MED ORDER — NALOXONE HCL 0.4 MG/ML IJ SOLN
0.4 | INTRAMUSCULAR | Status: DC | PRN
Start: 2024-01-14 — End: 2024-01-19

## 2024-01-14 MED ORDER — LACTATED RINGERS IV SOLN
INTRAVENOUS | Status: DC | PRN
Start: 2024-01-14 — End: 2024-01-14
  Administered 2024-01-14: 19:00:00 via INTRAVENOUS

## 2024-01-14 MED ORDER — MORPHINE SULFATE (PF) 2 MG/ML IV SOLN
2 | INTRAVENOUS | Status: DC | PRN
Start: 2024-01-14 — End: 2024-01-19

## 2024-01-14 MED ORDER — OXYCODONE HCL 10 MG PO TABS
10 | ORAL | Status: DC | PRN
Start: 2024-01-14 — End: 2024-01-14

## 2024-01-14 MED ORDER — DONEPEZIL HCL 10 MG PO TABS
10 | Freq: Every evening | ORAL | Status: DC
Start: 2024-01-14 — End: 2024-01-19
  Administered 2024-01-15 – 2024-01-19 (×5): 10 mg via ORAL

## 2024-01-14 MED FILL — OXYCODONE HCL 10 MG PO TABS: 10 mg | ORAL | Qty: 1

## 2024-01-14 MED FILL — ROBAXIN 1000 MG/10ML IJ SOLN: 1000 MG/10ML | INTRAMUSCULAR | Qty: 10

## 2024-01-14 MED FILL — PROMETHAZINE HCL 12.5 MG PO TABS: 12.5 mg | ORAL | Qty: 1

## 2024-01-14 MED FILL — FENTANYL CITRATE (PF) 250 MCG/5ML IJ SOLN: 250 MCG/5ML | INTRAMUSCULAR | Qty: 5

## 2024-01-14 MED FILL — PROMETHAZINE HCL 25 MG/ML IJ SOLN: 25 mg/mL | INTRAMUSCULAR | Qty: 0.5

## 2024-01-14 MED FILL — DILAUDID 1 MG/ML IJ SOLN: 1 mg/mL | INTRAMUSCULAR | Qty: 1

## 2024-01-14 MED FILL — CEFAZOLIN SODIUM 1 G IJ SOLR: 1 g | INTRAMUSCULAR | Qty: 1000

## 2024-01-14 MED FILL — MORPHINE SULFATE 2 MG/ML IJ SOLN: 2 mg/mL | INTRAMUSCULAR | Qty: 1

## 2024-01-14 MED FILL — TRANEXAMIC ACID 1000 MG/10ML IV SOLN: 1000 MG/10ML | INTRAVENOUS | Qty: 20

## 2024-01-14 MED FILL — NAROPIN 2 MG/ML IJ SOLN: 2 mg/mL | INTRAMUSCULAR | Qty: 40

## 2024-01-14 MED FILL — POLYMYXIN B SULFATE 500000 UNITS IJ SOLR: 500000 [IU] | INTRAMUSCULAR | Qty: 500000

## 2024-01-14 NOTE — Anesthesia Pre-Procedure Evaluation (Addendum)
 Department of Anesthesiology  Preprocedure Note       Name:  Raymond Skinner   Age:  68 y.o.  DOB:  [birth year]                                          MRN:  [id number]         Date:  01/14/2024      Surgeon: Clotilde):  DeWeese, Gilmore DASEN, MD    Procedure: Procedure(s):  HIP TOTAL ARTHROPLASTY    Medications prior to admission:   Prior to Admission medications   Medication Sig Start Date End Date Taking? Authorizing Provider   memantine  (NAMENDA ) 10 MG tablet Take 1 tablet by mouth 2 times daily 01/08/24   Joshua Dorothyann SAILOR, APRN   donepezil  (ARICEPT ) 10 MG tablet Take 1 tablet by mouth nightly 01/08/24   Joshua Dorothyann SAILOR, APRN   simvastatin  (ZOCOR ) 20 MG tablet Take 1 tablet by mouth nightly 01/08/24   Joshua Dorothyann SAILOR, APRN   Disulfiram  500 MG TABS Take 1 tablet by mouth daily 01/08/24   Joshua Dorothyann SAILOR, APRN   Multiple Vitamins-Minerals (THERAPEUTIC MULTIVITAMIN-MINERALS) tablet Take 1 tablet by mouth daily    [provider]   aspirin 81 MG chewable tablet Take 1 tablet by mouth daily    [provider]   vitamin B-6 (PYRIDOXINE) 100 MG tablet Take 1 tablet by mouth daily    [provider]   Vitamin D-Vitamin K (VITAMIN K2-VITAMIN D3 PO) Take 90 mcg by mouth daily    [provider]   vitamin E 1000 units capsule Take 1 capsule by mouth daily    [provider]   cyanocobalamin 1000 MCG tablet Take 2.5 tablets by mouth daily    [provider]   magnesium 30 MG tablet Take 250 mg by mouth daily    [provider]   Potassium 99 MG TABS Take by mouth    [provider]   zinc gluconate 50 MG tablet Take 1 tablet by mouth daily    [provider]   Ginkgo 60 MG TABS Take by mouth    [provider]       Current medications:    Current Facility-Administered Medications   Medication Dose Route Frequency Provider Last Rate Last Admin   . [Transfer Hold] hydrALAZINE (APRESOLINE) injection 10 mg  10 mg IntraVENous Q4H PRN  Claudean Charleston, MD       . Una Hold] morphine (PF) injection 1 mg  1 mg IntraVENous Q4H PRN Claudean Charleston, MD       . Una Hold] morphine (PF) injection 2 mg  2 mg IntraVENous Q4H PRN Claudean Charleston, MD   2 mg at 01/14/24 1138   . [Transfer Hold] morphine sulfate (PF) injection 4 mg  4 mg IntraVENous Q4H PRN Claudean Charleston, MD       . Una Hold] oxyCODONE (ROXICODONE) immediate release tablet 2.5 mg  2.5 mg Oral Q4H PRN Claudean Charleston, MD       . Una Hold] oxyCODONE (ROXICODONE) immediate release tablet 5 mg  5 mg Oral Q4H PRN Claudean Charleston, MD       . Una Hold] oxyCODONE HCl (OXY-IR) immediate release tablet 10 mg  10 mg Oral Q4H PRN Claudean Charleston, MD       . Una Hold] naloxone (NARCAN) injection 0.4 mg  0.4 mg IntraVENous PRN Claudean Charleston, MD       . Una Hold] acetaminophen (TYLENOL) tablet 1,000 mg  1,000 mg Oral q8h Claudean Charleston, MD       . Una Hold] ondansetron (ZOFRAN) injection 4 mg  4 mg IntraVENous Q6H PRN Claudean Charleston, MD       . Una Hold] promethazine (PHENERGAN) injection 12.5 mg  12.5 mg IntraMUSCular Q6H PRN Claudean Charleston, MD       . Una Hold] promethazine (PHENERGAN) tablet 12.5 mg  12.5 mg Oral Q6H PRN Claudean Charleston, MD       . Una Hold] aspirin chewable tablet 81 mg  81 mg Oral Daily Claudean Charleston, MD       . Una Hold] vitamin B-12 (CYANOCOBALAMIN) tablet 2,000 mcg  2,000 mcg Oral Daily Claudean Charleston, MD       . Una Hold] disulfiram  (ANTABUSE ) tablet 500 mg  500 mg Oral Daily Claudean Charleston, MD       . Una Hold] donepezil  (ARICEPT ) tablet 10 mg  10 mg Oral Nightly Claudean Charleston, MD       . Una Hold] Magnesium Gluconate tablet 500 mg  500 mg Oral Daily Claudean Charleston, MD       . Una Hold] memantine  (NAMENDA ) tablet 10 mg  10 mg Oral BID Claudean Charleston, MD       . Una Hold] therapeutic multivitamin-minerals 1 tablet  1 tablet Oral Daily Claudean Charleston, MD       . Una Hold]  atorvastatin (LIPITOR) tablet 10 mg  10 mg Oral Daily Claudean Charleston, MD       . Una Hold] vitamin B-6 (PYRIDOXINE) tablet 100 mg  100 mg Oral Daily Claudean Charleston, MD       . Una Hold] vitamin D (CHOLECALCIFEROL) capsule 5,000 Units  5,000 Units Oral Daily Claudean Charleston, MD       . Una Hold] vitamin E capsule 800 Units  800 Units Oral Daily Claudean Charleston, MD       . Una Hold] zinc sulfate (ZINCATE) 220 mg capsule - elemental zinc 50 mg  50 mg Oral Daily Claudean Charleston, MD       . Una Hold] sodium chloride flush 0.9 % injection 5-40 mL  5-40 mL IntraVENous 2 times per day Claudean Charleston, MD   10 mL at 01/14/24 1139   . [Transfer Hold] sodium chloride flush 0.9 % injection 5-40 mL  5-40 mL IntraVENous PRN Claudean Charleston, MD       . Una Hold] 0.9 % sodium chloride infusion   IntraVENous PRN Claudean Charleston, MD       . Una Hold] potassium chloride (KLOR-CON M) extended release tablet 40 mEq  40 mEq Oral PRN Claudean Charleston, MD        Or   . Una Hold] potassium bicarb-citric acid (EFFER-K) effervescent tablet 40 mEq  40 mEq Oral PRN Claudean Charleston, MD        Or   . Una Hold] potassium chloride 10 mEq/100 mL IVPB (Peripheral Line)  10 mEq IntraVENous PRN Claudean Charleston, MD       . Una Hold] magnesium sulfate 2000 mg in 50 mL IVPB premix  2,000 mg IntraVENous PRN Claudean Charleston, MD       . Una Hold] sodium phosphate 13.26 mmol in sodium chloride 0.9 % 250 mL IVPB  0.16 mmol/kg IntraVENous PRN Claudean Charleston, MD        Or   . Una Hold]  sodium phosphate 26.49 mmol in sodium chloride 0.9 % 250 mL IVPB  0.32 mmol/kg IntraVENous PRN Claudean Charleston, MD       . Una Hold] metaxalone [facility]) tablet 800 mg  800 mg Oral Q8H PRN Claudean Charleston, MD       . Una Hold] polyethylene glycol (GLYCOLAX) packet 17 g  17 g Oral BID PRN Claudean Charleston, MD       . Una Hold] melatonin disintegrating tablet 5 mg  5 mg Oral Nightly PRN Claudean Charleston, MD        . Una Hold] calcium carbonate (TUMS) chewable tablet 1,000 mg  1,000 mg Oral TID PRN Claudean Charleston, MD           Allergies:  No Known Allergies    Problem List:    Patient Active Problem List   Diagnosis Code   . Recurrent falls R29.6   . Dementia (HCC) F03.90   . Closed fracture of right femur, initial encounter (HCC) S72.91XA   . Delirium superimposed on dementia F05   . Closed subcapital fracture of neck of right femur (HCC) S72.011A       Past Medical History:        Diagnosis Date   . Dementia (HCC)    . 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 No pertinent surgical history.    Social History:    Social History     Tobacco Use   . Smoking status: Former     Current packs/day: 0.00     Average packs/day: 0.5 packs/day for 42.4 years (21.2 ttl pk-yrs)     Types: Cigarettes     Start date: 12/31/1974     Quit date: 2019     Years since quitting: 6.6   . Smokeless tobacco: Never   Substance Use Topics   . Alcohol use: Never                                Counseling given: Not Answered      Vital Signs (Current):   Vitals:    01/14/24 1030 01/14/24 1445 01/14/24 1446 01/14/24 1451   BP:   (!) 162/94 (!) 170/85   Pulse:   66 64   Resp:  18 16 17    Temp:       TempSrc:       SpO2:   (!) 86% 91%   Weight: 82.6 kg (182 lb 1.6 oz)      Height: 1.854 m (6' 1)                                                 BP Readings from Last 3 Encounters:   01/14/24 (!) 170/85   01/08/24 138/82   12/17/23 (!) 167/91       NPO Status: Time of last liquid consumption: 2100                        Time of last solid consumption: 1800                        Date of last liquid consumption: 01/13/24  Date of last solid food consumption: 01/13/24    BMI:   Wt Readings from Last 3 Encounters:   01/14/24 82.6 kg (182 lb 1.6 oz)   01/08/24 82.8 kg (182 lb 8 oz)   12/17/23 85.3 kg (188 lb)     Body mass index is 24.03 kg/m.    CBC:   Lab Results   Component Value  Date/Time    WBC 7.3 01/13/2024 11:56 PM    RBC 4.48 01/13/2024 11:56 PM    HGB 13.9 01/13/2024 11:56 PM    HCT 40.5 01/13/2024 11:56 PM    MCV 90.4 01/13/2024 11:56 PM    RDW 13.1 01/13/2024 11:56 PM    PLT 172 01/13/2024 11:56 PM       CMP:   Lab Results   Component Value Date/Time    NA 144 01/14/2024 04:15 AM    K 3.8 01/14/2024 04:15 AM    K 4.0 01/13/2024 11:56 PM    CL 108 01/14/2024 04:15 AM    CO2 26 01/14/2024 04:15 AM    BUN 16 01/14/2024 04:15 AM    CREATININE 0.9 01/14/2024 04:15 AM    LABGLOM >90 01/14/2024 04:15 AM    GLUCOSE 153 01/14/2024 04:15 AM    CALCIUM 9.7 01/14/2024 04:15 AM    BILITOT 0.3 01/13/2024 11:56 PM    ALKPHOS 77 01/13/2024 11:56 PM    AST 26 01/13/2024 11:56 PM    ALT 55 01/13/2024 11:56 PM       POC Tests: No results for input(s): POCGLU, POCNA, POCK, POCCL, POCBUN, POCHEMO, POCHCT in the last 72 hours.    Coags:   Lab Results   Component Value Date/Time    PROTIME 14.0 01/13/2024 11:56 PM    INR 1.09 01/13/2024 11:56 PM    APTT 31.1 01/13/2024 11:56 PM       HCG (If Applicable): No results found for: PREGTESTUR, PREGSERUM, HCG, HCGQUANT     ABGs: No results found for: PHART, PO2ART, PCO2ART, HCO3ART, BEART, O2SATART     Type & Screen (If Applicable):  Lab Results   Component Value Date    ABORH O NEG 01/13/2024    LABANTI NEG 01/13/2024       Drug/Infectious Status (If Applicable):  No results found for: HIV, HEPCAB    COVID-19 Screening (If Applicable): No results found for: COVID19        Anesthesia Evaluation  Patient summary reviewed   no history of anesthetic complications:   Airway: Mallampati: Unable to assess / NA       Comment: Would not cooperate with airway exam     Dental:          Pulmonary: breath sounds clear to auscultation      (-) COPD, asthma, sleep apnea and not a current smoker                           Cardiovascular:    (+) hyperlipidemia    (-) pacemaker, hypertension, past MI, CAD and dysrhythmias    ECG  reviewed  Rhythm: regular  Rate: normal                    Neuro/Psych:   (+) dementia   (-) seizures and CVA           GI/Hepatic/Renal:        (-) GERD, liver disease and no renal disease       Endo/Other:        (-)  diabetes mellitus, hypothyroidism, hyperthyroidism               Abdominal:             Vascular: negative vascular ROS.         Other Findings:             Anesthesia Plan      general     ASA 2     (NPO - confirmed with floor RN)  Induction: intravenous.    MIPS: Postoperative opioids intended and Prophylactic antiemetics administered.  Anesthetic plan and risks discussed with sibling.    Use of blood products discussed with sibling whom consented to blood products.      Attending anesthesiologist reviewed and agrees with Preprocedure content                Izetta Gowda, MD   01/14/2024

## 2024-01-14 NOTE — ED Notes (Signed)
 ED TO INPATIENT SBAR HANDOFF    Patient Name: Raymond Skinner   DOB: [birth year]  68 y.o.   Family/Caregiver Present: No  Code Status Order: No Order    C-SSRS: Risk of Suicide: No Risk  Sitter No  Restraints:         Situation  Chief Complaint:   Chief Complaint   Patient presents with    Altered Mental Status     Pt family reports that pt has had increased AMS. Pt has hx of Dementia, family at home cannot take care of patient looking for placement.      Patient Diagnosis: Closed fracture of right femur, initial encounter (HCC) [S72.91XA]     Brief Description of Patient's Condition: Raymond Skinner is a 68 y.o. male who presents to the emergency department for evaluation after having several falls last night and today, associated with increased confusion and memory impairment.  Has dementia and has had significant decline recently according to family member.  Most history provided by sister-in-law who accompanies him.  Has not had any recent symptoms of illness or infection.  She says he has been talking out of his head all day today.  He had a couple falls in the shower last night and then fell again today.  Family was unable to get him up.  They report he complained of hip pain earlier but denies any pain currently.  Denies any neck or back pain.  Unsure if he hit his head last night.  Family has already been in contact with [facility] nursing and rehab for possible placement there.  Patient is set to be started on Medicare on September 1.  After discussion with the facility and insurance providers, they were advised patient would require 3 midnights in the hospital prior to placement     Mental Status: disoriented and alert  Arrived from: home    Imaging:   CT PELVIS WO CONTRAST Additional Contrast? None   Final Result   Right femoral neck fracture        All CT scans are performed using dose optimization techniques as appropriate to the performed exam and include    at least one of the following: Automated  exposure control, adjustment of the mA and/or kV according to size, and the use of iterative reconstruction technique.        ______________________________________    Electronically signed by: KANDI BEND M.D.   Date:     01/14/2024   Time:    01:34       XR CHEST PORTABLE   Final Result       1. No acute findings.                       ______________________________________    Electronically signed by: GUSTAVO HIGHTOWER M.D.   Date:     01/14/2024   Time:    01:22       XR HIP 2-3 VW W PELVIS RIGHT   Final Result       Subcapital femoral neck fracture with lateral impaction.               ______________________________________    Electronically signed by: GUSTAVO HIGHTOWER M.D.   Date:     01/14/2024   Time:    01:22       CT HEAD WO CONTRAST   Final Result   1. No acute intracranial process.   2. Stable atrophy and chronic small vessel  ischemic disease        All CT scans are performed using dose optimization techniques as appropriate to the performed exam and include    at least one of the following: Automated exposure control, adjustment of the mA and/or kV according to size, and the use of iterative reconstruction technique.        ______________________________________    Electronically signed by: KANDI BEND M.D.   Date:     01/14/2024   Time:    00:37         COVID-19 Results:   Internal Administration   First Dose      Second Dose           Last COVID Lab No results found for: SARS-COV-2        Abnormal labs:   Abnormal Labs Reviewed   CBC WITH AUTO DIFFERENTIAL - Abnormal; Notable for the following components:       Result Value    RBC 4.48 (*)     Hemoglobin 13.9 (*)     Hematocrit 40.5 (*)     MPV 9.0 (*)     Neutrophils % 72.4 (*)     Lymphocytes % 18.7 (*)     All other components within normal limits   COMPREHENSIVE METABOLIC PANEL - Abnormal; Notable for the following components:    Glucose 170 (*)     ALT 55 (*)     All other components within normal limits   URINALYSIS WITH REFLEX TO CULTURE -  Abnormal; Notable for the following components:    Glucose, Ur 100 (*)     Ketones, Urine TRACE (*)     Protein, UA TRACE (*)     All other components within normal limits     Background  Allergies: No Known Allergies  Current Medications:     History:   Past Medical History:   Diagnosis Date    Dementia (HCC)     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       Assessment  Vitals: Level of Consciousness: Alert (0)   Vitals:    01/13/24 2341 01/13/24 2345 01/14/24 0200 01/14/24 0251   BP: 104/88   (!) 124/90   Pulse: 70   66   Resp: 20   20   Temp:  98.6 F (37 C)     TempSrc:  Temporal     SpO2: 95%  100% 100%     Predictive Model Details          28 (Normal)  Factor Value    Calculated 01/14/2024 02:54 3% Age 57 years old    Deterioration Index Model 24% Glasgow coma scale 14     20% Respiratory rate 20     8% Sodium 143 mmol/L     5% Pulse oximetry 100 %     3% Systolic 124     1% Hematocrit abnormal (40.5 %)     1% Pulse 66     0% Potassium 4.0 mmol/L     0% Temperature 98.6 F (37 C)     0% WBC count 7.3 K/uL       NPO? No  O2 Flow Rate: O2 Device: None (Room air)    Cardiac Rhythm: sinus  NIH Score: NIH     Active LDA's:   Peripheral IV 01/14/24 Right Forearm (Active)   Site Assessment Clean, dry & intact 01/14/24 0004   Line Status Sluggish blood return 01/14/24 0004  Phlebitis Assessment No symptoms 01/14/24 0004   Infiltration Assessment 0 01/14/24 0004   Dressing Status New dressing applied;Clean, dry & intact 01/14/24 0004   Dressing Type Transparent 01/14/24 0004   Dressing Intervention New 01/14/24 0004     Pertinent or High Risk Medications/Drips: no   If Yes, please provide details:   Blood Product Administration: no  If Yes, please provide details:   Sepsis Risk Score      Admitted with Sepsis? No    Recommendation  Incomplete orders:   Patient Belongings: with pt  Additional Comments: Needs SNF placement, has foley  If any further questions, please call Sending RN at [phone]    Electronically signed by:  Electronically signed by Eleanor Hone, RN on 01/14/2024 at 2:54 AM

## 2024-01-14 NOTE — Op Note (Signed)
 TOTAL HIP ARTHROPLASTY OPERATIVE NOTE    NAME OF SURGEON / OPERATOR: PHEBE Deleta Cushing, MD  PATIENT:   Raymond Skinner  Date: 01/14/2024        Time: 4:45 PM   Referring Physician: ________________________    PREOP DIAGNOSIS:  right hip  subcapital femoral neck fracture   POSTOP DIAGNOSIS:  Same     PROCEDURE:    Right    Hip arthroplasty ([id number])     IMPLANTS: * No implants in log *    FINDINGS: None  ASSISTANT:  Lorrene Agent, certified first assistant.  Helped with draping, exposure, retraction, essential steps of the procedure, and with wound closure.   ANESTHESIA:  General  EBL:  500 mL  FLUIDS: See anesthesia record  BLOOD PRODUCTS:  None  COMPLICATIONS:  None  SPECIMEN:  None        INDICATIONS:  Patient presents for the above procedure having failed conservative treatment.  Patient consents to the procedure above understanding the risks of bleeding, infection, anesthesia, nerve injury, stiffness, and blood clots.    Procedure in Detail:    The patient was brought into the operating room, general anesthesia given, and transferred to the HANA table.  The operative extremity was placed in light traction across a padded perineal post.  An antibiotic was given IV. SABRA  The extremity was prepped with chlorhexidine and alcohol and draped sterilely.  Ioband barriers were used.    An anterior approach was made to the hip.  Careful dissection was carried down to fascia which was longitudinally incised.  The tensor muscle was elevated off the medial fascia and a cobra retractor placed around the lateral femoral neck.  Hibbs retractors were used distally between the Sartorious and tensor to expose the vastus aponeurosis.  This was released with the bovie to expose the lateral circumflex vessels. These were coagulated and divided with the bovie.  A cobb elevator was used to lift the rectus off the capsule and a cobra retractor placed around the medial femoral neck.  A bent hohman retractor was placed over the superior  acetabulum.    Traction was applied and the capsule was incised by beginning at the superior acetabulum and extending distally to the intertrochanteric line and then dividing medially and laterally.  The anterior capsule was excised.   The cobra retractors were placed deep to capsule around the neck.  The neck was cut with the saw beginning laterally at the neck-trochanter junction and completed medially 5 mm proximal to the intertrochanteric line.  A second saw cut was made parallel to the first 10 mm proximal to the first cut.  The ring of neck bone was removed with a kocher clamp and the head removed with a corkscrew.  Traction was released and the leg was externally rotated 120 degrees.  A hohman retractor was placed over the lesser trochanter and the medial hip capsule was released off the inferior femoral neck with a bovie and cobb elevator.    Gentle traction was applied and cobra retractors were placed around the acetabulum.  The inferior capsule was released and the labrum and foveal were excised.  The socket was reamed with a reamer 2 sizes smaller than the head diameter and continued with increasing reamer size until good subchondral bone was found.  A maximized view of the pelvis was obtained with the c-arm and the acetabular component was impacted in place at 45 degrees of abduction and 20 degrees of anteversion.  The liner was  snapped in place.      The lift hook for the HANA bed was placed around the proximal femur and the leg was externally rotated 120 degrees and the hip was extended and adducted.  Bent hohman retractors were placed medial to the femoral neck and proximal to the greater trochanter.  The lift hook was raised and the lateral capsule was released off the medial surface of the greater trochanter and lateral neck.    A cookie cutter was used to remove metaphyseal bone.  A rat tail rasp was used to work the lateral bone.  The femoral canal was broached from a size 8 up to a size where the  broach was axially and rotationally stable.  The anteversion of the broach was 10 degrees.  The canal was not reamed.  A calcar planer was used to remove minimal neck bone.  The trial neck and head were placed and the hip was reduced.  C-arm showed that the trial filled the canal well and that the leg lengths were even.  Offset was similar to the opposite hip.    The hip was stable when the leg was externally rotated 90 degrees, the hip extended 20 degrees and an anterior pull applied to the neck.      The hip was dislocated and the trial femoral component removed.  The actual stem was impacted to the same position and orientation as the trial.  The trunion was cleaned and the chosen head was impacted in place.  The hip was reduced.  The hip space was filled with diluted, warm betadine.  C-arm showed good position of the implants and no fractures.    The wound was checked for bleeding and pulse lavaged with antibiotic irrigation.  The capsule was closed with 0 vicryl running suture.  The tissues were injected with 40 cc of bupivacaine.  The fascia was closed with running 0 vicryl, the subcutaneous layer with 2-0 vicryl and the skin closed with running 3-0 vicryl and prineo.  A sterile dressing was placed.  The patient was awakened, extubated and transferred to recovery in stable condition.    ITERATIONS   Neck Length (mm) Offset: Other Stable Ant? Leg Length difference  (mm) Stable Post?   0   Liner due to med ream  perch +3    -4   liner Lip liner at 11 oclock yes =                                        Electronically signed by Gilmore ONEIDA Cushing, MD on 01/14/2024 at 4:45 PM

## 2024-01-14 NOTE — Progress Notes (Shared)
 4 Eyes Skin Assessment     NAME:  NOTNAMED SCHOLZ  DATE OF BIRTH:  13-Nov-1955  MEDICAL RECORD NUMBER:  [id number]    The patient is being assessed for  Admission    I agree that at least one RN has performed a thorough Head to Toe Skin Assessment on the patient. ALL assessment sites listed below have been assessed.      Areas assessed by both nurses:    Head, Face, Ears, Shoulders, Back, Chest, Arms, Elbows, Hands, Sacrum. Buttock, Coccyx, Ischium, and Legs. Feet and Heels        Does the Patient have a Wound? No noted wound(s)       Braden Prevention initiated by RN: No  Wound Care Orders initiated by RN: No    For hospital-acquired stage 1 & 2 and ALL Stage 3,4, Unstageable, DTI, NWPT, and Complex wounds: place order "IP Wound Care/Ostomy Nurse Eval and Treat" by RN under ORDER ENTRY: No    New Ostomies, if present place, Ostomy referral order under ORDER ENTRY: No     Nurse 1 eSignature: Electronically signed by Pura MARLA Colas, RN on 01/14/24 at 10:32 AM CDT    **SHARE this note so that the co-signing nurse can place an eSignature**    Nurse 2 eSignature: {Esignature:[id number]}

## 2024-01-14 NOTE — ED Notes (Signed)
 Notified 5th RN that report note is in on pt

## 2024-01-14 NOTE — Progress Notes (Signed)
 I reviewed my assistant's note done by Lauraine Bunker.  I agree he has a right femoral neck fracture best seen on the CT scan.  He has dementia.  I think he would benefit from hip arthroplasty.  I do not think you will be able to participate in nonweightbearing activity after percutaneous screws.  He is a little bit higher than average risk of dislocation.  But we are doing into an anterior spine approach.  On physical exam his right hip was very irritable whereas left hip passive range of motion was not irritable.  His leg lengths are equal.  His right foot has normal perfusion.

## 2024-01-14 NOTE — Anesthesia Post-Procedure Evaluation (Signed)
 Department of Anesthesiology  Postprocedure Note    Patient: Raymond Skinner  MRN: [id number]  Birthdate: 1955/07/30  Date of evaluation: 01/14/2024    Procedure Summary       Date: 01/14/24 Room / Location: [facility] OR 09 / [facility]    Anesthesia Start: 1511 Anesthesia Stop: 1707    Procedure: HIP TOTAL ARTHROPLASTY (Right) Diagnosis:       Closed subcapital fracture of neck of right femur (HCC)      (Closed subcapital fracture of neck of right femur (HCC) [S72.011A])    Surgeons: Dozier Gilmore DASEN, MD Responsible Provider: Clarise Dannielle BIRCH, APRN - CRNA    Anesthesia Type: general ASA Status: 2            Anesthesia Type: No value filed.    Aldrete Phase I: Aldrete Score: 10    Aldrete Phase II:      Anesthesia Post Evaluation    Patient location during evaluation: PACU  Patient participation: waiting for patient participation  Level of consciousness: awake  Pain score: 0  Airway patency: patent  Nausea & Vomiting: no nausea and no vomiting  Cardiovascular status: hemodynamically stable  Respiratory status: spontaneous ventilation and nonlabored ventilation  Hydration status: stable  Multimodal analgesia pain management approach      No notable events documented.

## 2024-01-14 NOTE — Progress Notes (Signed)
 Pt arrived from ED without anyone accompanying him. Alert, confused, angry that he is  left alone. Continues to ask where his family is. Pt refused to allow 4 nursing staff to turn him to remove linens under him and perform skin assessment. Pt becomes agitated and strikes staff when attempting to assist him to turn or change into gown. Pt unable to answer any admission questions due to confusion. Dr. Claudean notified of pts mental status and was at bedside when patient was refusing care and refused to take PO meds. Pt currently in bed, bed alarm engaged zone 2, door open, fall precautions in place, call light in reach.

## 2024-01-14 NOTE — Progress Notes (Signed)
 Family updated.

## 2024-01-14 NOTE — Plan of Care (Signed)
 Problem: Discharge Planning  Goal: Discharge to home or other facility with appropriate resources  Outcome: Progressing  Flowsheets  Taken 01/14/2024 0930 by Epifanio Bence, RN  Discharge to home or other facility with appropriate resources: Identify barriers to discharge with patient and caregiver  Taken 01/14/2024 0510 by Genene Gowda, RN  Discharge to home or other facility with appropriate resources: Identify barriers to discharge with patient and caregiver     Problem: Pain  Goal: Verbalizes/displays adequate comfort level or baseline comfort level  Outcome: Progressing     Problem: Confusion  Goal: Confusion, delirium, dementia, or psychosis is improved or at baseline  Description: INTERVENTIONS:  1. Assess for possible contributors to thought disturbance, including medications, impaired vision or hearing, underlying metabolic abnormalities, dehydration, psychiatric diagnoses, and notify attending LIP  2. Institute high risk fall precautions, as indicated  3. Provide frequent short contacts to provide reality reorientation, refocusing and direction  4. Decrease environmental stimuli, including noise as appropriate  5. Monitor and intervene to maintain adequate nutrition, hydration, elimination, sleep and activity  6. If unable to ensure safety without constant attention obtain sitter and review sitter guidelines with assigned personnel  7. Initiate Psychosocial CNS and Spiritual Care consult, as indicated  Outcome: Progressing  Flowsheets (Taken 01/14/2024 0510 by Genene Gowda, RN)  Effect of thought disturbance (confusion, delirium, dementia, or psychosis) are managed with adequate functional status: Institute high risk fall precautions, as indicated     Problem: ABCDS Injury Assessment  Goal: Absence of physical injury  Outcome: Progressing     Problem: Risk for Elopement  Goal: Patient will not exit the unit/facility without proper excort  Outcome: Progressing  Flowsheets (Taken 01/14/2024 0400 by  Genene Gowda, RN)  Nursing Interventions for Elopement Risk:   Reduce environmental triggers   Shoes and clothing collected and placed in gown attire     Problem: Safety - Adult  Goal: Free from fall injury  Outcome: Progressing

## 2024-01-14 NOTE — H&P (Signed)
 [facility] History and Physical    Patient Information:  Patient: Raymond Skinner  MRN: [id number]   Acct: [id number]  Date of Birth: 03-27-1956  Admit Date: 01/14/2024   Primary Care Physician: Joshua Dorothyann SAILOR, APRN  Advance Directive: Full Code  Health Care Proxy: Mrs. [employer], a relative, [phone]         SUBJECTIVE:    Chief Complaint   Patient presents with    Altered Mental Status     Pt family reports that pt has had increased AMS. Pt has hx of Dementia, family at home cannot take care of patient looking for placement.      HPI:  Raymond Skinner is a 68 year old man who is suffering from dementia. Raymond Skinner has been living with his family but thy indicate that Raymond Skinner is now too hard to care for. His family wants nursing home placement. They have indicated that his dementia is worsening. Raymond Skinner has been falling often lately. Raymond Skinner appears to have fallen leading to the right hip fracture that Raymond Skinner has now presented with.     Review of Systems:   Review of Systems   Unable to perform ROS: Dementia     The following subjective subsections are as per chart review.    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 No pertinent surgical history.    Social History       Tobacco History       Smoking Status  Former Smoking Start Date  12/31/1974 Quit Date  2019 Average Packs/Day  0.5 packs/day for 42.4 years (21.2 ttl pk-yrs) Smoking Tobacco Type  Cigarettes from 12/31/1974 to 2019   Pack Year History     Packs/Day From To Years    0 2019  6.7     05/18/2017 2019 0.0    0.5 12/31/1974 05/18/2017 42.4    0.5   0.0      Smokeless Tobacco Use  Never              Alcohol History       Alcohol Use Status  Never              Drug Use       Drug Use Status  Yes Types  Marijuana (Weed) Frequency   0 times/week              Sexual Activity       Sexually Active  Not  Asked             CODE STATUS: Full Code  HEALTH CARE PROXY: Mrs. [employer], a relative, [phone]  DOMICILED: with family at the bedside but planning for a nursing home     Family History   Problem Relation Age of Onset    Diabetes Mother     Dementia Father     No Known Problems Maternal Uncle     Dementia Paternal Aunt     Dementia Maternal Grandmother     Dementia Maternal Grandfather     Dementia Paternal Grandmother     Dementia Paternal Grandfather      Allergies:   No Known Allergies    Home Medications:  Prior to Admission medications   Medication Sig Start Date End Date  Taking? Authorizing Provider   memantine  (NAMENDA ) 10 MG tablet Take 1 tablet by mouth 2 times daily 01/08/24   Joshua Dorothyann SAILOR, APRN   donepezil  (ARICEPT ) 10 MG tablet Take 1 tablet by mouth nightly 01/08/24   Joshua Dorothyann SAILOR, APRN   simvastatin  (ZOCOR ) 20 MG tablet Take 1 tablet by mouth nightly 01/08/24   Joshua Dorothyann SAILOR, APRN   Disulfiram  500 MG TABS Take 1 tablet by mouth daily 01/08/24   Joshua Dorothyann SAILOR, APRN   Multiple Vitamins-Minerals (THERAPEUTIC MULTIVITAMIN-MINERALS) tablet Take 1 tablet by mouth daily    [provider]   aspirin 81 MG chewable tablet Take 1 tablet by mouth daily    [provider]   vitamin B-6 (PYRIDOXINE) 100 MG tablet Take 1 tablet by mouth daily    [provider]   Vitamin D-Vitamin K (VITAMIN K2-VITAMIN D3 PO) Take 90 mcg by mouth daily    [provider]   vitamin E 1000 units capsule Take 1 capsule by mouth daily    [provider]   cyanocobalamin 1000 MCG tablet Take 2.5 tablets by mouth daily    [provider]   magnesium 30 MG tablet Take 250 mg by mouth daily    [provider]   Potassium 99 MG TABS Take by mouth    [provider]   zinc gluconate 50 MG tablet Take 1 tablet by mouth daily    [provider]   Ginkgo 60 MG TABS Take by mouth    [provider]          OBJECTIVE:    Vitals:    01/14/24 0336   BP: (!) 159/99   Pulse: 70   Resp: 16   Temp: 97.5 F (36.4 C)   SpO2: 93%   breathing on Room Air    VS most recent when accepted   BP 104/88   Pulse 70   Temp 98.6 F (37 C) (Temporal)   Resp 20   SpO2 95%     Most recent VS  BP (!) 159/99   Pulse 70   Temp 97.5 F (36.4 C) (Temporal)   Resp 16   SpO2 93%     No intake or output data in the 24 hours ending 01/14/24 0404    Physical Exam  Vitals reviewed.   Constitutional:       General: Raymond Skinner is not in acute distress.     Appearance: Normal appearance. Raymond Skinner is normal weight. Raymond Skinner is not ill-appearing or toxic-appearing.   HENT:      Head: Normocephalic and atraumatic.      Nose: No congestion or rhinorrhea.   Eyes:      General:         Right eye: No discharge.         Left eye: Discharge present.  Cardiovascular:      Rate and Rhythm: Normal rate and regular rhythm.      Heart sounds: No murmur heard.     No friction rub. No gallop.   Pulmonary:      Effort: No respiratory distress.      Breath sounds: No stridor. No wheezing, rhonchi or rales.   Abdominal:      General: Bowel sounds are normal.      Tenderness: There is no abdominal tenderness. There is no guarding or rebound.   Musculoskeletal:         General: Tenderness and signs of injury present.  Skin:     General: Skin is warm.      Findings: No bruising or lesion.   Neurological:      Mental Status: Raymond Skinner is alert.      Cranial Nerves: No dysarthria.   Psychiatric:         Speech: Speech normal.         Behavior: Behavior is agitated. Behavior is cooperative.       LABORATORY DATA:    CBC:   Recent Labs     01/13/24  2356   WBC 7.3   HGB 13.9*   HCT 40.5*   PLT 172     BMP:   Recent Labs     01/13/24  2356   NA 143   K 4.0   CL 107   CO2 28   BUN 18   CREATININE 1.0   CALCIUM 9.2     Hepatic Profile:   Recent Labs     01/13/24  2356   AST 26   ALT 55*   BILITOT 0.3   ALKPHOS 77     Coag Panel:   Recent Labs     01/13/24  2356   INR 1.09   PROTIME 14.0    APTT 31.1     Urinalysis:   Lab Results   Component Value Date/Time    NITRU Negative 01/14/2024 01:54 AM    BLOODU Negative 01/14/2024 01:54 AM    GLUCOSEU 100 01/14/2024 01:54 AM     EKG:   None seen    IMAGING:  CT PELVIS WO CONTRAST Additional Contrast? None  Result Date: 01/14/2024  Right femoral neck fracture  All CT scans are performed using dose optimization techniques as appropriate to the performed exam and include at least one of the following: Automated exposure control, adjustment of the mA and/or kV according to size, and the use of iterative reconstruction technique.  ______________________________________ Electronically signed by: KANDI BEND M.D. Date:     01/14/2024 Time:    01:34     XR HIP 2-3 VW W PELVIS RIGHT  Result Date: 01/14/2024   Subcapital femoral neck fracture with lateral impaction.    ______________________________________ Electronically signed by: GUSTAVO HIGHTOWER M.D. Date:     01/14/2024 Time:    01:22     XR CHEST PORTABLE  Result Date: 01/14/2024   1. No acute findings.      ______________________________________ Electronically signed by: GUSTAVO HIGHTOWER M.D. Date:     01/14/2024 Time:    01:22     CT HEAD WO CONTRAST  Result Date: 01/14/2024  1. No acute intracranial process. 2. Stable atrophy and chronic small vessel ischemic disease  All CT scans are performed using dose optimization techniques as appropriate to the performed exam and include at least one of the following: Automated exposure control, adjustment of the mA and/or kV according to size, and the use of iterative reconstruction technique.  ______________________________________ Electronically signed by: KANDI BEND M.D. Date:     01/14/2024 Time:    00:37           ASESSMENTS & PLANS:    Patient Active Problem List   Diagnosis    Recurrent falls    Dementia (HCC)    Closed fracture of right femur, initial encounter [facility])         Traumatic Right Closed Displaced Femoral Fracture: Initial Encounter  Admit to  Medical-Surgical Ward under Hospitalist Team's Care  Ortho consultant to see in AM  Narcan PRN  patient safety   Tylenol 1g PO Q8h for basal pain control  Oxycodone 2.5-5-10mg  PO Mild-Moderate-Severe Pain Scale  Morphine 1-2-4mg  IV Mild-Moderate-Severe Pain Scale for when NPO or PO med fails  PT/INR and PTT and Type & Screen added on to ED blood work  CBC with Diff and BMP with Mag reflex daily from tomorrow   NPO from MN except sips with meds  Foley Care  Insert Foley for perioperative usage  Strict Is and Os  Daily Weights  Elevate HoB 30-45  Bed Rest  Add-on 25-HO Vitamin D level  Vitamin D 5,000 units PO QDay   TXA on call to OR as per hospital system policy  Cefazolin 2g IV to OR as per hospital system policy  EKG for screening as per age >=65 years os Hx of or suspected CAD    Altered Mental Status   Tele  AVIS sytem for Video Telesitter  Bed Alarm  Fall Precautions  Urinalysis negative  UDS showed pan-negative  CXR 2-View showed no evidence acute  CT Head showed no acute intracranial disease, diffuse microvascular ischemic changes  Neuro consultation in AM   CBC with Diff daily  BMP with Mag reflex daily   aspirin  81 mg Oral Daily    cyanocobalamin  2,000 mcg Oral Daily    disulfiram   500 mg Oral Daily    donepezil   10 mg Oral Nightly    Magnesium Gluconate  500 mg Oral Daily    memantine   10 mg Oral BID    therapeutic multivitamin-minerals  1 tablet Oral Daily    atorvastatin  10 mg Oral Daily    vitamin B-6  100 mg Oral Daily    vitamin D  5,000 Units Oral Daily    vitamin E  800 Units Oral Daily    zinc sulfate  50 mg Oral Daily     Chronic medical problems: continue home regimen as indicated    Supportive and Prophylactic Txx:  DVT PPx: SCDs as per system policy  GI (PUD) PPx: not indicated  PT: defer to Ortho  Diet NPO Exceptions are: Sips of Water with Meds  hydrALAZINE, morphine, morphine, morphine, oxyCODONE, oxyCODONE, oxyCODONE, naloxone, ondansetron, promethazine, promethazine, sodium chloride  flush, sodium chloride, potassium chloride **OR** potassium alternative oral replacement **OR** potassium chloride, magnesium sulfate, sodium phosphate 13.26 mmol in sodium chloride 0.9 % 250 mL IVPB **OR** sodium phosphate 26.49 mmol in sodium chloride 0.9 % 250 mL IVPB, metaxalone, polyethylene glycol, melatonin, calcium carbonate       , 75 minute EXCLUDES SEPARATELY BILLABLE SERVICES' TIME  Pt seen/examined and admitted to inpstor status.  Inpatient status is used for patients with an expected LOS extending past two midnights due to medical therapy and or critical care needs, otherwise patients are placed to OBServation status.    Signed:  Electronically signed by Lamar Pump, MD on 01/14/24 at 4:28 AM CDT.

## 2024-01-14 NOTE — Consults (Signed)
 Orthopaedic Inpatient Consultation    NAME:  Raymond Skinner   DOB:    02-04-1956  MRN:    [id number]    01/13/2024 11:38 PM        CHIEF COMPLAINT:  right hip pain      HISTORY OF PRESENT ILLNESS:   The patient is a 68 y.o. male who presents with the above complaint after multiple falls at home. He has dementia and family reports that it has progressed quickly in the last few months. Sister-in-law and brother at bedside to confirm HPI and they say he fell multiple times, specifically in the shower, on Wednesday. Family was unable to get patient back up after most recent fall and called EMS. He presented to [facility] ED and imaging showed right subcapital femoral neck fracture with impaction. Orthopedics consulted for further evaluation and management.     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 No pertinent surgical history.    Current Medications:   Prior to Admission medications   Medication Sig Start Date End Date Taking? Authorizing Provider   memantine  (NAMENDA ) 10 MG tablet Take 1 tablet by mouth 2 times daily 01/08/24   Joshua Dorothyann SAILOR, APRN   donepezil  (ARICEPT ) 10 MG tablet Take 1 tablet by mouth nightly 01/08/24   Joshua Dorothyann SAILOR, APRN   simvastatin  (ZOCOR ) 20 MG tablet Take 1 tablet by mouth nightly 01/08/24   Joshua Dorothyann SAILOR, APRN   Disulfiram  500 MG TABS Take 1 tablet by mouth daily 01/08/24   Joshua Dorothyann SAILOR, APRN   Multiple Vitamins-Minerals (THERAPEUTIC MULTIVITAMIN-MINERALS) tablet Take 1 tablet by mouth daily    [provider]   aspirin 81 MG chewable tablet Take 1 tablet by mouth daily    [provider]   vitamin B-6 (PYRIDOXINE) 100 MG tablet Take 1 tablet by mouth daily    [provider]   Vitamin D-Vitamin K (VITAMIN K2-VITAMIN D3 PO) Take 90 mcg by mouth daily    [provider]   vitamin E 1000 units capsule Take 1 capsule by mouth daily     [provider]   cyanocobalamin 1000 MCG tablet Take 2.5 tablets by mouth daily    [provider]   magnesium 30 MG tablet Take 250 mg by mouth daily    [provider]   Potassium 99 MG TABS Take by mouth    [provider]   zinc gluconate 50 MG tablet Take 1 tablet by mouth daily    [provider]   Ginkgo 60 MG TABS Take by mouth    [provider]       Allergies:  Patient has no known allergies.    Social History:   Social History     Socioeconomic History    Marital status: Divorced     Spouse name: Not on file    Number of children: Not on file    Years of education: Not on file    Highest education level: Not on file   Occupational History    Not on file   Tobacco Use    Smoking status: Former     Current packs/day: 0.00     Average packs/day: 0.5 packs/day for 42.4 years (21.2 ttl pk-yrs)  Types: Cigarettes     Start date: 12/31/1974     Quit date: 2019     Years since quitting: 6.6    Smokeless tobacco: Never   Vaping Use    Vaping status: Never Used   Substance and Sexual Activity    Alcohol use: Never    Drug use: Yes     Types: Marijuana Oda)    Sexual activity: Not on file   Other Topics Concern    Not on file   Social History Narrative    CODE STATUS: Full Code    HEALTH CARE PROXY: Mrs. [employer], a relative, +[phone]    DOMICILED: with family at the bedside but planning for a nursing home     Social Drivers of Health     Financial Resource Strain: Low Risk  (11/05/2022)    Overall Financial Resource Strain (CARDIA)     Difficulty of Paying Living Expenses: Not hard at all   Food Insecurity: Patient Unable To Answer (01/14/2024)    Hunger Vital Sign     Worried About Running Out of Food in the Last Year: Patient unable to answer     Ran Out of Food in the Last Year: Patient unable to answer   Transportation Needs: Patient Unable To Answer (01/14/2024)    PRAPARE - Transportation     Lack of Transportation (Medical): Patient unable  to answer     Lack of Transportation (Non-Medical): Patient unable to answer   Physical Activity: Insufficiently Active (01/08/2024)    Exercise Vital Sign     Days of Exercise per Week: 5 days     Minutes of Exercise per Session: 10 min   Stress: Not on file   Social Connections: Not on file   Intimate Partner Violence: Not on file   Housing Stability: Patient Unable To Answer (01/14/2024)    Housing Stability Vital Sign     Unable to Pay for Housing in the Last Year: Patient unable to answer     Number of Times Moved in the Last Year: 0     Homeless in the Last Year: Patient unable to answer       Family History:   Family History   Problem Relation Age of Onset    Diabetes Mother     Dementia Father     No Known Problems Maternal Uncle     Dementia Paternal Aunt     Dementia Maternal Grandmother     Dementia Maternal Grandfather     Dementia Paternal Grandmother     Dementia Paternal Grandfather        REVIEW OF SYSTEMS:  14 point review of systems has been reviewed from the patient's emergency room visit, reviewed with the patient on today's date with no new changes.    PHYSICAL EXAM:      Physical Examination:  Vitals:   Vitals:    01/14/24 0251 01/14/24 0336 01/14/24 0755 01/14/24 1030   BP: (!) 124/90 (!) 159/99 (!) 162/97    Pulse: 66 70 64    Resp: 20 16 16     Temp:  97.5 F (36.4 C) 99.1 F (37.3 C)    TempSrc:  Temporal Temporal    SpO2: 100% 93% 94%    Weight:    82.6 kg (182 lb 1.6 oz)   Height:    1.854 m (6' 1)     General:  Appears stated age, no distress.  Orientation:  Lethargic and confused to place and situation.  Mood and Affect:  Cooperative and pleasant.  Gait:  Resting comfortably in bed.  Cardiovascular:  Symmetric 1-2 plus pulses in upper and lower extremities.  Lymph:  No cervical or inguinal lymphadenopathy noted.  Sensation:  Grossly intact to light touch.  DTR:  Normal, no pathologic reflexes.  Coordination/balance:  Normal    Musculoskeletal:    Right lower extremity exam:  No obvious  deformity, but tender to palpation about the hip. +2 palpable dorsalis pedis pulse, cap refill < 2 seconds in extremity      DATA:    CBC with Differential:    Lab Results   Component Value Date/Time    WBC 7.3 01/13/2024 11:56 PM    RBC 4.48 01/13/2024 11:56 PM    HGB 13.9 01/13/2024 11:56 PM    HCT 40.5 01/13/2024 11:56 PM    PLT 172 01/13/2024 11:56 PM    MCV 90.4 01/13/2024 11:56 PM    MCH 31.0 01/13/2024 11:56 PM    MCHC 34.3 01/13/2024 11:56 PM    RDW 13.1 01/13/2024 11:56 PM    LYMPHOPCT 18.7 01/13/2024 11:56 PM    MONOPCT 7.2 01/13/2024 11:56 PM    EOSPCT 1.1 01/13/2024 11:56 PM    BASOPCT 0.5 01/13/2024 11:56 PM    MONOSABS 0.50 01/13/2024 11:56 PM    LYMPHSABS 1.4 01/13/2024 11:56 PM    EOSABS 0.10 01/13/2024 11:56 PM    BASOSABS 0.00 01/13/2024 11:56 PM     CMP:    Lab Results   Component Value Date/Time    NA 144 01/14/2024 04:15 AM    K 3.8 01/14/2024 04:15 AM    K 4.0 01/13/2024 11:56 PM    CL 108 01/14/2024 04:15 AM    CO2 26 01/14/2024 04:15 AM    BUN 16 01/14/2024 04:15 AM    CREATININE 0.9 01/14/2024 04:15 AM    LABGLOM >90 01/14/2024 04:15 AM    GLUCOSE 153 01/14/2024 04:15 AM    CALCIUM 9.7 01/14/2024 04:15 AM    BILITOT 0.3 01/13/2024 11:56 PM    ALKPHOS 77 01/13/2024 11:56 PM    AST 26 01/13/2024 11:56 PM    ALT 55 01/13/2024 11:56 PM     BMP:    Lab Results   Component Value Date/Time    NA 144 01/14/2024 04:15 AM    K 3.8 01/14/2024 04:15 AM    K 4.0 01/13/2024 11:56 PM    CL 108 01/14/2024 04:15 AM    CO2 26 01/14/2024 04:15 AM    BUN 16 01/14/2024 04:15 AM    CREATININE 0.9 01/14/2024 04:15 AM    CALCIUM 9.7 01/14/2024 04:15 AM    LABGLOM >90 01/14/2024 04:15 AM    GLUCOSE 153 01/14/2024 04:15 AM         Radiology: CT PELVIS WO CONTRAST Additional Contrast? None  Result Date: 01/14/2024  EXAM:  CT OF THE PELVIS WITHOUT CONTRAST.  HISTORY:  Pelvic trauma and hip pain  TECHNIQUE:   Multiplanar CT images through the pelvis were obtained without the administration of IV contrast.  FINDINGS:   Atherosclerotic vascular calcifications.  There is a mildly displaced impacted fracture of the right femoral neck. No other fractures are seen. No dislocation. Moderate narrowing of bilateral hip joints with osteophyte formation.  Degenerative changes within the lower lumbar spine.      Right femoral neck fracture  All CT scans are performed using dose optimization techniques as appropriate to the performed exam and include at least one of the  following: Automated exposure control, adjustment of the mA and/or kV according to size, and the use of iterative reconstruction technique.  ______________________________________ Electronically signed by: KANDI BEND M.D. Date:     01/14/2024 Time:    01:34     XR HIP 2-3 VW W PELVIS RIGHT  Result Date: 01/14/2024  EXAM:  PELVIS, 1 VIEW;  RIGHT HIP, 2 VIEW  HISTORY:  Right hip pain.  COMPARISON: None available.  FINDINGS:  There appears to be a lateral subcapital femoral neck fracture with slight impaction.  Enthesophyte formation is present about the iliac crests.  Degenerative change sacroiliac joints.  No focal lytic/blastic lesion or cortical erosion.  Soft tissues are  grossly unremarkable.       Subcapital femoral neck fracture with lateral impaction.    ______________________________________ Electronically signed by: GUSTAVO HIGHTOWER M.D. Date:     01/14/2024 Time:    01:22     XR CHEST PORTABLE  Result Date: 01/14/2024  EXAM:  FRONTAL VIEW OF THE CHEST.  HISTORY:  Altered mental status.  Fall  COMPARISON:  12/17/2023.  FINDINGS:   Cardiac silhouette is normal.  No organized infiltrate/consolidation.  No visible effusion or pneumothorax.  No acute osseous abnormality.       1. No acute findings.      ______________________________________ Electronically signed by: GUSTAVO HIGHTOWER M.D. Date:     01/14/2024 Time:    01:22     CT HEAD WO CONTRAST  Result Date: 01/14/2024  EXAM:  CT OF THE HEAD WITHOUT CONTRAST.  HISTORY:  Altered mental status, head trauma   COMPARISON:  Head  CT 12/17/2023  TECHNIQUE:  Multiplanar CT images through the head were obtained without the administration of IV contrasts.  FINDINGS:  The visualized paranasal sinuses and mastoid air cells are clear in general. No acute calvarial abnormalities.  Intracranially there is stable mild atrophy. No dominant mass or midline shift. No hydrocephalus. No acute intracranial hemorrhage or abnormal extraaxial fluid collections. No change in the periventricular and subcortical white matter hypodensities.      1. No acute intracranial process. 2. Stable atrophy and chronic small vessel ischemic disease  All CT scans are performed using dose optimization techniques as appropriate to the performed exam and include at least one of the following: Automated exposure control, adjustment of the mA and/or kV according to size, and the use of iterative reconstruction technique.  ______________________________________ Electronically signed by: KANDI BEND M.D. Date:     01/14/2024 Time:    00:37     CT HEAD WO CONTRAST  Result Date: 12/17/2023    EXAM: CT HEAD WITHOUT CONTRAST  HISTORY: Confusion  COMPARISON:  None  TECHNIQUE:  Serial axial images of the brain were obtained from the skull base to the vertex without IV contrast.  FINDINGS:  No acute intracranial hemorrhage.  No hydrocephalus.  No midline shift.  Parenchymal volume loss.  Chronic microangiopathy.  The calvarium is intact.         No acute intracranial abnormality.  All CT scans are performed using dose optimization techniques as appropriate to the performed exam and include at least one of the following: Automated exposure control, adjustment of the mA and/or kV according to size, and the use of iterative reconstruction technique.  ______________________________________ Electronically signed by: TORIBIO NECESSARY D.O. Date:     12/17/2023 Time:    16:31     XR CHEST PORTABLE  Result Date: 12/17/2023  EXAM: CHEST RADIOGRAPH (1 VIEW)  HISTORY: Confusion  COMPARISON: None  FINDINGS:  Lines/tubes:  None  Lungs and Pleura:  No focal consolidation.  No pleural effusion.  No pneumothorax.  Heart/vasculature: Heart size and pulmonary vascularity are within normal limits.  Bones/Soft Tissues: No acute findings. Scattered chronic degenerative changes.       No acute cardiopulmonary abnormality.    ______________________________________ Electronically signed by: DEBBY RIEDEL D.O. Date:     12/17/2023 Time:    16:05       Assessment:     Right subcapital femoral neck fracture    Plan:    - OR for total hip arthroplasty today with Dr. Tobie   - maintain NPO   - type and screen active  - obtain consent for procedure from [facility]    I explained to the family the patient's diagnosis and operative procedure in detail. They said they understood basically what was wrong and how the surgeon plans to fix it. They understand the expected recovery and the risks which include excessive bleeding, infection, reaction to anesthesia, nerve injury, stiffness and deformity. They are agreeable to go forward with a total hip arthroplasty.      Provider: Lauraine LITTIE Bunker, APRN - CNP  Date: 01/14/2024

## 2024-01-15 MED ORDER — QUETIAPINE FUMARATE 25 MG PO TABS
25 | Freq: Every evening | ORAL | Status: DC
Start: 2024-01-15 — End: 2024-01-19
  Administered 2024-01-16 – 2024-01-19 (×4): 25 mg via ORAL

## 2024-01-15 MED FILL — THEREMS-M PO TABS: ORAL | Qty: 1

## 2024-01-15 MED FILL — MEMANTINE HCL 5 MG PO TABS: 5 mg | ORAL | Qty: 2

## 2024-01-15 MED FILL — MORPHINE SULFATE 2 MG/ML IJ SOLN: 2 mg/mL | INTRAMUSCULAR | Qty: 1

## 2024-01-15 MED FILL — ACETAMINOPHEN 325 MG PO TABS: 325 mg | ORAL | Qty: 2

## 2024-01-15 MED FILL — DISULFIRAM 250 MG PO TABS: 250 mg | ORAL | Qty: 2

## 2024-01-15 MED FILL — ASPIRIN 325 MG PO TBEC: 325 mg | ORAL | Qty: 1

## 2024-01-15 MED FILL — CEFAZOLIN SODIUM 1 G IJ SOLR: 1 g | INTRAMUSCULAR | Qty: 2000

## 2024-01-15 MED FILL — ATORVASTATIN CALCIUM 10 MG PO TABS: 10 mg | ORAL | Qty: 1

## 2024-01-15 MED FILL — ZINC SULFATE 220 (50 ZN) MG PO CAPS: 220 (50 Zn) MG | ORAL | Qty: 1

## 2024-01-15 MED FILL — MELOXICAM 7.5 MG PO TABS: 7.5 mg | ORAL | Qty: 1

## 2024-01-15 MED FILL — VITAMIN B-6 100 MG PO TABS: 100 mg | ORAL | Qty: 1 | Fill #0

## 2024-01-15 MED FILL — VITAMIN E 180 MG (400 UNIT) PO CAPS: 180 MG (400 UNIT) | ORAL | Qty: 2

## 2024-01-15 MED FILL — VITAMIN D3 ULTRA STRENGTH 125 MCG (5000 UT) PO CAPS: 125 MCG (5000 UT) | ORAL | Qty: 1

## 2024-01-15 MED FILL — HYDRALAZINE HCL 20 MG/ML IJ SOLN: 20 mg/mL | INTRAMUSCULAR | Qty: 1

## 2024-01-15 MED FILL — DONEPEZIL HCL 10 MG PO TABS: 10 mg | ORAL | Qty: 1

## 2024-01-15 MED FILL — VITAMIN B-12 500 MCG PO TABS: 500 ug | ORAL | Qty: 4

## 2024-01-15 MED FILL — MAG-G 500 (27 MG) MG PO TABS: 500 (27 Mg) MG | ORAL | Qty: 1

## 2024-01-15 NOTE — Care Coordination-Inpatient (Signed)
 SW spoke with [employer] and she stated that if needed, she will be willing to become patients guardian if he were to fail his cognitive evaluation

## 2024-01-15 NOTE — Discharge Instructions (Addendum)
.   Good nutrition is important when healing from an illness, injury, or surgery.  Follow any nutrition recommendations given to you during your hospital stay.   . If you were given an oral nutrition supplement while in the hospital, continue to take this supplement at home.  You can take it with meals, in-between meals, and/or before bedtime. These supplements can be purchased at most local grocery stores, pharmacies, and chain super-stores.  If you have any questions about your diet or nutrition, call the hospital and ask for the dietitian.  Regular diet

## 2024-01-15 NOTE — Care Coordination-Inpatient (Signed)
 Referral sent to [facility]  Phone: [phone]  Fax: [phone]  Admissions Fax: [phone]  Electronically signed by Consuelo Macario Gleason on 01/15/2024 at 11:12 AM

## 2024-01-15 NOTE — Progress Notes (Signed)
 Physical Therapy  Facility/Department: [facility]  Physical Therapy Initial Assessment    Name: Raymond Skinner  DOB: 1955-05-28  MRN: [id number]  Date of Service: 01/15/2024    Discharge Recommendations:  Continue to assess pending progress, Patient would benefit from continued therapy after discharge (ANTICIPATE DC TO FACILITY FOR REHAB)          Patient Diagnosis(es): The primary encounter diagnosis was Delirium superimposed on dementia. Diagnoses of Fall from standing, initial encounter and Closed subcapital fracture of right femur, initial encounter [facility]) were also pertinent to this visit.  Past Medical History:  has a past medical history of Dementia (HCC) and Epstein Barr infection.  Past Surgical History:  has no past surgical history on file.    Assessment  Body Structures, Functions, Activity Limitations Requiring Skilled Therapeutic Intervention: Decreased functional mobility ;Decreased ROM;Decreased strength;Decreased endurance;Increased pain  Assessment: pt WOULD BENEFIT FROM SKILLED PT IN THIS SETTING TO PROGRESS HIS MOBILITY POST OP R FEMUR FX/THR AND TO ASSIST IN DC PLANNING  Therapy Prognosis: Good  Decision Making: Low Complexity  Requires PT Follow-Up: Yes  Activity Tolerance  Activity Tolerance: Patient tolerated treatment well    Plan  Physical Therapy Plan  General Plan: 5-7 times per week  Therapy Duration: 2 Weeks  Current Treatment Recommendations: Strengthening, Balance training, Functional mobility training, Transfer training, Gait training, Safety education & training, Positioning, Pain management, Patient/Caregiver education & training, Stair training, Therapeutic activities  Additional Comments: ASSIST OF TWO. PROGRESS AS DEMENTIA WILL ALLOW.  Safety Devices  Type of Devices: Call light within reach, Gait belt, Left in bed, Bed alarm in place, Nurse notified    Restrictions  Restrictions/Precautions  Restrictions/Precautions: Weight Bearing  Lower Extremity Weight Bearing  Restrictions  Right Lower Extremity Weight Bearing: Weight Bearing As Tolerated     Subjective  General  Diagnosis: FALL R HIP FX. R THR BY DR DOZIER. HX OF DEMENTIA  Subjective  Subjective: pt ABLE TO STATE HIS NAME AND DID WANT PAIN MEDICINE WHEN ASKED. RN NOTIFIED         Social/Functional History  Social/Functional History  Additional Comments: PLOF UNKNOWN DUE TO DEMENTIA  Vision/Hearing  Vision  Vision:  (GLASSES NOTED IN ROOM AND PLACED ON pt)  Hearing  Hearing: Within Functional Limits    Cognition   Orientation  Orientation Level: Oriented to person  Cognition  Overall Cognitive Status: Exceptions  Arousal/Alertness: Appears intact  Following Commands: Inconsistently follows commands  Attention Span: Difficulty attending to directions;Difficulty dividing attention  Memory: Impaired  Safety Judgement: Impaired  Problem Solving: Impaired  Insights: Not aware of deficits  Initiation: Requires cues for all  Sequencing: Requires cues for all    Objective  Temp: 98.7 F (37.1 C)  Pulse: 83  Heart Rate Source: Monitor  Respirations: 14  SpO2: 93 %  O2 Device: None (Room air)  BP: 139/82  MAP (Calculated): 101  BP Location: Left upper arm  Patient Position: Supine     Observation/Palpation  Observation: FOLEY CATH. SET UP BFAST FOR pt BUT HE WAS UNABLE TO GRASP AND USE A UTENSIL. ALSO UNABLE TO  HOLD A CUP TO TAKE A DRINK. RN NOTIFIED pt WOULD HAVE TO BE FED          PROM RLE (degrees)  RLE PROM: WFL  PROM LLE (degrees)  LLE PROM: WFL  Strength Other  Other: UNABLE TO FULLY ASSESS DUE OT DEMENTIA BUT DID OBSERVE ACTIVE MOVEMENT IN BILAT LE'S AND ABILITY TO USE L  LE FOR SCOOTING           Bed mobility  Supine to Sit: Substantial/Maximal assistance;2 Person assistance  Sit to Supine: Substantial/Maximal assistance;2 Person assistance  Scooting: Partial/Moderate assistance;2 Person assistance  Bed Mobility Comments: Pt WAS ABLE TO USE L LE TO ASSIST WITH SCOOTING TOWARD HOB  Transfers  Comment: ABLE TO INTIATE A  SLIGHT LIFT OF BUTTOCKS FOR SIT TO STAND BUT THEN UNABLE TO CONTINUE DUE TO DEMENTIA AND PAIN        Balance  Comments: REQUIRED INTERMITTENT ASSIST FOR BALANCE SITTING EOB          OutComes Score                                                  AM-PAC - Mobility              Tinneti Score       Goals  Short Term Goals  Time Frame for Short Term Goals: 2 WKS  Short Term Goal 1: SUP<>SIT, MIN A  Short Term Goal 2: SIT<>STAND, MIN A  Short Term Goal 3: TF'S WITH RW, MIN A  Short Term Goal 4: AMB 25 FT WITH RW, MIN A       Education  Patient Education  Education Given To: Patient  Education Provided: Role of Therapy;Plan of Care;Precautions;Mobility Training  Education Method: Demonstration;Verbal  Barriers to Learning: Cognition  Education Outcome: Continued education needed      Therapy Time   Individual Concurrent Group Co-treatment   Time In           Time Out           Minutes                   Charlies Carpen, PT   Electronically signed by Charlies Carpen, PT on 01/15/2024 at 10:34 AM

## 2024-01-15 NOTE — Progress Notes (Signed)
 Subjective:     Post-Operative Day: 1 No complaints    Objective:   Patient is awake today but is not able to communicate due to dementia  Patient Vitals for the past 24 hrs:   BP Temp Temp src Pulse Resp SpO2 Height Weight   01/15/24 0425 (!) 123/91 99 F (37.2 C) Temporal 94 14 92 % -- --   01/14/24 2359 (!) 147/81 98.6 F (37 C) Temporal 91 14 94 % -- --   01/14/24 2250 (!) 142/76 98.6 F (37 C) Temporal 94 14 95 % -- --   01/14/24 2130 (!) 159/98 99 F (37.2 C) Temporal 86 16 92 % -- --   01/14/24 2036 139/82 97.6 F (36.4 C) Temporal 87 14 95 % -- --   01/14/24 1958 (!) 157/81 97.7 F (36.5 C) Temporal 91 14 98 % -- --   01/14/24 1928 128/85 97.2 F (36.2 C) Temporal 81 16 96 % -- --   01/14/24 1838 (!) 157/81 98.8 F (37.1 C) Temporal 70 16 92 % -- --   01/14/24 1810 -- -- -- 71 14 93 % -- --   01/14/24 1805 -- 99.8 F (37.7 C) Temporal 70 22 93 % -- --   01/14/24 1800 (!) 157/75 -- -- 71 16 95 % -- --   01/14/24 1755 -- -- -- 92 19 95 % -- --   01/14/24 1750 -- -- -- 74 (!) 8 93 % -- --   01/14/24 1745 (!) 152/80 -- -- 78 10 92 % -- --   01/14/24 1740 -- -- -- 79 10 94 % -- --   01/14/24 1735 -- -- -- 79 (!) 9 92 % -- --   01/14/24 1730 (!) 147/81 -- -- 76 11 94 % -- --   01/14/24 1725 -- -- -- 80 15 97 % -- --   01/14/24 1720 (!) 169/88 -- -- 80 10 100 % -- --   01/14/24 1715 (!) 158/81 -- -- 81 10 100 % -- --   01/14/24 1710 (!) 143/85 -- -- 95 20 100 % -- --   01/14/24 1706 132/77 100 F (37.8 C) Temporal 71 (!) 9 99 % -- --   01/14/24 1510 -- -- -- 64 16 97 % -- --   01/14/24 1505 -- -- -- 65 18 95 % -- --   01/14/24 1500 -- -- -- 63 14 96 % -- --   01/14/24 1455 -- -- -- 64 19 98 % -- --   01/14/24 1451 (!) 170/85 -- -- 64 17 91 % -- --   01/14/24 1446 (!) 162/94 -- -- 66 16 (!) 86 % -- --   01/14/24 1445 -- -- -- -- 18 -- -- --   01/14/24 1030 -- -- -- -- -- -- 1.854 m (6' 1) 82.6 kg (182 lb 1.6 oz)   01/14/24 0755 (!) 162/97 99.1 F (37.3 C) Temporal 64 16 94 % -- --       General: Alert     Wound: Clean dry intact.  Moderate swelling   Neurovascular: Exam normal he can wiggle his toes.  His foot is warm with good dorsalis pedis pulse   DVT Exam: negative         Data Review:  Recent Labs     01/13/24  2356 01/15/24  0305   HGB 13.9* 13.4*     Recent Labs     01/13/24  2356 01/14/24  0415 01/15/24  0305  NA 143   < > 142   K 4.0   < > 4.3   CREATININE 1.0   < > 0.9   GLUCOSE 170*   < > 178*   INR 1.09  --   --     < > = values in this interval not displayed.     No results for input(s): POCGLU in the last 72 hours.  XR HIP 2-3 VW W PELVIS RIGHT   Final Result       1. Interval right total hip arthroplasty with intact hardware in satisfactory position.   2. Foley catheter.   3. Mild sacroiliac and left femoral osteoarthritis.                   .           ______________________________________    Electronically signed by: LONNI BERKSHIRE D.O.   Date:     01/14/2024   Time:    18:09       CT PELVIS WO CONTRAST Additional Contrast? None   Final Result   Right femoral neck fracture        All CT scans are performed using dose optimization techniques as appropriate to the performed exam and include    at least one of the following: Automated exposure control, adjustment of the mA and/or kV according to size, and the use of iterative reconstruction technique.        ______________________________________    Electronically signed by: KANDI BEND M.D.   Date:     01/14/2024   Time:    01:34       XR CHEST PORTABLE   Final Result       1. No acute findings.                       ______________________________________    Electronically signed by: GUSTAVO HIGHTOWER M.D.   Date:     01/14/2024   Time:    01:22       XR HIP 2-3 VW W PELVIS RIGHT   Final Result       Subcapital femoral neck fracture with lateral impaction.               ______________________________________    Electronically signed by: GUSTAVO HIGHTOWER M.D.   Date:     01/14/2024   Time:    01:22       CT HEAD WO CONTRAST   Final Result   1. No  acute intracranial process.   2. Stable atrophy and chronic small vessel ischemic disease        All CT scans are performed using dose optimization techniques as appropriate to the performed exam and include    at least one of the following: Automated exposure control, adjustment of the mA and/or kV according to size, and the use of iterative reconstruction technique.        ______________________________________    Electronically signed by: KANDI BEND M.D.   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 Doing well postop without complications .   Plan:   Weight-bear as tolerated  Pain control  Keep accuchecks under 200  PT/OT  DVT prophylaxis  Ice and elevate  Discharge To a non-[place] facility next week

## 2024-01-15 NOTE — Discharge Instructions (Signed)
 [facility]   Dr.  Robert Chimes      Total Hip & Bipolar Replacement  Home Instructions     To prevent blood clots, you have been placed on the following medication:  Aspirin  81 mg twice a day for four weeks    Surgical Site Care:  Showering is permitted on post op day 2 - Saturday  No submersion in a bath, swimming pool, whirlpool, etc     Home physical therapy 2 days a week and a once a week nurse will be arranged before you get home    Weight Bearing Status:  Full unless you were told otherwise  Use a walker    Precautions  Don't walk for exercise (The longer you are on your feet the more sore you will be)  Stay close to home  You may go for short car rides    Pain Medications  You were given a prescription to fill at your pharmacy  Wean off pain medications as you deem appropriate as long as pain is under control  Take tylenol  instead of the pain medicine as you improve                                                                                                                           FEVER of 101.5 or less  Please take a stool softener such as Colace to prevent constipation                   Tylenol  x 2                                                                                                                                       Deep breath x 10  Do not drive until the surgeon releases you                                                          Cough, cough, cough  DO NOT SMOKE, VAPE OR CHEW!!!  Recheck in 1 - 11/2 hours    Cold packs  May be used as necessary  Be sure to have a barrier (cloth, clothing, towel) between the site and the ice pack to prevent frostbite    Contact office if  Increased redness, swelling, drainage of any kind, and/or severe pain at surgery site.  As well as new onset fevers and or chills.  These could signify an infection.  Calf tenderness to touch as well as increased swelling or  redness.  This could signify a clot.  Any rash appears, increased  or new onset nausea/vomiting occur.  This may indicate a reaction to a medication.     Phone # [phone] option 3. Leave a message for my assistant.    If you have an emergency text me at [phone] and tell me your name and problem  (Dr. Doc Freed)  Request any pain medication refills through My Chart  Follow up with Surgeon at scheduled appointment time.

## 2024-01-15 NOTE — Progress Notes (Signed)
 Daily Progress Note    Date:01/15/2024  Patient: Raymond Skinner  DOB: Sep 28, 1955  FMW:[id number]  CODE:Full Code No additional code details  ERE:Gnwzd, Dorothyann SAILOR, APRN    Admit Date: 01/13/2024 11:38 PM   LOS: 1 day     Chief Complaint   Patient presents with    Altered Mental Status     Pt family reports that pt has had increased AMS. Pt has hx of Dementia, family at home cannot take care of patient looking for placement.          Subjective: POD 1 s/p right THA. Pt remains disoriented, states that he needs to get to his car and drive to the hospital. Denies pain.         Hospital Summary: 68 yo M with dementia who presented to [facility] ED with worsening confusion and recurrent falls.  He was found to have a right femoral neck fracture and admitted to the hospitalist service for further management.  No source of infection identified.     Orthopedics was consulted and pt underwent right THA on 01/14/24.  He remained disoriented post-operatively.   Cog eval pending as family plans to pursue guardianship.         Review of Systems   Unable to perform ROS: Mental status change       Objective:      Vital signs in last 24 hours:  Patient Vitals for the past 24 hrs:   BP Temp Temp src Pulse Resp SpO2   01/15/24 1041 131/82 98.5 F (36.9 C) Temporal 94 14 99 %   01/15/24 0927 -- -- -- -- -- 93 %   01/15/24 0859 139/82 98.7 F (37.1 C) Temporal 83 14 90 %   01/15/24 0425 (!) 123/91 99 F (37.2 C) Temporal 94 14 92 %   01/14/24 2359 (!) 147/81 98.6 F (37 C) Temporal 91 14 94 %   01/14/24 2250 (!) 142/76 98.6 F (37 C) Temporal 94 14 95 %   01/14/24 2130 (!) 159/98 99 F (37.2 C) Temporal 86 16 92 %   01/14/24 2036 139/82 97.6 F (36.4 C) Temporal 87 14 95 %   01/14/24 1958 (!) 157/81 97.7 F (36.5 C) Temporal 91 14 98 %   01/14/24 1928 128/85 97.2 F (36.2 C) Temporal 81 16 96 %   01/14/24 1838 (!) 157/81 98.8 F (37.1 C) Temporal 70 16 92 %   01/14/24 1810 -- -- -- 71 14 93 %   01/14/24 1805 -- 99.8 F (37.7 C) Temporal  70 22 93 %   01/14/24 1800 (!) 157/75 -- -- 71 16 95 %   01/14/24 1755 -- -- -- 92 19 95 %   01/14/24 1750 -- -- -- 74 (!) 8 93 %   01/14/24 1745 (!) 152/80 -- -- 78 10 92 %   01/14/24 1740 -- -- -- 79 10 94 %   01/14/24 1735 -- -- -- 79 (!) 9 92 %   01/14/24 1730 (!) 147/81 -- -- 76 11 94 %   01/14/24 1725 -- -- -- 80 15 97 %   01/14/24 1720 (!) 169/88 -- -- 80 10 100 %   01/14/24 1715 (!) 158/81 -- -- 81 10 100 %   01/14/24 1710 (!) 143/85 -- -- 95 20 100 %   01/14/24 1706 132/77 100 F (37.8 C) Temporal 71 (!) 9 99 %   01/14/24 1510 -- -- -- 64 16 97 %   01/14/24  1505 -- -- -- 65 18 95 %   01/14/24 1500 -- -- -- 63 14 96 %   01/14/24 1455 -- -- -- 64 19 98 %   01/14/24 1451 (!) 170/85 -- -- 64 17 91 %   01/14/24 1446 (!) 162/94 -- -- 66 16 (!) 86 %   01/14/24 1445 -- -- -- -- 18 --       I/O last 3 completed shifts:  In: 0   Out: 1300 [Urine:1200; Blood:100]  No intake/output data recorded.    Physical Exam  Constitutional:       General: He is not in acute distress.     Appearance: He is not toxic-appearing.   Cardiovascular:      Rate and Rhythm: Normal rate and regular rhythm.      Pulses: Normal pulses.      Heart sounds: Normal heart sounds.   Pulmonary:      Effort: Pulmonary effort is normal. No respiratory distress.      Breath sounds: Normal breath sounds.   Abdominal:      General: Bowel sounds are normal. There is no distension.      Palpations: Abdomen is soft.      Tenderness: There is no abdominal tenderness.             Lab Review   Recent Results (from the past 24 hours)   Basic Metabolic Panel w/ Reflex to MG    Collection Time: 01/15/24  3:05 AM   Result Value Ref Range    Sodium 142 136 - 145 mmol/L    Potassium reflex Magnesium  4.3 3.5 - 5.0 mmol/L    Chloride 106 98 - 107 mmol/L    CO2 26 22 - 29 mmol/L    Anion Gap 10 8 - 16 mmol/L    Glucose 178 (H) 70 - 99 mg/dL    BUN 12 8 - 23 mg/dL    Creatinine 0.9 0.7 - 1.2 mg/dL    Est, Glom Filt Rate >90 >60    Calcium  8.9 8.8 - 10.2 mg/dL   CBC  with Auto Differential    Collection Time: 01/15/24  3:05 AM   Result Value Ref Range    WBC 9.3 4.8 - 10.8 K/uL    RBC 4.22 (L) 4.70 - 6.10 M/uL    Hemoglobin 13.4 (L) 14.0 - 18.0 g/dL    Hematocrit 60.8 (L) 42.0 - 52.0 %    MCV 92.7 80.0 - 94.0 fL    MCH 31.8 (H) 27.0 - 31.0 pg    MCHC 34.3 33.0 - 37.0 g/dL    RDW 86.9 88.4 - 85.4 %    Platelets 147 130 - 400 K/uL    MPV 9.3 (L) 9.4 - 12.4 fL    Neutrophils % 88.0 (H) 50.0 - 65.0 %    Lymphocytes % 5.9 (L) 20.0 - 40.0 %    Monocytes % 5.3 0.0 - 10.0 %    Eosinophils % 0.2 0.0 - 5.0 %    Basophils % 0.4 0.0 - 1.0 %    Neutrophils Absolute 8.1 (H) 1.5 - 7.5 K/uL    Immature Granulocytes # 0.0 K/uL    Lymphocytes Absolute 0.6 (L) 1.1 - 4.5 K/uL    Monocytes Absolute 0.50 0.00 - 0.90 K/uL    Eosinophils Absolute 0.00 0.00 - 0.60 K/uL    Basophils Absolute 0.00 0.00 - 0.20 K/uL             Current Facility-Administered  Medications:     hydrALAZINE  (APRESOLINE ) injection 10 mg, 10 mg, IntraVENous, Q4H PRN, Dozier Gilmore DASEN, MD, 10 mg at 01/14/24 2227    morphine  (PF) injection 1 mg, 1 mg, IntraVENous, Q4H PRN, Dozier, Gilmore DASEN, MD    morphine  (PF) injection 2 mg, 2 mg, IntraVENous, Q4H PRN, Dozier Gilmore DASEN, MD, 2 mg at 01/14/24 1138    morphine  sulfate (PF) injection 4 mg, 4 mg, IntraVENous, Q4H PRN, Dozier, Gilmore DASEN, MD    oxyCODONE  (ROXICODONE ) immediate release tablet 2.5 mg, 2.5 mg, Oral, Q4H PRN, Dozier, Gilmore DASEN, MD    naloxone  (NARCAN ) injection 0.4 mg, 0.4 mg, IntraVENous, PRN, DeWeese, Gilmore DASEN, MD    ondansetron  (ZOFRAN ) injection 4 mg, 4 mg, IntraVENous, Q6H PRN, Dozier, Gilmore DASEN, MD    promethazine  (PHENERGAN ) injection 12.5 mg, 12.5 mg, IntraMUSCular, Q6H PRN, Dozier, Gilmore DASEN, MD    promethazine  (PHENERGAN ) tablet 12.5 mg, 12.5 mg, Oral, Q6H PRN, Dozier, Gilmore DASEN, MD    vitamin B-12 (CYANOCOBALAMIN ) tablet 2,000 mcg, 2,000 mcg, Oral, Daily, DeWeese, Gilmore DASEN, MD, 2,000 mcg at 01/15/24 9073    disulfiram  (ANTABUSE )  tablet 500 mg, 500 mg, Oral, Daily, DeWeese, Frederick T, MD, 500 mg at 01/14/24 2123    donepezil  (ARICEPT ) tablet 10 mg, 10 mg, Oral, Nightly, DeWeese, Gilmore DASEN, MD, 10 mg at 01/14/24 2228    Magnesium  Gluconate tablet 500 mg, 500 mg, Oral, Daily, DeWeese, Frederick T, MD, 500 mg at 01/15/24 9074    memantine  (NAMENDA ) tablet 10 mg, 10 mg, Oral, BID, DeWeese, Frederick T, MD, 10 mg at 01/15/24 9074    therapeutic multivitamin-minerals 1 tablet, 1 tablet, Oral, Daily, Dozier Gilmore DASEN, MD, 1 tablet at 01/15/24 9073    atorvastatin  (LIPITOR ) tablet 10 mg, 10 mg, Oral, Daily, DeWeese, Frederick T, MD, 10 mg at 01/15/24 9074    vitamin B-6 (PYRIDOXINE ) tablet 100 mg, 100 mg, Oral, Daily, DeWeese, Frederick T, MD, 100 mg at 01/15/24 9073    vitamin D  (CHOLECALCIFEROL ) capsule 5,000 Units, 5,000 Units, Oral, Daily, DeWeese, Frederick T, MD, 5,000 Units at 01/15/24 9073    vitamin E  capsule 800 Units, 800 Units, Oral, Daily, DeWeese, Frederick T, MD, 800 Units at 01/15/24 9073    zinc  sulfate (ZINCATE) 220 mg capsule - elemental zinc  50 mg, 50 mg, Oral, Daily, Dozier Gilmore DASEN, MD, 50 mg at 01/15/24 9073    sodium chloride  flush 0.9 % injection 5-40 mL, 5-40 mL, IntraVENous, 2 times per day, Dozier Gilmore T, MD, 10 mL at 01/15/24 0900    sodium chloride  flush 0.9 % injection 5-40 mL, 5-40 mL, IntraVENous, PRN, Dozier, Gilmore DASEN, MD    0.9 % sodium chloride  infusion, , IntraVENous, PRN, DeWeese, Gilmore DASEN, MD    potassium chloride  (KLOR-CON  M) extended release tablet 40 mEq, 40 mEq, Oral, PRN **OR** potassium bicarb-citric acid  (EFFER-K) effervescent tablet 40 mEq, 40 mEq, Oral, PRN **OR** potassium chloride  10 mEq/100 mL IVPB (Peripheral Line), 10 mEq, IntraVENous, PRN, Dozier, Gilmore DASEN, MD    magnesium  sulfate 2000 mg in 50 mL IVPB premix, 2,000 mg, IntraVENous, PRN, DeWeese, Gilmore DASEN, MD    sodium phosphate  13.26 mmol in sodium chloride  0.9 % 250 mL IVPB, 0.16 mmol/kg, IntraVENous, PRN  **OR** sodium phosphate  26.49 mmol in sodium chloride  0.9 % 250 mL IVPB, 0.32 mmol/kg, IntraVENous, PRN, DeWeese, Frederick T, MD    metaxalone  (SKELAXIN ) tablet 800 mg, 800 mg, Oral, Q8H PRN, Dozier, Gilmore DASEN, MD    polyethylene glycol (GLYCOLAX ) packet 17  g, 17 g, Oral, BID PRN, Dozier, Gilmore DASEN, MD    melatonin disintegrating tablet 5 mg, 5 mg, Oral, Nightly PRN, Dozier, Gilmore DASEN, MD    calcium  carbonate (TUMS) chewable tablet 1,000 mg, 1,000 mg, Oral, TID PRN, Dozier, Gilmore DASEN, MD    sodium chloride  flush 0.9 % injection 5-40 mL, 5-40 mL, IntraVENous, 2 times per day, Dozier Gilmore T, MD, 10 mL at 01/15/24 0926    sodium chloride  flush 0.9 % injection 5-40 mL, 5-40 mL, IntraVENous, PRN, Dozier, Gilmore DASEN, MD    0.9 % sodium chloride  infusion, , IntraVENous, PRN, DeWeese, Gilmore DASEN, MD    ceFAZolin  (ANCEF ) 2,000 mg in sterile water  20 mL IV syringe, 2,000 mg, IntraVENous, Q8H, DeWeese, Gilmore DASEN, MD, 2,000 mg at 01/15/24 9076    acetaminophen  (TYLENOL ) tablet 650 mg, 650 mg, Oral, Q6H, DeWeese, Frederick T, MD, 650 mg at 01/15/24 9074    meloxicam  (MOBIC ) tablet 3.75 mg, 3.75 mg, Oral, Daily, DeWeese, Frederick T, MD, 3.75 mg at 01/15/24 9073    oxyCODONE  (ROXICODONE ) immediate release tablet 5 mg, 5 mg, Oral, Q4H PRN **OR** oxyCODONE  HCl (OXY-IR) immediate release tablet 10 mg, 10 mg, Oral, Q4H PRN, DeWeese, Gilmore DASEN, MD    morphine  (PF) injection 2 mg, 2 mg, IntraVENous, Q2H PRN **OR** morphine  sulfate (PF) injection 4 mg, 4 mg, IntraVENous, Q2H PRN, DeWeese, Gilmore DASEN, MD    aspirin  EC tablet 325 mg, 325 mg, Oral, BID, Dozier Gilmore DASEN, MD, 325 mg at 01/15/24 9073      Imaging:  XR HIP 2-3 VW W PELVIS RIGHT  Result Date: 01/14/2024   1. Interval right total hip arthroplasty with intact hardware in satisfactory position. 2. Foley catheter. 3. Mild sacroiliac and left femoral osteoarthritis.     .   ______________________________________ Electronically signed by: LONNI BERKSHIRE D.O. Date:     01/14/2024 Time:    18:09     CT PELVIS WO CONTRAST Additional Contrast? None  Result Date: 01/14/2024  Right femoral neck fracture  All CT scans are performed using dose optimization techniques as appropriate to the performed exam and include at least one of the following: Automated exposure control, adjustment of the mA and/or kV according to size, and the use of iterative reconstruction technique.  ______________________________________ Electronically signed by: KANDI BEND M.D. Date:     01/14/2024 Time:    01:34     XR HIP 2-3 VW W PELVIS RIGHT  Result Date: 01/14/2024   Subcapital femoral neck fracture with lateral impaction.    ______________________________________ Electronically signed by: GUSTAVO HIGHTOWER M.D. Date:     01/14/2024 Time:    01:22     XR CHEST PORTABLE  Result Date: 01/14/2024   1. No acute findings.      ______________________________________ Electronically signed by: GUSTAVO HIGHTOWER M.D. Date:     01/14/2024 Time:    01:22     CT HEAD WO CONTRAST  Result Date: 01/14/2024  1. No acute intracranial process. 2. Stable atrophy and chronic small vessel ischemic disease  All CT scans are performed using dose optimization techniques as appropriate to the performed exam and include at least one of the following: Automated exposure control, adjustment of the mA and/or kV according to size, and the use of iterative reconstruction technique.  ______________________________________ Electronically signed by: KANDI BEND M.D. Date:     01/14/2024 Time:    00:37          Assessment/Plan  Principal Problem:    Closed  fracture of right femur, initial encounter (HCC)  Active Problems:    Recurrent falls    Dementia (HCC)    Delirium superimposed on dementia  Resolved Problems:    * No resolved hospital problems. *     Right femoral neck fracture  -- Orthopedics consulted  -- S/p right THA on 01/15/24  -- Pain control, bowel regimen  -- DVT prophylaxis  -- PT/OT  -- Case management  involved with discharge planning    Dementia with behavioral disturbance  -- Cog eval ordered  -- Delirium precautions  -- Continue Namenda  and Donepezil   -- Add Seroquel  25 mg QHS      DVT prophylaxis: ASA 325 mg BID    DISPOSITION:  TBD, needs SNF placement    Full Code No additional code details         Complexity of medical decision making: Moderate  Risk of complications and/or morbidity/mortality: Moderate      Velma Archer, MD 01/15/2024 12:24 PM

## 2024-01-15 NOTE — Discharge Instructions (Signed)
 Up with walker for short distances

## 2024-01-15 NOTE — Progress Notes (Signed)
 Occupational Therapy Initial Assessment  Date: 01/15/2024   Patient Name: Raymond Skinner  MRN: [id number]     DOB: 06-05-1955    Date of Service: 01/15/2024    Discharge Recommendations:  Patient would benefit from continued therapy after discharge, 24 hour supervision or assist       Assessment   Performance deficits / Impairments: Decreased functional mobility ;Decreased ADL status;Decreased strength;Decreased safe awareness;Decreased balance;Decreased posture;Decreased coordination;Decreased endurance;Decreased fine motor control;Decreased cognition  Assessment: Evaluation completed and tx initiated.  The patient would benefit from further skilled therapy to upgrade safety and functional independence. Pt was limited by dementia throughtout session. Pt displayed ataxic movement with B UE when initiating a task. Pt was unable to maintain grasp on railing during bed mobility. Pt required intermittent support for sitting balance fluctuating from CGA/Max A. Pt was unable to follow commands or perform purposeful movements. Pt was able to attempt to stand and clear bottom from bed, but was not able to fully stand at this time. Will continue to work towards functional mobility as dementia allows. Pt required max Ax2 for back to bed and was set up with breakfast in front of him. With visual/tactile cues and demo, pt was unable to feed himself or grasp utensils. Pt was unable to grasp water  due to impaired coordination and cognition. Pt was fixated on if his wife was in the hallway. Pt would require 24/7 care at this time.  Treatment Diagnosis: R THA  Prognosis: Fair  Decision Making: Medium Complexity  REQUIRES OT FOLLOW-UP: Yes  Activity Tolerance  Activity Tolerance: Treatment limited secondary to decreased cognition              Patient Diagnosis(es): The primary encounter diagnosis was Delirium superimposed on dementia. Diagnoses of Fall from standing, initial encounter and Closed subcapital fracture of right femur,  initial encounter [facility]) were also pertinent to this visit.    Past Medical History:   Past Medical History:   Diagnosis Date    Dementia (HCC)     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        Past Surgical History: History reviewed. No pertinent surgical history.    Treatment Diagnosis: R THA      Restrictions  Restrictions/Precautions  Restrictions/Precautions: Weight Bearing, Fall Risk  Lower Extremity Weight Bearing Restrictions  Right Lower Extremity Weight Bearing: Weight Bearing As Tolerated    Subjective      Pain Assessment  Pain Assessment: 0-10  Pain Level: 5  Patient's Stated Pain Goal: 0 - No pain  Vital Signs  Temp: 98.5 F (36.9 C)  Temp Source: Temporal  Pulse: 94  Heart Rate Source: Monitor  Respirations: 14  BP: 131/82  MAP (Calculated): 98  MAP (mmHg): 93  BP Location: Left upper arm  BP Method: Automatic  Patient Position: Supine  Oxygen Therapy  SpO2: 99 %  O2 Device: None (Room air)    Social/Functional History  Social/Functional History  Additional Comments: PLOF UNKNOWN DUE TO DEMENTIA     Objective              Observation/Palpation  Observation: FOLEY CATH. SET UP BFAST FOR pt BUT HE WAS UNABLE TO GRASP AND USE A UTENSIL. ALSO UNABLE TO  HOLD A CUP TO TAKE A DRINK. RN NOTIFIED pt WOULD HAVE TO BE FED     ADL  Feeding: Maximum assistance;Verbal cueing;Setup  Grooming: Maximum assistance;Setup;Verbal cueing  UE Bathing: Moderate assistance;Maximum assistance;Verbal cueing  LE Bathing: Maximum  assistance;Dependent/Total;Verbal cueing  UE Dressing: Moderate assistance;Maximum assistance;Verbal cueing  LE Dressing: Maximum assistance;Dependent/Total;Verbal cueing  Putting On/Taking Off Footwear: Maximum assistance;Dependent/Total;Verbal cueing  Toileting: Dependent/Total        Bed mobility  Supine to Sit: Substantial/Maximal assistance;2 Person assistance  Sit to Supine: Substantial/Maximal assistance;2 Person assistance  Scooting: Partial/Moderate assistance;2 Person assistance  Bed Mobility  Comments: Pt WAS ABLE TO USE L LE TO ASSIST WITH SCOOTING TOWARD HOB        Cognition  Overall Cognitive Status: Exceptions  Arousal/Alertness: Appears intact  Following Commands: Inconsistently follows commands  Attention Span: Difficulty attending to directions;Difficulty dividing attention  Memory: Impaired  Safety Judgement: Impaired  Problem Solving: Impaired  Insights: Not aware of deficits  Initiation: Requires cues for all  Sequencing: Requires cues for all               Gross Assessment  AROM: Generally decreased, functional  Strength: Generally decreased, functional  Coordination: Grossly decreased, non-functional                       Plan   Occupational Therapy Plan  Times Per Week: 3-5     Goals  Short Term Goals  Short Term Goal 1: Perform transfers with moderate assist  Short Term Goal 2: Perform LB dressing with moderate assist  Short Term Goal 3: Perform toileting with moderate assist  Short Term Goal 4: Demo ability to sustain functional activity EOB in unsupported sitting with supervision for 5 mins  Short Term Goal 5: Independent with therapeutic activity recommendations  Long Term Goals  Long Term Goal 1: Upgrade as tolerated        Tx initiated: Pt required max Ax2 for bed mobility. Pt was limited due to dementia. Pt displayed generalized weakness and impaired B UE coordination needed for ADL tasks. (15 minutes)  Rosina Courts, OTR/L  Electronically signed by Rosina Courts OTR/L on 01/15/2024 at 11:52 AM.

## 2024-01-16 LAB — CBC WITH AUTO DIFFERENTIAL
Basophils %: 0.4 % (ref 0.0–1.0)
Basophils Absolute: 0 K/uL (ref 0.00–0.20)
Eosinophils %: 1.7 % (ref 0.0–5.0)
Eosinophils Absolute: 0.1 K/uL (ref 0.00–0.60)
Hematocrit: 35.3 % — ABNORMAL LOW (ref 42.0–52.0)
Hemoglobin: 11.9 g/dL — ABNORMAL LOW (ref 14.0–18.0)
Immature Granulocytes #: 0 K/uL
Lymphocytes %: 18.6 % — ABNORMAL LOW (ref 20.0–40.0)
Lymphocytes Absolute: 1.5 K/uL (ref 1.1–4.5)
MCH: 31.1 pg — ABNORMAL HIGH (ref 27.0–31.0)
MCHC: 33.7 g/dL (ref 33.0–37.0)
MCV: 92.2 fL (ref 80.0–94.0)
MPV: 10.3 fL (ref 9.4–12.4)
Monocytes %: 7.8 % (ref 0.0–10.0)
Monocytes Absolute: 0.6 K/uL (ref 0.00–0.90)
Neutrophils %: 71.2 % — ABNORMAL HIGH (ref 50.0–65.0)
Neutrophils Absolute: 5.6 K/uL (ref 1.5–7.5)
Platelets: 137 K/uL (ref 130–400)
RBC: 3.83 M/uL — ABNORMAL LOW (ref 4.70–6.10)
RDW: 12.8 % (ref 11.5–14.5)
WBC: 7.8 K/uL (ref 4.8–10.8)

## 2024-01-16 LAB — BASIC METABOLIC PANEL W/ REFLEX TO MG FOR LOW K
Anion Gap: 10 mmol/L (ref 8–16)
BUN: 18 mg/dL (ref 8–23)
CO2: 25 mmol/L (ref 22–29)
Calcium: 9 mg/dL (ref 8.8–10.2)
Chloride: 106 mmol/L (ref 98–107)
Creatinine: 0.9 mg/dL (ref 0.7–1.2)
Est, Glom Filt Rate: >90 (ref >60)
Glucose: 155 mg/dL — ABNORMAL HIGH (ref 70–99)
Potassium reflex Magnesium: 3.7 mmol/L (ref 3.5–5.0)
Sodium: 141 mmol/L (ref 136–145)

## 2024-01-16 MED FILL — CEFAZOLIN SODIUM 1 G IJ SOLR: 1 g | INTRAMUSCULAR | Qty: 2000

## 2024-01-16 MED FILL — ASPIRIN 325 MG PO TBEC: 325 mg | ORAL | Qty: 1

## 2024-01-16 MED FILL — ACETAMINOPHEN 325 MG PO TABS: 325 mg | ORAL | Qty: 2

## 2024-01-16 MED FILL — MEMANTINE HCL 5 MG PO TABS: 5 mg | ORAL | Qty: 2

## 2024-01-16 MED FILL — ZINC SULFATE 220 (50 ZN) MG PO CAPS: 220 (50 Zn) MG | ORAL | Qty: 1

## 2024-01-16 MED FILL — MAG-G 500 (27 MG) MG PO TABS: 500 (27 Mg) MG | ORAL | Qty: 1

## 2024-01-16 MED FILL — MORPHINE SULFATE 2 MG/ML IJ SOLN: 2 mg/mL | INTRAMUSCULAR | Qty: 1

## 2024-01-16 MED FILL — DONEPEZIL HCL 10 MG PO TABS: 10 mg | ORAL | Qty: 1

## 2024-01-16 MED FILL — DISULFIRAM 250 MG PO TABS: 250 mg | ORAL | Qty: 2

## 2024-01-16 MED FILL — ATORVASTATIN CALCIUM 10 MG PO TABS: 10 mg | ORAL | Qty: 1

## 2024-01-16 MED FILL — THEREMS-M PO TABS: ORAL | Qty: 1

## 2024-01-16 MED FILL — OXYCODONE HCL 5 MG PO TABS: 5 mg | ORAL | Qty: 1

## 2024-01-16 MED FILL — QUETIAPINE FUMARATE 25 MG PO TABS: 25 mg | ORAL | Qty: 1

## 2024-01-16 MED FILL — MELOXICAM 7.5 MG PO TABS: 7.5 mg | ORAL | Qty: 1

## 2024-01-16 MED FILL — VITAMIN D3 ULTRA STRENGTH 125 MCG (5000 UT) PO CAPS: 125 MCG (5000 UT) | ORAL | Qty: 1

## 2024-01-16 MED FILL — VITAMIN E 180 MG (400 UNIT) PO CAPS: 180 MG (400 UNIT) | ORAL | Qty: 2

## 2024-01-16 MED FILL — MELATONIN 5 MG PO TBDP: 5 mg | ORAL | Qty: 1

## 2024-01-16 MED FILL — VITAMIN B-12 500 MCG PO TABS: 500 ug | ORAL | Qty: 4

## 2024-01-16 MED FILL — VITAMIN B-6 100 MG PO TABS: 100 mg | ORAL | Qty: 1 | Fill #0

## 2024-01-16 NOTE — Progress Notes (Signed)
 Daily Progress Note    Date:01/16/2024  Patient: Raymond Skinner  DOB: April 22, 1956  FMW:[id number]  CODE:Full Code No additional code details  ERE:Gnwzd, Dorothyann SAILOR, APRN    Admit Date: 01/13/2024 11:38 PM   LOS: 2 days     Chief Complaint   Patient presents with    Altered Mental Status     Pt family reports that pt has had increased AMS. Pt has hx of Dementia, family at home cannot take care of patient looking for placement.          Subjective: POD 2 s/p right THA. Pt's mentation is improved compared to yesterday, but he remains disoriented.         Hospital Summary: 68 yo M with dementia who presented to [facility] ED with worsening confusion and recurrent falls.  He was found to have a right femoral neck fracture and admitted to the hospitalist service for further management.  No source of infection identified.     Orthopedics was consulted and pt underwent right THA on 01/14/24.  He remained disoriented post-operatively.   Cog eval pending as family plans to pursue guardianship.         Review of Systems   Unable to perform ROS: Mental status change       Objective:      Vital signs in last 24 hours:  Patient Vitals for the past 24 hrs:   BP Temp Temp src Pulse Resp SpO2   01/16/24 0714 132/74 97.5 F (36.4 C) Temporal 82 16 99 %   01/16/24 0412 127/75 97.3 F (36.3 C) Temporal 69 18 94 %   01/16/24 0014 116/65 97.3 F (36.3 C) Temporal 85 16 92 %   01/15/24 1949 (!) 108/91 98.6 F (37 C) Temporal 95 18 95 %   01/15/24 1700 117/61 98 F (36.7 C) Temporal 81 16 100 %   01/15/24 1041 131/82 98.5 F (36.9 C) Temporal 94 14 99 %       I/O last 3 completed shifts:  In: -   Out: 1000 [Urine:1000]  No intake/output data recorded.    Physical Exam  Constitutional:       General: He is not in acute distress.     Appearance: He is not toxic-appearing.   Cardiovascular:      Rate and Rhythm: Normal rate and regular rhythm.      Pulses: Normal pulses.      Heart sounds: Normal heart sounds.   Pulmonary:      Effort: Pulmonary  effort is normal. No respiratory distress.      Breath sounds: Normal breath sounds.   Abdominal:      General: Bowel sounds are normal. There is no distension.      Palpations: Abdomen is soft.      Tenderness: There is no abdominal tenderness.             Lab Review   Recent Results (from the past 24 hours)   CBC with Auto Differential    Collection Time: 01/16/24  1:19 AM   Result Value Ref Range    WBC 7.8 4.8 - 10.8 K/uL    RBC 3.83 (L) 4.70 - 6.10 M/uL    Hemoglobin 11.9 (L) 14.0 - 18.0 g/dL    Hematocrit 64.6 (L) 42.0 - 52.0 %    MCV 92.2 80.0 - 94.0 fL    MCH 31.1 (H) 27.0 - 31.0 pg    MCHC 33.7 33.0 - 37.0 g/dL  RDW 12.8 11.5 - 14.5 %    Platelets 137 130 - 400 K/uL    MPV 10.3 9.4 - 12.4 fL    Neutrophils % 71.2 (H) 50.0 - 65.0 %    Lymphocytes % 18.6 (L) 20.0 - 40.0 %    Monocytes % 7.8 0.0 - 10.0 %    Eosinophils % 1.7 0.0 - 5.0 %    Basophils % 0.4 0.0 - 1.0 %    Neutrophils Absolute 5.6 1.5 - 7.5 K/uL    Immature Granulocytes # 0.0 K/uL    Lymphocytes Absolute 1.5 1.1 - 4.5 K/uL    Monocytes Absolute 0.60 0.00 - 0.90 K/uL    Eosinophils Absolute 0.10 0.00 - 0.60 K/uL    Basophils Absolute 0.00 0.00 - 0.20 K/uL   Basic Metabolic Panel w/ Reflex to MG    Collection Time: 01/16/24  1:19 AM   Result Value Ref Range    Sodium 141 136 - 145 mmol/L    Potassium reflex Magnesium  3.7 3.5 - 5.0 mmol/L    Chloride 106 98 - 107 mmol/L    CO2 25 22 - 29 mmol/L    Anion Gap 10 8 - 16 mmol/L    Glucose 155 (H) 70 - 99 mg/dL    BUN 18 8 - 23 mg/dL    Creatinine 0.9 0.7 - 1.2 mg/dL    Est, Glom Filt Rate >90 >60    Calcium  9.0 8.8 - 10.2 mg/dL             Current Facility-Administered Medications:     QUEtiapine  (SEROQUEL ) tablet 25 mg, 25 mg, Oral, Nightly, Floretta Bring, MD, 25 mg at 01/15/24 2221    hydrALAZINE  (APRESOLINE ) injection 10 mg, 10 mg, IntraVENous, Q4H PRN, Dozier Gilmore DASEN, MD, 10 mg at 01/14/24 2227    morphine  (PF) injection 1 mg, 1 mg, IntraVENous, Q4H PRN, Dozier Gilmore DASEN, MD    morphine   (PF) injection 2 mg, 2 mg, IntraVENous, Q4H PRN, Dozier Gilmore DASEN, MD, 2 mg at 01/14/24 1138    morphine  sulfate (PF) injection 4 mg, 4 mg, IntraVENous, Q4H PRN, Dozier, Gilmore DASEN, MD    oxyCODONE  (ROXICODONE ) immediate release tablet 2.5 mg, 2.5 mg, Oral, Q4H PRN, Dozier, Gilmore DASEN, MD    naloxone  (NARCAN ) injection 0.4 mg, 0.4 mg, IntraVENous, PRN, Dozier, Gilmore DASEN, MD    ondansetron  (ZOFRAN ) injection 4 mg, 4 mg, IntraVENous, Q6H PRN, Dozier, Gilmore DASEN, MD    promethazine  (PHENERGAN ) injection 12.5 mg, 12.5 mg, IntraMUSCular, Q6H PRN, Dozier, Gilmore DASEN, MD    promethazine  (PHENERGAN ) tablet 12.5 mg, 12.5 mg, Oral, Q6H PRN, Dozier Gilmore DASEN, MD    vitamin B-12 (CYANOCOBALAMIN ) tablet 2,000 mcg, 2,000 mcg, Oral, Daily, DeWeese, Frederick T, MD, 2,000 mcg at 01/16/24 0813    disulfiram  (ANTABUSE ) tablet 500 mg, 500 mg, Oral, Daily, Dozier, Frederick T, MD, 500 mg at 01/15/24 2220    donepezil  (ARICEPT ) tablet 10 mg, 10 mg, Oral, Nightly, DeWeese, Frederick T, MD, 10 mg at 01/15/24 2221    Magnesium  Gluconate tablet 500 mg, 500 mg, Oral, Daily, Dozier, Frederick T, MD, 500 mg at 01/16/24 9184    memantine  (NAMENDA ) tablet 10 mg, 10 mg, Oral, BID, DeWeese, Frederick T, MD, 10 mg at 01/16/24 0815    therapeutic multivitamin-minerals 1 tablet, 1 tablet, Oral, Daily, DeWeese, Frederick T, MD, 1 tablet at 01/16/24 9184    atorvastatin  (LIPITOR ) tablet 10 mg, 10 mg, Oral, Daily, DeWeese, Frederick T, MD, 10 mg at 01/16/24  0815    vitamin B-6 (PYRIDOXINE ) tablet 100 mg, 100 mg, Oral, Daily, Dozier, Gilmore DASEN, MD, 100 mg at 01/16/24 9185    vitamin D  (CHOLECALCIFEROL ) capsule 5,000 Units, 5,000 Units, Oral, Daily, DeWeese, Frederick T, MD, 5,000 Units at 01/16/24 0815    vitamin E  capsule 800 Units, 800 Units, Oral, Daily, DeWeese, Frederick T, MD, 800 Units at 01/16/24 9187    zinc  sulfate (ZINCATE) 220 mg capsule - elemental zinc  50 mg, 50 mg, Oral, Daily, Dozier Gilmore DASEN, MD, 50 mg at  01/16/24 9185    sodium chloride  flush 0.9 % injection 5-40 mL, 5-40 mL, IntraVENous, 2 times per day, Dozier Gilmore T, MD, 10 mL at 01/16/24 0815    sodium chloride  flush 0.9 % injection 5-40 mL, 5-40 mL, IntraVENous, PRN, Dozier, Gilmore DASEN, MD    0.9 % sodium chloride  infusion, , IntraVENous, PRN, DeWeese, Gilmore DASEN, MD    potassium chloride  (KLOR-CON  M) extended release tablet 40 mEq, 40 mEq, Oral, PRN **OR** potassium bicarb-citric acid  (EFFER-K) effervescent tablet 40 mEq, 40 mEq, Oral, PRN **OR** potassium chloride  10 mEq/100 mL IVPB (Peripheral Line), 10 mEq, IntraVENous, PRN, Dozier, Gilmore DASEN, MD    magnesium  sulfate 2000 mg in 50 mL IVPB premix, 2,000 mg, IntraVENous, PRN, DeWeese, Gilmore DASEN, MD    sodium phosphate  13.26 mmol in sodium chloride  0.9 % 250 mL IVPB, 0.16 mmol/kg, IntraVENous, PRN **OR** sodium phosphate  26.49 mmol in sodium chloride  0.9 % 250 mL IVPB, 0.32 mmol/kg, IntraVENous, PRN, DeWeese, Gilmore DASEN, MD    metaxalone  (SKELAXIN ) tablet 800 mg, 800 mg, Oral, Q8H PRN, Dozier, Gilmore DASEN, MD    polyethylene glycol (GLYCOLAX ) packet 17 g, 17 g, Oral, BID PRN, Dozier, Gilmore DASEN, MD    melatonin disintegrating tablet 5 mg, 5 mg, Oral, Nightly PRN, Dozier Gilmore DASEN, MD, 5 mg at 01/15/24 2221    calcium  carbonate (TUMS) chewable tablet 1,000 mg, 1,000 mg, Oral, TID PRN, Dozier Gilmore DASEN, MD    sodium chloride  flush 0.9 % injection 5-40 mL, 5-40 mL, IntraVENous, 2 times per day, Dozier Gilmore T, MD, 10 mL at 01/16/24 0815    sodium chloride  flush 0.9 % injection 5-40 mL, 5-40 mL, IntraVENous, PRN, DeWeese, Gilmore DASEN, MD    0.9 % sodium chloride  infusion, , IntraVENous, PRN, DeWeese, Gilmore DASEN, MD    acetaminophen  (TYLENOL ) tablet 650 mg, 650 mg, Oral, Q6H, DeWeese, Frederick T, MD, 650 mg at 01/16/24 9186    oxyCODONE  (ROXICODONE ) immediate release tablet 5 mg, 5 mg, Oral, Q4H PRN, 5 mg at 01/15/24 2220 **OR** oxyCODONE  HCl (OXY-IR) immediate release tablet 10  mg, 10 mg, Oral, Q4H PRN, Dozier Gilmore DASEN, MD    morphine  (PF) injection 2 mg, 2 mg, IntraVENous, Q2H PRN, 2 mg at 01/16/24 0135 **OR** morphine  sulfate (PF) injection 4 mg, 4 mg, IntraVENous, Q2H PRN, Dozier, Gilmore DASEN, MD    aspirin  EC tablet 325 mg, 325 mg, Oral, BID, Dozier Gilmore DASEN, MD, 325 mg at 01/16/24 0813      Imaging:  XR HIP 2-3 VW W PELVIS RIGHT  Result Date: 01/14/2024   1. Interval right total hip arthroplasty with intact hardware in satisfactory position. 2. Foley catheter. 3. Mild sacroiliac and left femoral osteoarthritis.     .   ______________________________________ Electronically signed by: LONNI BERKSHIRE D.O. Date:     01/14/2024 Time:    18:09     CT PELVIS WO CONTRAST Additional Contrast? None  Result Date: 01/14/2024  Right femoral neck fracture  All CT scans are performed using dose optimization techniques as appropriate to the performed exam and include at least one of the following: Automated exposure control, adjustment of the mA and/or kV according to size, and the use of iterative reconstruction technique.  ______________________________________ Electronically signed by: KANDI BEND M.D. Date:     01/14/2024 Time:    01:34     XR HIP 2-3 VW W PELVIS RIGHT  Result Date: 01/14/2024   Subcapital femoral neck fracture with lateral impaction.    ______________________________________ Electronically signed by: GUSTAVO HIGHTOWER M.D. Date:     01/14/2024 Time:    01:22     XR CHEST PORTABLE  Result Date: 01/14/2024   1. No acute findings.      ______________________________________ Electronically signed by: GUSTAVO HIGHTOWER M.D. Date:     01/14/2024 Time:    01:22     CT HEAD WO CONTRAST  Result Date: 01/14/2024  1. No acute intracranial process. 2. Stable atrophy and chronic small vessel ischemic disease  All CT scans are performed using dose optimization techniques as appropriate to the performed exam and include at least one of the following: Automated exposure control, adjustment  of the mA and/or kV according to size, and the use of iterative reconstruction technique.  ______________________________________ Electronically signed by: KANDI BEND M.D. Date:     01/14/2024 Time:    00:37          Assessment/Plan  Principal Problem:    Closed fracture of right femur, initial encounter [facility])  Active Problems:    Recurrent falls    Dementia (HCC)    Delirium superimposed on dementia  Resolved Problems:    * No resolved hospital problems. *     Right femoral neck fracture  -- Orthopedics consulted  -- S/p right THA on 01/15/24  -- Pain control, bowel regimen  -- DVT prophylaxis  -- PT/OT  -- Case management involved with discharge planning    Dementia with behavioral disturbance  -- Cog eval ordered  -- Delirium precautions  -- Continue Namenda  and Donepezil   -- Continue Seroquel  25 mg QHS      DVT prophylaxis: ASA 325 mg BID    DISPOSITION:  TBD, needs SNF placement    Full Code No additional code details         Complexity of medical decision making: Moderate  Risk of complications and/or morbidity/mortality: Moderate      Velma Archer, MD 01/16/2024 10:21 AM

## 2024-01-16 NOTE — Plan of Care (Signed)
 Problem: Discharge Planning  Goal: Discharge to home or other facility with appropriate resources  Outcome: Progressing     Problem: Pain  Goal: Verbalizes/displays adequate comfort level or baseline comfort level  Outcome: Progressing     Problem: Confusion  Goal: Confusion, delirium, dementia, or psychosis is improved or at baseline  Description: INTERVENTIONS:  1. Assess for possible contributors to thought disturbance, including medications, impaired vision or hearing, underlying metabolic abnormalities, dehydration, psychiatric diagnoses, and notify attending LIP  2. Institute high risk fall precautions, as indicated  3. Provide frequent short contacts to provide reality reorientation, refocusing and direction  4. Decrease environmental stimuli, including noise as appropriate  5. Monitor and intervene to maintain adequate nutrition, hydration, elimination, sleep and activity  6. If unable to ensure safety without constant attention obtain sitter and review sitter guidelines with assigned personnel  7. Initiate Psychosocial CNS and Spiritual Care consult, as indicated  Outcome: Progressing  Flowsheets (Taken 01/16/2024 [phone])  Effect of thought disturbance (confusion, delirium, dementia, or psychosis) are managed with adequate functional status:   Assess for contributors to thought disturbance, including medications, impaired vision or hearing, underlying metabolic abnormalities, dehydration, psychiatric diagnoses, notify LIP   Institute high risk fall precautions, as indicated   Provide frequent short contacts to provide reality reorientation, refocusing and direction   Decrease environmental stimuli, including noise as appropriate   Monitor and intervene to maintain adequate nutrition, hydration, elimination, sleep and activity   If unable to ensure safety without constant attention obtain sitter and review sitter guidelines with assigned personnel     Problem: ABCDS Injury Assessment  Goal: Absence of  physical injury  Outcome: Progressing     Problem: Risk for Elopement  Goal: Patient will not exit the unit/facility without proper excort  Outcome: Progressing  Flowsheets  Taken 01/16/2024 0835 by Johnnye Setter, RN  Nursing Interventions for Elopement Risk:   Reduce environmental triggers   Shoes and clothing collected and placed in gown attire  Taken 01/16/2024 0400 by Laurier Delon HERO, LPN  Nursing Interventions for Elopement Risk:   Reduce environmental triggers   Shoes and clothing collected and placed in gown attire  Taken 01/16/2024 0000 by Laurier Delon HERO, LPN  Nursing Interventions for Elopement Risk:   Reduce environmental triggers   Shoes and clothing collected and placed in gown attire     Problem: Safety - Adult  Goal: Free from fall injury  Outcome: Progressing

## 2024-01-16 NOTE — Progress Notes (Signed)
 Subjective:     Post-Operative Day: 2 No complaints    Objective:   Patient is awake today but is not able to communicate due to dementia  Patient Vitals for the past 24 hrs:   BP Temp Temp src Pulse Resp SpO2   01/16/24 0714 132/74 97.5 F (36.4 C) Temporal 82 16 99 %   01/16/24 0412 127/75 97.3 F (36.3 C) Temporal 69 18 94 %   01/16/24 0014 116/65 97.3 F (36.3 C) Temporal 85 16 92 %   01/15/24 1949 (!) 108/91 98.6 F (37 C) Temporal 95 18 95 %   01/15/24 1700 117/61 98 F (36.7 C) Temporal 81 16 100 %   01/15/24 1041 131/82 98.5 F (36.9 C) Temporal 94 14 99 %   01/15/24 0927 -- -- -- -- -- 93 %       General: Alert    Wound: Clean dry intact.  Moderate swelling   Neurovascular: Exam normal he can wiggle his toes.  His foot is warm with good dorsalis pedis pulse   DVT Exam: negative         Data Review:  Recent Labs     01/15/24  0305 01/16/24  0119   HGB 13.4* 11.9*     Recent Labs     01/13/24  2356 01/14/24  0415 01/16/24  0119   NA 143   < > 141   K 4.0   < > 3.7   CREATININE 1.0   < > 0.9   GLUCOSE 170*   < > 155*   INR 1.09  --   --     < > = values in this interval not displayed.     No results for input(s): POCGLU in the last 72 hours.        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 Doing well postop without complications .   Plan:   Weight-bear as tolerated  Pain control  Keep accuchecks under 200  PT/OT  DVT prophylaxis  Ice and elevate

## 2024-01-16 NOTE — Progress Notes (Signed)
 Physical Therapy    Name: Raymond Skinner  MRN: [id number]  Date of service: 01/16/2024       01/16/24 1315   Restrictions/Precautions   Restrictions/Precautions Weight Bearing;Fall Risk   Lower Extremity Weight Bearing Restrictions   Right Lower Extremity Weight Bearing Weight Bearing As Tolerated   General   Diagnosis FALL R HIP FX. R THR BY DR DEWEESE. HX OF DEMENTIA   General   General Comments Pt in bed, PCA present to assist   Subjective   Subjective agreed to therapy   Pain   Pain Location Right;Hip   Cognition   Overall Cognitive Status Exceptions   Observation/Palpation   Observation foley cath   Bed mobility   Supine to Sit Partial/Moderate assistance;2 Person assistance   Sit to Supine Partial/Moderate assistance;2 Person assistance   Bed Mobility Comments PCA cleaned foley prior to getting up   Transfers   Sit to Stand 2 Person Assistance;Partial/Moderate assistance   Stand to Sit 2 Person Assistance;Partial/Moderate assistance   Comment sat EOB, no complaints of dizziness. When standing, pt is shaky and weak appearing   Ambulation   Surface Level tile   Device Hand-Held Assist  (x2)   Assistance 2 Person assistance;Partial/Moderate assistance   Distance 3 steps forward/backwward, 2 side steps to [facility]   Short Term Goals   Time Frame for Short Term Goals 2 WKS   Short Term Goal 1 SUP<>SIT, MIN A   Short Term Goal 2 SIT<>STAND, MIN A   Short Term Goal 3 TF'S WITH RW, MIN A   Short Term Goal 4 AMB 25 FT WITH RW, MIN A   Activity Tolerance   Activity Tolerance Patient tolerated treatment well;Treatment limited secondary to decreased cognition   Assessment   Assessment Pt did well this session. Requires 2 person assist. PT was able to perform bed mobility, transfer, and take some steps at bedside. Pt returned to bed and left with all needs in reach. Alarm on   Discharge Recommendations Continue to assess pending progress;Patient would benefit from continued therapy after discharge   Physical Therapy Plan   General  Plan 5-7 times per week   Therapy Duration 2 Weeks   Current Treatment Recommendations Strengthening;Balance training;Functional mobility training;Transfer training;Gait training;Safety education & training;Positioning;Pain management;Patient/Caregiver education & training;Stair training;Therapeutic activities   Additional Comments ASSIST OF TWO. PROGRESS AS DEMENTIA WILL ALLOW.   PT Plan of Care   Saturday X   Safety Devices   Type of Devices Left in bed;Bed alarm in place;Call light within reach   PT Whiteboard Notes   Therapy Whiteboard DEMENTIA, R FEMUR FX/THR, WBAT. RE 9-12   Recommendation   Requires PT Follow-Up Yes           Electronically signed by Thersia Mussel, PTA on 01/16/2024 at 2:32 PM

## 2024-01-17 LAB — BASIC METABOLIC PANEL W/ REFLEX TO MG FOR LOW K
Anion Gap: 10 mmol/L (ref 8–16)
BUN: 22 mg/dL (ref 8–23)
CO2: 26 mmol/L (ref 22–29)
Calcium: 8.7 mg/dL — ABNORMAL LOW (ref 8.8–10.2)
Chloride: 103 mmol/L (ref 98–107)
Creatinine: 0.9 mg/dL (ref 0.7–1.2)
Est, Glom Filt Rate: >90 (ref >60)
Glucose: 126 mg/dL — ABNORMAL HIGH (ref 70–99)
Potassium reflex Magnesium: 3.7 mmol/L (ref 3.5–5.0)
Sodium: 139 mmol/L (ref 136–145)

## 2024-01-17 LAB — CBC WITH AUTO DIFFERENTIAL
Basophils %: 0.6 % (ref 0.0–1.0)
Basophils Absolute: 0 K/uL (ref 0.00–0.20)
Eosinophils %: 3.3 % (ref 0.0–5.0)
Eosinophils Absolute: 0.2 K/uL (ref 0.00–0.60)
Hematocrit: 31.4 % — ABNORMAL LOW (ref 42.0–52.0)
Hemoglobin: 10.5 g/dL — ABNORMAL LOW (ref 14.0–18.0)
Immature Granulocytes #: 0 K/uL
Lymphocytes %: 24.5 % (ref 20.0–40.0)
Lymphocytes Absolute: 1.6 K/uL (ref 1.1–4.5)
MCH: 31.3 pg — ABNORMAL HIGH (ref 27.0–31.0)
MCHC: 33.4 g/dL (ref 33.0–37.0)
MCV: 93.5 fL (ref 80.0–94.0)
MPV: 10.3 fL (ref 9.4–12.4)
Monocytes %: 10 % (ref 0.0–10.0)
Monocytes Absolute: 0.6 K/uL (ref 0.00–0.90)
Neutrophils %: 61.3 % (ref 50.0–65.0)
Neutrophils Absolute: 3.9 K/uL (ref 1.5–7.5)
Platelets: 132 K/uL (ref 130–400)
RBC: 3.36 M/uL — ABNORMAL LOW (ref 4.70–6.10)
RDW: 12.7 % (ref 11.5–14.5)
WBC: 6.4 K/uL (ref 4.8–10.8)

## 2024-01-17 MED FILL — MEMANTINE HCL 5 MG PO TABS: 5 mg | ORAL | Qty: 2

## 2024-01-17 MED FILL — VITAMIN D3 ULTRA STRENGTH 125 MCG (5000 UT) PO CAPS: 125 MCG (5000 UT) | ORAL | Qty: 1

## 2024-01-17 MED FILL — VITAMIN E 180 MG (400 UNIT) PO CAPS: 180 MG (400 UNIT) | ORAL | Qty: 1

## 2024-01-17 MED FILL — ASPIRIN 325 MG PO TBEC: 325 mg | ORAL | Qty: 1

## 2024-01-17 MED FILL — VITAMIN B-12 500 MCG PO TABS: 500 ug | ORAL | Qty: 4

## 2024-01-17 MED FILL — MELATONIN 5 MG PO TBDP: 5 mg | ORAL | Qty: 1

## 2024-01-17 MED FILL — DISULFIRAM 250 MG PO TABS: 250 mg | ORAL | Qty: 2

## 2024-01-17 MED FILL — DONEPEZIL HCL 10 MG PO TABS: 10 mg | ORAL | Qty: 1

## 2024-01-17 MED FILL — ZINC SULFATE 220 (50 ZN) MG PO CAPS: 220 (50 Zn) MG | ORAL | Qty: 1

## 2024-01-17 MED FILL — ACETAMINOPHEN 325 MG PO TABS: 325 mg | ORAL | Qty: 2

## 2024-01-17 MED FILL — VITAMIN B-6 100 MG PO TABS: 100 mg | ORAL | Qty: 1 | Fill #0

## 2024-01-17 MED FILL — QUETIAPINE FUMARATE 25 MG PO TABS: 25 mg | ORAL | Qty: 1

## 2024-01-17 MED FILL — MAG-G 500 (27 MG) MG PO TABS: 500 (27 Mg) MG | ORAL | Qty: 1

## 2024-01-17 MED FILL — ATORVASTATIN CALCIUM 10 MG PO TABS: 10 mg | ORAL | Qty: 1

## 2024-01-17 MED FILL — THEREMS-M PO TABS: ORAL | Qty: 1

## 2024-01-17 MED FILL — MORPHINE SULFATE 2 MG/ML IJ SOLN: 2 mg/mL | INTRAMUSCULAR | Qty: 1

## 2024-01-17 MED FILL — PROMETHAZINE HCL 12.5 MG PO TABS: 12.5 mg | ORAL | Qty: 1

## 2024-01-17 MED FILL — OXYCODONE HCL 5 MG PO TABS: 5 mg | ORAL | Qty: 1

## 2024-01-17 NOTE — Progress Notes (Signed)
 Daily Progress Note    Date:01/17/2024  Patient: Raymond Skinner  DOB: 1955-10-12  FMW:[id number]  CODE:Full Code No additional code details  ERE:Gnwzd, Dorothyann SAILOR, APRN    Admit Date: 01/13/2024 11:38 PM   LOS: 3 days     Chief Complaint   Patient presents with    Altered Mental Status     Pt family reports that pt has had increased AMS. Pt has hx of Dementia, family at home cannot take care of patient looking for placement.          Subjective: POD 3 s/p right THA. Pt's is awake and pleasant, but remains oriented to self only.         Hospital Summary: 68 yo M with dementia who presented to [facility] ED with worsening confusion and recurrent falls.  He was found to have a right femoral neck fracture and admitted to the hospitalist service for further management.  No source of infection identified.     Orthopedics was consulted and pt underwent right THA on 01/14/24.  He remained disoriented post-operatively.   Cog eval pending as family plans to pursue guardianship.         Review of Systems   Unable to perform ROS: Mental status change       Objective:      Vital signs in last 24 hours:  Patient Vitals for the past 24 hrs:   BP Temp Temp src Pulse Resp SpO2 Weight   01/17/24 0715 (!) 148/82 98.2 F (36.8 C) Temporal 68 16 100 % --   01/17/24 0518 -- -- -- -- -- -- 84 kg (185 lb 3 oz)   01/17/24 0421 127/71 97 F (36.1 C) Temporal 66 16 100 % --   01/16/24 1948 138/74 98.4 F (36.9 C) Temporal 74 18 98 % --   01/16/24 1554 118/69 98.2 F (36.8 C) Temporal 80 16 97 % --   01/16/24 1458 -- -- -- -- -- 97 % --   01/16/24 1119 119/72 98.1 F (36.7 C) Temporal 77 16 98 % --       I/O last 3 completed shifts:  In: -   Out: 850 [Urine:850]  No intake/output data recorded.    Physical Exam  Constitutional:       General: He is not in acute distress.     Appearance: He is not toxic-appearing.   Cardiovascular:      Rate and Rhythm: Normal rate and regular rhythm.      Pulses: Normal pulses.      Heart sounds: Normal heart sounds.    Pulmonary:      Effort: Pulmonary effort is normal. No respiratory distress.      Breath sounds: Normal breath sounds.   Abdominal:      General: Bowel sounds are normal. There is no distension.      Palpations: Abdomen is soft.      Tenderness: There is no abdominal tenderness.   Neurological:      Mental Status: He is disoriented.      Comments: Oriented to self only             Lab Review   Recent Results (from the past 24 hours)   Basic Metabolic Panel w/ Reflex to MG    Collection Time: 01/17/24  2:32 AM   Result Value Ref Range    Sodium 139 136 - 145 mmol/L    Potassium reflex Magnesium  3.7 3.5 - 5.0 mmol/L  Chloride 103 98 - 107 mmol/L    CO2 26 22 - 29 mmol/L    Anion Gap 10 8 - 16 mmol/L    Glucose 126 (H) 70 - 99 mg/dL    BUN 22 8 - 23 mg/dL    Creatinine 0.9 0.7 - 1.2 mg/dL    Est, Glom Filt Rate >90 >60    Calcium  8.7 (L) 8.8 - 10.2 mg/dL   CBC with Auto Differential    Collection Time: 01/17/24  2:32 AM   Result Value Ref Range    WBC 6.4 4.8 - 10.8 K/uL    RBC 3.36 (L) 4.70 - 6.10 M/uL    Hemoglobin 10.5 (L) 14.0 - 18.0 g/dL    Hematocrit 68.5 (L) 42.0 - 52.0 %    MCV 93.5 80.0 - 94.0 fL    MCH 31.3 (H) 27.0 - 31.0 pg    MCHC 33.4 33.0 - 37.0 g/dL    RDW 87.2 88.4 - 85.4 %    Platelets 132 130 - 400 K/uL    MPV 10.3 9.4 - 12.4 fL    Neutrophils % 61.3 50.0 - 65.0 %    Lymphocytes % 24.5 20.0 - 40.0 %    Monocytes % 10.0 0.0 - 10.0 %    Eosinophils % 3.3 0.0 - 5.0 %    Basophils % 0.6 0.0 - 1.0 %    Neutrophils Absolute 3.9 1.5 - 7.5 K/uL    Immature Granulocytes # 0.0 K/uL    Lymphocytes Absolute 1.6 1.1 - 4.5 K/uL    Monocytes Absolute 0.60 0.00 - 0.90 K/uL    Eosinophils Absolute 0.20 0.00 - 0.60 K/uL    Basophils Absolute 0.00 0.00 - 0.20 K/uL             Current Facility-Administered Medications:     QUEtiapine  (SEROQUEL ) tablet 25 mg, 25 mg, Oral, Nightly, Floretta Bring, MD, 25 mg at 01/16/24 2100    hydrALAZINE  (APRESOLINE ) injection 10 mg, 10 mg, IntraVENous, Q4H PRN, Dozier Gilmore DASEN,  MD, 10 mg at 01/14/24 2227    morphine  (PF) injection 1 mg, 1 mg, IntraVENous, Q4H PRN, Dozier Gilmore DASEN, MD    morphine  (PF) injection 2 mg, 2 mg, IntraVENous, Q4H PRN, Dozier Gilmore DASEN, MD, 2 mg at 01/17/24 9657    morphine  sulfate (PF) injection 4 mg, 4 mg, IntraVENous, Q4H PRN, Dozier, Gilmore DASEN, MD    oxyCODONE  (ROXICODONE ) immediate release tablet 2.5 mg, 2.5 mg, Oral, Q4H PRN, Dozier, Gilmore DASEN, MD    naloxone  (NARCAN ) injection 0.4 mg, 0.4 mg, IntraVENous, PRN, Dozier, Gilmore DASEN, MD    ondansetron  (ZOFRAN ) injection 4 mg, 4 mg, IntraVENous, Q6H PRN, Dozier, Gilmore DASEN, MD    promethazine  (PHENERGAN ) injection 12.5 mg, 12.5 mg, IntraMUSCular, Q6H PRN, Dozier, Gilmore DASEN, MD    promethazine  (PHENERGAN ) tablet 12.5 mg, 12.5 mg, Oral, Q6H PRN, Dozier Gilmore T, MD, 12.5 mg at 01/16/24 2152    vitamin B-12 (CYANOCOBALAMIN ) tablet 2,000 mcg, 2,000 mcg, Oral, Daily, DeWeese, Frederick T, MD, 2,000 mcg at 01/17/24 9245    disulfiram  (ANTABUSE ) tablet 500 mg, 500 mg, Oral, Daily, DeWeese, Frederick T, MD, 500 mg at 01/16/24 2102    donepezil  (ARICEPT ) tablet 10 mg, 10 mg, Oral, Nightly, DeWeese, Frederick T, MD, 10 mg at 01/16/24 2102    Magnesium  Gluconate tablet 500 mg, 500 mg, Oral, Daily, Dozier Gilmore T, MD, 500 mg at 01/17/24 9245    memantine  (NAMENDA ) tablet 10 mg, 10 mg, Oral, BID, DeWeese,  Gilmore DASEN, MD, 10 mg at 01/17/24 9244    therapeutic multivitamin-minerals 1 tablet, 1 tablet, Oral, Daily, Dozier, Gilmore DASEN, MD, 1 tablet at 01/17/24 9244    atorvastatin  (LIPITOR ) tablet 10 mg, 10 mg, Oral, Daily, Dozier, Gilmore DASEN, MD, 10 mg at 01/17/24 0756    vitamin B-6 (PYRIDOXINE ) tablet 100 mg, 100 mg, Oral, Daily, Dozier Gilmore DASEN, MD, 100 mg at 01/17/24 9244    vitamin D  (CHOLECALCIFEROL ) capsule 5,000 Units, 5,000 Units, Oral, Daily, DeWeese, Frederick T, MD, 5,000 Units at 01/17/24 0755    vitamin E  capsule 800 Units, 800 Units, Oral, Daily, DeWeese, Frederick T, MD,  800 Units at 01/17/24 9244    zinc  sulfate (ZINCATE) 220 mg capsule - elemental zinc  50 mg, 50 mg, Oral, Daily, Dozier Gilmore DASEN, MD, 50 mg at 01/17/24 0756    sodium chloride  flush 0.9 % injection 5-40 mL, 5-40 mL, IntraVENous, 2 times per day, Dozier Gilmore T, MD, 10 mL at 01/17/24 0756    sodium chloride  flush 0.9 % injection 5-40 mL, 5-40 mL, IntraVENous, PRN, Dozier, Gilmore DASEN, MD    0.9 % sodium chloride  infusion, , IntraVENous, PRN, DeWeese, Gilmore DASEN, MD    potassium chloride  (KLOR-CON  M) extended release tablet 40 mEq, 40 mEq, Oral, PRN **OR** potassium bicarb-citric acid  (EFFER-K) effervescent tablet 40 mEq, 40 mEq, Oral, PRN **OR** potassium chloride  10 mEq/100 mL IVPB (Peripheral Line), 10 mEq, IntraVENous, PRN, Dozier, Gilmore DASEN, MD    magnesium  sulfate 2000 mg in 50 mL IVPB premix, 2,000 mg, IntraVENous, PRN, DeWeese, Gilmore DASEN, MD    sodium phosphate  13.26 mmol in sodium chloride  0.9 % 250 mL IVPB, 0.16 mmol/kg, IntraVENous, PRN **OR** sodium phosphate  26.49 mmol in sodium chloride  0.9 % 250 mL IVPB, 0.32 mmol/kg, IntraVENous, PRN, Dozier, Gilmore DASEN, MD    metaxalone  (SKELAXIN ) tablet 800 mg, 800 mg, Oral, Q8H PRN, Dozier, Gilmore DASEN, MD    polyethylene glycol (GLYCOLAX ) packet 17 g, 17 g, Oral, BID PRN, Dozier, Gilmore DASEN, MD    melatonin disintegrating tablet 5 mg, 5 mg, Oral, Nightly PRN, Dozier Gilmore DASEN, MD, 5 mg at 01/16/24 2102    calcium  carbonate (TUMS) chewable tablet 1,000 mg, 1,000 mg, Oral, TID PRN, Dozier Gilmore DASEN, MD    sodium chloride  flush 0.9 % injection 5-40 mL, 5-40 mL, IntraVENous, 2 times per day, Dozier Gilmore T, MD, 10 mL at 01/17/24 0755    sodium chloride  flush 0.9 % injection 5-40 mL, 5-40 mL, IntraVENous, PRN, Dozier, Gilmore DASEN, MD    0.9 % sodium chloride  infusion, , IntraVENous, PRN, DeWeese, Gilmore DASEN, MD    acetaminophen  (TYLENOL ) tablet 650 mg, 650 mg, Oral, Q6H, DeWeese, Frederick T, MD, 650 mg at 01/17/24 9244    oxyCODONE   (ROXICODONE ) immediate release tablet 5 mg, 5 mg, Oral, Q4H PRN, 5 mg at 01/16/24 2102 **OR** oxyCODONE  HCl (OXY-IR) immediate release tablet 10 mg, 10 mg, Oral, Q4H PRN, Dozier Gilmore DASEN, MD    morphine  (PF) injection 2 mg, 2 mg, IntraVENous, Q2H PRN, 2 mg at 01/16/24 0135 **OR** morphine  sulfate (PF) injection 4 mg, 4 mg, IntraVENous, Q2H PRN, Dozier, Gilmore DASEN, MD    aspirin  EC tablet 325 mg, 325 mg, Oral, BID, Dozier Gilmore DASEN, MD, 325 mg at 01/17/24 9244      Imaging:  XR HIP 2-3 VW W PELVIS RIGHT  Result Date: 01/14/2024   1. Interval right total hip arthroplasty with intact hardware in satisfactory position. 2. Foley catheter. 3. Mild sacroiliac and  left femoral osteoarthritis.     .   ______________________________________ Electronically signed by: LONNI BERKSHIRE D.O. Date:     01/14/2024 Time:    18:09     CT PELVIS WO CONTRAST Additional Contrast? None  Result Date: 01/14/2024  Right femoral neck fracture  All CT scans are performed using dose optimization techniques as appropriate to the performed exam and include at least one of the following: Automated exposure control, adjustment of the mA and/or kV according to size, and the use of iterative reconstruction technique.  ______________________________________ Electronically signed by: KANDI BEND M.D. Date:     01/14/2024 Time:    01:34     XR HIP 2-3 VW W PELVIS RIGHT  Result Date: 01/14/2024   Subcapital femoral neck fracture with lateral impaction.    ______________________________________ Electronically signed by: GUSTAVO HIGHTOWER M.D. Date:     01/14/2024 Time:    01:22     XR CHEST PORTABLE  Result Date: 01/14/2024   1. No acute findings.      ______________________________________ Electronically signed by: GUSTAVO HIGHTOWER M.D. Date:     01/14/2024 Time:    01:22     CT HEAD WO CONTRAST  Result Date: 01/14/2024  1. No acute intracranial process. 2. Stable atrophy and chronic small vessel ischemic disease  All CT scans are performed using  dose optimization techniques as appropriate to the performed exam and include at least one of the following: Automated exposure control, adjustment of the mA and/or kV according to size, and the use of iterative reconstruction technique.  ______________________________________ Electronically signed by: KANDI BEND M.D. Date:     01/14/2024 Time:    00:37          Assessment/Plan  Principal Problem:    Closed fracture of right femur, initial encounter [facility])  Active Problems:    Recurrent falls    Dementia (HCC)    Delirium superimposed on dementia  Resolved Problems:    * No resolved hospital problems. *     Right femoral neck fracture  -- Orthopedics consulted  -- S/p right THA on 01/14/24  -- Pain control, bowel regimen  -- DVT prophylaxis  -- PT/OT  -- Case management involved with discharge planning    Dementia with behavioral disturbance  -- Cog eval ordered  -- Delirium precautions  -- Continue Namenda  and Donepezil   -- Continue Seroquel  25 mg QHS      DVT prophylaxis: ASA 325 mg BID    DISPOSITION:  TBD, needs SNF placement    Full Code No additional code details         Complexity of medical decision making: Moderate  Risk of complications and/or morbidity/mortality: Moderate      Velma Archer, MD 01/17/2024 10:34 AM

## 2024-01-17 NOTE — Progress Notes (Signed)
 Physical Therapy    Name: Raymond Skinner  MRN: [id number]  Date of service: 01/17/2024       01/17/24 0930   Restrictions/Precautions   Restrictions/Precautions Weight Bearing;Fall Risk   Lower Extremity Weight Bearing Restrictions   Right Lower Extremity Weight Bearing Weight Bearing As Tolerated   General   Diagnosis FALL R HIP FX. R THR BY DR DEWEESE. HX OF DEMENTIA   General   General Comments Pt in bed, PCA present to assist   Subjective   Subjective agreed to therapy   Pain   Pain Location Right;Hip   Observation/Palpation   Observation foley cath, dementia   Bed mobility   Supine to Sit Minimal assistance   Sit to Supine Minimal assistance   Bed Mobility Comments given max cues   Transfers   Sit to Stand 2 Person Assistance;Minimal Assistance   Stand to Sit 2 Person Assistance;Minimal Assistance   Comment When standing, pt is shaky and weak appearing   Ambulation   Surface Level tile   Device Hand-Held Assist   Assistance 2 Person assistance;Partial/Moderate assistance   Quality of Gait unsteady, shaky, unsafe   Gait Deviations Increased BOS;Decreased step length;Decreased step height   Distance 26ft   Comments in room, try RW   Short Term Goals   Time Frame for Short Term Goals 2 WKS   Short Term Goal 1 SUP<>SIT, MIN A   Short Term Goal 2 SIT<>STAND, MIN A   Short Term Goal 3 TF'S WITH RW, MIN A   Short Term Goal 4 AMB 25 FT WITH RW, MIN A   Activity Tolerance   Activity Tolerance Patient tolerated treatment well   Assessment   Assessment Pt is improving mobility, still requiring 2 person assist for safety. Performed bed mobility, transfered, and ambulated short distance in room. Pt returned to bed and left with all needs in reach. ALARM ON   Discharge Recommendations Continue to assess pending progress;Patient would benefit from continued therapy after discharge   Patient Education   Barriers to Learning Cognition   Physical Therapy Plan   General Plan 5-7 times per week   Therapy Duration 2 Weeks   Current  Treatment Recommendations Strengthening;Balance training;Functional mobility training;Transfer training;Gait training;Safety education & training;Positioning;Pain management;Patient/Caregiver education & training;Stair training;Therapeutic activities   Additional Comments ASSIST OF TWO FOR SAFETY   PT Plan of Care   Sunday X   Safety Devices   Type of Devices Left in bed;Bed alarm in place;Call light within reach  (PCA present)   PT Whiteboard Notes   Therapy Whiteboard DEMENTIA, R FEMUR FX/THR, WBAT. RE 9-12   Recommendation   Requires PT Follow-Up Yes           Electronically signed by Thersia Mussel, PTA on 01/17/2024 at 10:00 AM

## 2024-01-17 NOTE — Plan of Care (Signed)
 Problem: Discharge Planning  Goal: Discharge to home or other facility with appropriate resources  Outcome: Progressing  Flowsheets (Taken 01/17/2024 0745)  Discharge to home or other facility with appropriate resources:   Identify barriers to discharge with patient and caregiver   Arrange for needed discharge resources and transportation as appropriate   Identify discharge learning needs (meds, wound care, etc)   Arrange for interpreters to assist at discharge as needed   Refer to discharge planning if patient needs post-hospital services based on physician order or complex needs related to functional status, cognitive ability or social support system     Problem: Pain  Goal: Verbalizes/displays adequate comfort level or baseline comfort level  Outcome: Progressing     Problem: Confusion  Goal: Confusion, delirium, dementia, or psychosis is improved or at baseline  Description: INTERVENTIONS:  1. Assess for possible contributors to thought disturbance, including medications, impaired vision or hearing, underlying metabolic abnormalities, dehydration, psychiatric diagnoses, and notify attending LIP  2. Institute high risk fall precautions, as indicated  3. Provide frequent short contacts to provide reality reorientation, refocusing and direction  4. Decrease environmental stimuli, including noise as appropriate  5. Monitor and intervene to maintain adequate nutrition, hydration, elimination, sleep and activity  6. If unable to ensure safety without constant attention obtain sitter and review sitter guidelines with assigned personnel  7. Initiate Psychosocial CNS and Spiritual Care consult, as indicated  Outcome: Progressing     Problem: ABCDS Injury Assessment  Goal: Absence of physical injury  Outcome: Progressing     Problem: Risk for Elopement  Goal: Patient will not exit the unit/facility without proper excort  Outcome: Progressing  Flowsheets (Taken 01/17/2024 0745)  Nursing Interventions for Elopement Risk:    Reduce environmental triggers   Shoes and clothing collected and placed in gown attire     Problem: Safety - Adult  Goal: Free from fall injury  Outcome: Progressing

## 2024-01-17 NOTE — Plan of Care (Signed)
 Problem: Discharge Planning  Goal: Discharge to home or other facility with appropriate resources  01/17/2024 2308 by Kirt Friday, RN  Outcome: Progressing  01/17/2024 1317 by Johnnye Setter, RN  Outcome: Progressing  Flowsheets (Taken 01/17/2024 0745)  Discharge to home or other facility with appropriate resources:   Identify barriers to discharge with patient and caregiver   Arrange for needed discharge resources and transportation as appropriate   Identify discharge learning needs (meds, wound care, etc)   Arrange for interpreters to assist at discharge as needed   Refer to discharge planning if patient needs post-hospital services based on physician order or complex needs related to functional status, cognitive ability or social support system     Problem: Pain  Goal: Verbalizes/displays adequate comfort level or baseline comfort level  01/17/2024 2308 by Kirt Friday, RN  Outcome: Progressing  01/17/2024 1317 by Johnnye Setter, RN  Outcome: Progressing     Problem: Confusion  Goal: Confusion, delirium, dementia, or psychosis is improved or at baseline  Description: INTERVENTIONS:  1. Assess for possible contributors to thought disturbance, including medications, impaired vision or hearing, underlying metabolic abnormalities, dehydration, psychiatric diagnoses, and notify attending LIP  2. Institute high risk fall precautions, as indicated  3. Provide frequent short contacts to provide reality reorientation, refocusing and direction  4. Decrease environmental stimuli, including noise as appropriate  5. Monitor and intervene to maintain adequate nutrition, hydration, elimination, sleep and activity  6. If unable to ensure safety without constant attention obtain sitter and review sitter guidelines with assigned personnel  7. Initiate Psychosocial CNS and Spiritual Care consult, as indicated  01/17/2024 2308 by Kirt Friday, RN  Outcome: Progressing  01/17/2024 1317 by Johnnye Setter, RN  Outcome: Progressing      Problem: ABCDS Injury Assessment  Goal: Absence of physical injury  01/17/2024 2308 by Kirt Friday, RN  Outcome: Progressing  01/17/2024 1317 by Johnnye Setter, RN  Outcome: Progressing     Problem: Risk for Elopement  Goal: Patient will not exit the unit/facility without proper excort  01/17/2024 2308 by Kirt Friday, RN  Outcome: Progressing  01/17/2024 1317 by Johnnye Setter, RN  Outcome: Progressing  Flowsheets (Taken 01/17/2024 0745)  Nursing Interventions for Elopement Risk:   Reduce environmental triggers   Shoes and clothing collected and placed in gown attire     Problem: Safety - Adult  Goal: Free from fall injury  01/17/2024 2308 by Kirt Friday, RN  Outcome: Progressing  01/17/2024 1317 by Johnnye Setter, RN  Outcome: Progressing     Problem: Skin/Tissue Integrity  Goal: Skin integrity remains intact  Description: 1.  Monitor for areas of redness and/or skin breakdown  2.  Assess vascular access sites hourly  3.  Every 4-6 hours minimum:  Change oxygen saturation probe site  4.  Every 4-6 hours:  If on nasal continuous positive airway pressure, respiratory therapy assess nares and determine need for appliance change or resting period  Outcome: Progressing

## 2024-01-18 LAB — CBC WITH AUTO DIFFERENTIAL
Basophils %: 0.7 % (ref 0.0–1.0)
Basophils Absolute: 0 K/uL (ref 0.00–0.20)
Eosinophils %: 3.4 % (ref 0.0–5.0)
Eosinophils Absolute: 0.2 K/uL (ref 0.00–0.60)
Hematocrit: 31.6 % — ABNORMAL LOW (ref 42.0–52.0)
Hemoglobin: 10.5 g/dL — ABNORMAL LOW (ref 14.0–18.0)
Immature Granulocytes #: 0 K/uL
Lymphocytes %: 27.6 % (ref 20.0–40.0)
Lymphocytes Absolute: 1.2 K/uL (ref 1.1–4.5)
MCH: 30.8 pg (ref 27.0–31.0)
MCHC: 33.2 g/dL (ref 33.0–37.0)
MCV: 92.7 fL (ref 80.0–94.0)
MPV: 9.6 fL (ref 9.4–12.4)
Monocytes %: 9.4 % (ref 0.0–10.0)
Monocytes Absolute: 0.4 K/uL (ref 0.00–0.90)
Neutrophils %: 58.7 % (ref 50.0–65.0)
Neutrophils Absolute: 2.6 K/uL (ref 1.5–7.5)
Platelets: 155 K/uL (ref 130–400)
RBC: 3.41 M/uL — ABNORMAL LOW (ref 4.70–6.10)
RDW: 12.5 % (ref 11.5–14.5)
WBC: 4.5 K/uL — ABNORMAL LOW (ref 4.8–10.8)

## 2024-01-18 LAB — BASIC METABOLIC PANEL W/ REFLEX TO MG FOR LOW K
Anion Gap: 9 mmol/L (ref 8–16)
BUN: 19 mg/dL (ref 8–23)
CO2: 29 mmol/L (ref 22–29)
Calcium: 8.4 mg/dL — ABNORMAL LOW (ref 8.8–10.2)
Chloride: 102 mmol/L (ref 98–107)
Creatinine: 0.7 mg/dL (ref 0.7–1.2)
Est, Glom Filt Rate: >90 (ref >60)
Glucose: 137 mg/dL — ABNORMAL HIGH (ref 70–99)
Potassium reflex Magnesium: 3.9 mmol/L (ref 3.5–5.0)
Sodium: 140 mmol/L (ref 136–145)

## 2024-01-18 MED FILL — VITAMIN B-12 500 MCG PO TABS: 500 ug | ORAL | Qty: 4

## 2024-01-18 MED FILL — MEMANTINE HCL 5 MG PO TABS: 5 mg | ORAL | Qty: 2

## 2024-01-18 MED FILL — MAG-G 500 (27 MG) MG PO TABS: 500 (27 Mg) MG | ORAL | Qty: 1

## 2024-01-18 MED FILL — ZINC SULFATE 220 (50 ZN) MG PO CAPS: 220 (50 Zn) MG | ORAL | Qty: 1

## 2024-01-18 MED FILL — DONEPEZIL HCL 10 MG PO TABS: 10 mg | ORAL | Qty: 1

## 2024-01-18 MED FILL — OXYCODONE HCL 5 MG PO TABS: 5 mg | ORAL | Qty: 1

## 2024-01-18 MED FILL — ACETAMINOPHEN 325 MG PO TABS: 325 mg | ORAL | Qty: 2

## 2024-01-18 MED FILL — DISULFIRAM 250 MG PO TABS: 250 mg | ORAL | Qty: 2

## 2024-01-18 MED FILL — ATORVASTATIN CALCIUM 10 MG PO TABS: 10 mg | ORAL | Qty: 1

## 2024-01-18 MED FILL — ASPIRIN 325 MG PO TBEC: 325 mg | ORAL | Qty: 1

## 2024-01-18 MED FILL — VITAMIN D3 ULTRA STRENGTH 125 MCG (5000 UT) PO CAPS: 125 MCG (5000 UT) | ORAL | Qty: 1

## 2024-01-18 MED FILL — QUETIAPINE FUMARATE 25 MG PO TABS: 25 mg | ORAL | Qty: 1

## 2024-01-18 MED FILL — THEREMS-M PO TABS: ORAL | Qty: 1

## 2024-01-18 MED FILL — VITAMIN E 180 MG (400 UNIT) PO CAPS: 180 MG (400 UNIT) | ORAL | Qty: 1

## 2024-01-18 MED FILL — VITAMIN B-6 100 MG PO TABS: 100 mg | ORAL | Qty: 1 | Fill #0

## 2024-01-19 LAB — CBC WITH AUTO DIFFERENTIAL
Basophils %: 1.2 % — ABNORMAL HIGH (ref 0.0–1.0)
Basophils Absolute: 0.1 K/uL (ref 0.00–0.20)
Eosinophils %: 4.1 % (ref 0.0–5.0)
Eosinophils Absolute: 0.2 K/uL (ref 0.00–0.60)
Hematocrit: 30.2 % — ABNORMAL LOW (ref 42.0–52.0)
Hemoglobin: 10.5 g/dL — ABNORMAL LOW (ref 14.0–18.0)
Immature Granulocytes #: 0 K/uL
Lymphocytes %: 30.9 % (ref 20.0–40.0)
Lymphocytes Absolute: 1.3 K/uL (ref 1.1–4.5)
MCH: 31.3 pg — ABNORMAL HIGH (ref 27.0–31.0)
MCHC: 34.8 g/dL (ref 33.0–37.0)
MCV: 90.1 fL (ref 80.0–94.0)
MPV: 9.8 fL (ref 9.4–12.4)
Monocytes %: 9.8 % (ref 0.0–10.0)
Monocytes Absolute: 0.4 K/uL (ref 0.00–0.90)
Neutrophils %: 53.5 % (ref 50.0–65.0)
Neutrophils Absolute: 2.2 K/uL (ref 1.5–7.5)
Platelets: 184 K/uL (ref 130–400)
RBC: 3.35 M/uL — ABNORMAL LOW (ref 4.70–6.10)
RDW: 12.4 % (ref 11.5–14.5)
WBC: 4.2 K/uL — ABNORMAL LOW (ref 4.8–10.8)

## 2024-01-19 LAB — BASIC METABOLIC PANEL W/ REFLEX TO MG FOR LOW K
Anion Gap: 8 mmol/L (ref 8–16)
BUN: 12 mg/dL (ref 8–23)
CO2: 26 mmol/L (ref 22–29)
Calcium: 8.5 mg/dL — ABNORMAL LOW (ref 8.8–10.2)
Chloride: 107 mmol/L (ref 98–107)
Creatinine: 0.7 mg/dL (ref 0.7–1.2)
Est, Glom Filt Rate: >90 (ref >60)
Glucose: 139 mg/dL — ABNORMAL HIGH (ref 70–99)
Potassium reflex Magnesium: 3.9 mmol/L (ref 3.5–5.0)
Sodium: 141 mmol/L (ref 136–145)

## 2024-01-19 MED ORDER — OXYCODONE HCL 5 MG PO TABS
5 | ORAL_TABLET | Freq: Four times a day (QID) | ORAL | 0 refills | Status: AC | PRN
Start: 2024-01-19 — End: 2024-01-22

## 2024-01-19 MED ORDER — SENNA-DOCUSATE SODIUM 8.6-50 MG PO TABS
8.6-50 | Freq: Every day | ORAL | Status: DC
Start: 2024-01-19 — End: 2024-01-19
  Administered 2024-01-19: 17:00:00 2 via ORAL

## 2024-01-19 MED ORDER — ASPIRIN 325 MG PO TBEC
325 | ORAL_TABLET | Freq: Two times a day (BID) | ORAL | 0 refills | 30.00000 days | Status: DC
Start: 2024-01-19 — End: 2024-02-29

## 2024-01-19 MED ORDER — GLYCERIN (LAXATIVE) 2 G RE SUPP
2 | Freq: Once | RECTAL | Status: AC
Start: 2024-01-19 — End: 2024-01-19
  Administered 2024-01-19: 18:00:00 2 via RECTAL

## 2024-01-19 MED ORDER — QUETIAPINE FUMARATE 25 MG PO TABS
25 | ORAL_TABLET | Freq: Every evening | ORAL | 3 refills | 30.00000 days | Status: DC
Start: 2024-01-19 — End: 2024-02-29

## 2024-01-19 MED ORDER — SENNA-DOCUSATE SODIUM 8.6-50 MG PO TABS
8.6-50 | ORAL_TABLET | Freq: Every day | ORAL | 1 refills | 30.00000 days | Status: DC
Start: 2024-01-19 — End: 2024-02-29

## 2024-01-19 MED ORDER — MAGNESIUM HYDROXIDE 400 MG/5ML PO SUSP
400 | Freq: Once | ORAL | Status: AC
Start: 2024-01-19 — End: 2024-01-19
  Administered 2024-01-19: 17:00:00 30 mL via ORAL

## 2024-01-19 MED FILL — OXYCODONE HCL 10 MG PO TABS: 10 mg | ORAL | Qty: 1

## 2024-01-19 MED FILL — MEMANTINE HCL 5 MG PO TABS: 5 mg | ORAL | Qty: 2

## 2024-01-19 MED FILL — ASPIRIN 325 MG PO TBEC: 325 mg | ORAL | Qty: 1

## 2024-01-19 MED FILL — DONEPEZIL HCL 10 MG PO TABS: 10 mg | ORAL | Qty: 1

## 2024-01-19 MED FILL — GLYCERIN (ADULT) 2 G RE SUPP: 2 g | RECTAL | Qty: 1

## 2024-01-19 MED FILL — QUETIAPINE FUMARATE 25 MG PO TABS: 25 mg | ORAL | Qty: 1

## 2024-01-19 MED FILL — DISULFIRAM 250 MG PO TABS: 250 mg | ORAL | Qty: 2

## 2024-01-19 MED FILL — METAXALONE 800 MG PO TABS: 800 mg | ORAL | Qty: 1

## 2024-01-19 MED FILL — MILK OF MAGNESIA 7.75 % PO SUSP: 7.75 % | ORAL | Qty: 30

## 2024-01-19 MED FILL — VITAMIN B-6 100 MG PO TABS: 100 mg | ORAL | Qty: 1 | Fill #0

## 2024-01-19 MED FILL — SENOKOT S 8.6-50 MG PO TABS: 8.6-50 mg | ORAL | Qty: 2

## 2024-01-21 NOTE — Telephone Encounter (Signed)
 Care Transitions Initial Follow Up Call    Outreach made within 2 business days of discharge: Yes    Patient: Raymond Skinner Patient DOB: 04-Dec-1955   MRN: [id number]  Reason for Admission: dementia and fracture hip   Discharge Date: 01/19/24       Spoke with: [name], caregiver    Discharge department/facility: [facility] S[facility]on with [name] and pt is at [facility]    Scheduled appointment with PCP within 7-14 days    Follow Up  Future Appointments   Date Time Provider Department Center   02/15/2024  9:00 AM Dozier, Gilmore DASEN, MD [place] [facility]   04/08/2024  9:20 AM Joshua Dorothyann SAILOR, APRN [facility]       Rosina Dage, MA

## 2024-01-22 NOTE — Telephone Encounter (Signed)
 Follow up phone call x 1 attempt.  No one answered.  Electronically signed by Lamarr LITTIE Elks, RN on 01/22/2024 at 10:18 AM

## 2024-02-15 ENCOUNTER — Encounter: Payer: Medicare (Managed Care) | Attending: Orthopaedic Surgery | Primary: Family

## 2024-02-15 NOTE — Telephone Encounter (Signed)
 Spoke with patient's SIL and he is now a resident of [facility] nursing home. Taking Nikki off as PCP, per verbal order from [facility]. Will close referrals as well.

## 2024-02-26 NOTE — Telephone Encounter (Signed)
"  Care Transitions Initial Follow Up Call    Patient: Raymond Skinner Patient DOB: 23-Jan-1956   MRN: [id number]  Reason for Admission: s/p hip replacement   Discharge Date: 01/19/24      Discharge department/facility: [facility]     Outreach made within 2 business days of discharge: Yes, left message - first attempt.    Scheduled appointment with PCP within 0-14 days    Follow Up  Future Appointments   Date Time Provider Department Center   02/29/2024 10:20 AM Emmette Ferdie Mayo, APRN [facility] [place] [facility]   04/08/2024  9:20 AM Joshua Dorothyann SAILOR, APRN [facility]       Rosina Dage, MA    "

## 2024-02-29 ENCOUNTER — Ambulatory Visit: Admit: 2024-02-29 | Discharge: 2024-02-29 | Payer: Medicare (Managed Care) | Attending: Family | Primary: Family

## 2024-02-29 VITALS — BP 104/66 | HR 66 | Temp 97.80000°F | Ht 73.0 in | Wt 193.0 lb

## 2024-02-29 DIAGNOSIS — S72011A Unspecified intracapsular fracture of right femur, initial encounter for closed fracture: Principal | ICD-10-CM

## 2024-02-29 MED ORDER — SIMVASTATIN 20 MG PO TABS
20 | ORAL_TABLET | Freq: Every evening | ORAL | 11 refills | 90.00000 days | Status: DC
Start: 2024-02-29 — End: 2024-04-08

## 2024-02-29 MED ORDER — ASPIRIN 325 MG PO TABS
325 | ORAL_TABLET | Freq: Every day | ORAL | 11 refills | 30.00000 days | Status: DC
Start: 2024-02-29 — End: 2024-04-08

## 2024-02-29 NOTE — Patient Instructions (Signed)
"  Call and schedule follow up with Dr Dozier   Phone: [phone]     Stop depakote  Cut aspirin  back to once a day with food.     "

## 2024-02-29 NOTE — Telephone Encounter (Signed)
"  Olam with [facility] services called about referral. They are out of service area. Will need to be referred elsewhere.  "

## 2024-02-29 NOTE — Progress Notes (Signed)
 "    Date of Office Visit:  02/29/2024  Date of Hospital Admission: 01/13/24  Date of Hospital Discharge: 01/19/24  Admitted skilled nursing facility: 01/19/2024  Discharged:   Readmission Risk Score (high >=14%. Medium >=10%):Readmission Risk Score: 14      Care management risk score Rising risk (score 2-5) and Complex Care (Scores >=6): No Risk Score On File     Non face to face  following discharge, date last encounter closed (first attempt may have been earlier): 02/26/2024     Call initiated 2 business days of discharge: Yes, 1st attempt     Raymond Skinner (DOB:  12-31-1955) is a 68 y.o. male, Established patient, here for evaluation of the following chief complaint(s):  Follow-Up from Hospital (Here for follow up after being in [facility] from 9/2 to 10/9. Left hip replacement due to fall and was operated on by dr dozier )         Assessment & Plan  1. Postoperative status following right total hip replacement: Stable. He was discharged from the skilled nursing facility on 02/25/2024.  - Continue with home health visits for physical and occupational therapy.  - Follow-up appointment with Dr. Dozier recommended to ensure proper postoperative care and avoid potential complications.    2. Medication management:  - Simvastatin  20 mg daily.  - Memantine  10 mg twice a day.  - Donepezil  10 mg at night.  - Aspirin  once a day, to be taken in the morning with food.  - Refills for simvastatin  and aspirin  sent to pharmacy.    3. Mood stabilization: Depakote (divalproex) twice a day.  - Discontinue Depakote as there is no clear indication for its use, and no mood issues since returning home.    4. Health maintenance:  - Administer influenza vaccine today.    Follow-up  - Appointment scheduled with Levon on 04/08/2024.  - Follow-up with Dr. Dozier recommended.    Results      Closed subcapital fracture of right femur, initial encounter (HCC)  -     simvastatin  (ZOCOR ) 20 MG tablet; Take 1 tablet by mouth nightly,  Disp-30 tablet, R-11Normal  -     aspirin  (BAYER ASPIRIN ) 325 MG tablet; Take 1 tablet by mouth daily, Disp-30 tablet, R-11Normal  S/P total right hip arthroplasty  -     simvastatin  (ZOCOR ) 20 MG tablet; Take 1 tablet by mouth nightly, Disp-30 tablet, R-11Normal  -     aspirin  (BAYER ASPIRIN ) 325 MG tablet; Take 1 tablet by mouth daily, Disp-30 tablet, R-11Normal  -     External Referral To Home Health  Mixed hyperlipidemia  Need for influenza vaccination  -     Influenza, FLUAD Trivalent, (age 62 y+), IM, Preservative Free, 0.5mL  Hospital discharge follow-up  -     PR DISCHARGE MEDS RECONCILED W/ CURRENT OUTPATIENT MED LIST      Medical Decision Making: moderate complexity  Return if symptoms worsen or fail to improve.           Return if symptoms worsen or fail to improve.       Subjective   History of Present Illness  The patient is a 68 year old male who presents for follow-up after discharge from a skilled nursing facility.    Fall and Hip Fracture  - Experienced a fall in August  - Admitted on 01/13/2024 at [facility] with a closed fracture of the right femur  - Underwent a right total hip replacement on 01/14/2024  with Dr. Clancy  - Discharged from the hospital on 01/19/2024 to a skilled nursing facility  - Discharged from the skilled nursing facility on 02/25/2024  - Managing well at home  - Receiving physical and occupational therapy  - Uses a walker, although not as frequently as recommended    Current Symptoms and Health Status  - Reports no chest pain or breathing difficulties  - Bowel movements are regular  - Small spot on his hip from his time at the nursing home  - Not experiencing any pain  - Able to walk without difficulty  - Has not yet scheduled a follow-up appointment with Dr. Clancy    Medications  - Currently taking simvastatin  20 mg daily, memantine  10 mg twice daily, donepezil  10 mg at night, and aspirin  once daily  - Not taking Antabuse   - Reports no alcohol consumption  - Prescribed  Depakote twice daily during his stay at the skilled nursing facility, prefers to discontinue it    Additional Information  - Unable to drive and requires assistance for transportation  - Doing better since his stay at the nursing home, although initially confused upon arrival  - Interested in receiving the influenza vaccine today but declines the COVID-19 vaccine    Occupation: Retired  Alcohol: Reports no alcohol consumption    PAST SURGICAL HISTORY:  - Right total hip replacement on 01/14/2024    Prior to Visit Medications   Medication Sig Taking? Authorizing Provider   simvastatin  (ZOCOR ) 20 MG tablet Take 1 tablet by mouth nightly Yes Cherly Erno, Ferdie Mayo, APRN   aspirin  (BAYER ASPIRIN ) 325 MG tablet Take 1 tablet by mouth daily Yes Uchenna Rappaport, Ferdie Mayo, APRN   memantine  (NAMENDA ) 10 MG tablet Take 1 tablet by mouth 2 times daily Yes Joshua Dorothyann SAILOR, APRN   donepezil  (ARICEPT ) 10 MG tablet Take 1 tablet by mouth nightly Yes Joshua Dorothyann SAILOR, APRN   Multiple Vitamins-Minerals (THERAPEUTIC MULTIVITAMIN-MINERALS) tablet Take 1 tablet by mouth daily Yes [provider]   vitamin B-6 (PYRIDOXINE ) 100 MG tablet Take 1 tablet by mouth daily Yes [provider]   cyanocobalamin  1000 MCG tablet Take 2.5 tablets by mouth daily Yes [provider]   magnesium  30 MG tablet Take 250 mg by mouth daily Yes [provider]   Potassium 99 MG TABS Take by mouth Yes [provider]   zinc  gluconate 50 MG tablet Take 1 tablet by mouth daily Yes [provider]   Ginkgo 60 MG TABS Take by mouth Yes [provider]        No Known Allergies    Past Medical History:   Diagnosis Date    Dementia (HCC)     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       Past Surgical History:   Procedure Laterality Date    TOTAL HIP ARTHROPLASTY Right 01/14/2024    HIP TOTAL ARTHROPLASTY performed by Dozier Gilmore DASEN, MD at [facility] OR       Family History   Problem Relation Age of  Onset    Diabetes Mother     Dementia Father     No Known Problems Maternal Uncle     Dementia Paternal Aunt     Dementia Maternal Grandmother     Dementia Maternal Grandfather     Dementia Paternal Grandmother     Dementia Paternal Grandfather          Review of Systems   Constitutional:  Negative for activity change, appetite change, fatigue, fever and unexpected weight change.   HENT:  Negative for ear pain, rhinorrhea, sore throat and trouble swallowing.    Eyes:  Negative for visual disturbance.   Respiratory:  Negative for cough and shortness of breath.    Cardiovascular:  Negative for chest pain, palpitations and leg swelling.   Gastrointestinal:  Negative for abdominal pain, constipation, diarrhea, nausea and vomiting.   Genitourinary:  Negative for flank pain.   Musculoskeletal:  Positive for arthralgias. Negative for myalgias, neck pain and neck stiffness.   Neurological:  Negative for headaches.   Psychiatric/Behavioral:  Negative for decreased concentration and sleep disturbance. The patient is not nervous/anxious.           Objective   Blood pressure 104/66, pulse 66, temperature 97.8 F (36.6 C), temperature source Temporal, height 1.854 m (6' 1), weight 87.5 kg (193 lb), SpO2 96%.       Physical Exam  Vitals reviewed.   Constitutional:       Appearance: Normal appearance.   HENT:      Head: Normocephalic and atraumatic.   Eyes:      Conjunctiva/sclera: Conjunctivae normal.   Cardiovascular:      Rate and Rhythm: Normal rate and regular rhythm.      Pulses: Normal pulses.      Heart sounds: Normal heart sounds.   Pulmonary:      Effort: Pulmonary effort is normal.      Breath sounds: Normal breath sounds.   Abdominal:      Palpations: Abdomen is soft.   Musculoskeletal:         General: Normal range of motion.      Cervical back: Normal range of motion and neck supple.        Legs:    Skin:     General: Skin is warm.   Neurological:      General: No focal deficit present.      Mental Status: He is  alert.             The patient (or guardian, if applicable) and other individuals in attendance with the patient were advised that Artificial Intelligence will be utilized during this visit to record, process the conversation to generate a clinical note and to support improvement of the AI technology. The patient (or guardian, if applicable) and other individuals in attendance at the appointment consented to the use of AI, including the recording.      An electronic signature was used to authenticate this note.    --Ferdie Selinda Santiago, APRN     "

## 2024-02-29 NOTE — Progress Notes (Signed)
"      Vaccine Information Sheet, Influenza - Inactivated  given to Raymond Skinner, or parent/legal guardian of  Raymond Skinner and verbalized understanding.  Patient was given High Dose Flu Vaccine.   Patient responses:    Have you ever had a reaction to a flu vaccine? NO  Do you have an adverse effect when eating eggs?  NO  Do you have any current illness?  NO  Have you ever had Guillian Barre Syndrome? NO    Flu vaccine given per order. Please see immunization tab.  Patient tolerated well.   Patient is to call the office with any complications from vaccine.         "

## 2024-04-08 ENCOUNTER — Encounter

## 2024-04-08 ENCOUNTER — Ambulatory Visit: Admit: 2024-04-08 | Discharge: 2024-04-08 | Payer: Medicare (Managed Care) | Attending: Family | Primary: Family

## 2024-04-08 LAB — COMPREHENSIVE METABOLIC PANEL
ALT: 16 U/L (ref 10–50)
AST: 20 U/L (ref 10–50)
Albumin: 4.1 g/dL (ref 3.5–5.2)
Alkaline Phosphatase: 77 U/L (ref 40–129)
Anion Gap: 8 mmol/L (ref 8–16)
BUN: 7 mg/dL — ABNORMAL LOW (ref 8–23)
CO2: 28 mmol/L (ref 22–29)
Calcium: 9.1 mg/dL (ref 8.8–10.2)
Chloride: 104 mmol/L (ref 98–107)
Creatinine: 0.8 mg/dL (ref 0.7–1.2)
Est, Glom Filt Rate: >90 (ref >60)
Glucose: 123 mg/dL — ABNORMAL HIGH (ref 70–99)
Potassium: 4.2 mmol/L (ref 3.5–5.1)
Sodium: 140 mmol/L (ref 136–145)
Total Bilirubin: 0.3 mg/dL (ref 0.2–1.2)
Total Protein: 6.6 g/dL (ref 6.4–8.3)

## 2024-04-08 LAB — CBC WITH AUTO DIFFERENTIAL
Basophils %: 1 % (ref 0.0–1.0)
Basophils Absolute: 0.1 K/uL (ref 0.00–0.20)
Eosinophils %: 1.7 % (ref 0.0–5.0)
Eosinophils Absolute: 0.1 K/uL (ref 0.00–0.60)
Hematocrit: 40.9 % — ABNORMAL LOW (ref 42.0–52.0)
Hemoglobin: 12.9 g/dL — ABNORMAL LOW (ref 14.0–18.0)
Immature Granulocytes #: 0 K/uL
Lymphocytes %: 25.7 % (ref 20.0–40.0)
Lymphocytes Absolute: 1.2 K/uL (ref 1.1–4.5)
MCH: 28.9 pg (ref 27.0–31.0)
MCHC: 31.5 g/dL — ABNORMAL LOW (ref 33.0–37.0)
MCV: 91.7 fL (ref 80.0–94.0)
MPV: 10.3 fL (ref 9.4–12.4)
Monocytes %: 7.1 % (ref 0.0–10.0)
Monocytes Absolute: 0.3 K/uL (ref 0.00–0.90)
Neutrophils %: 64.5 % (ref 50.0–65.0)
Neutrophils Absolute: 3.1 K/uL (ref 1.5–7.5)
Platelets: 221 K/uL (ref 130–400)
RBC: 4.46 M/uL — ABNORMAL LOW (ref 4.70–6.10)
RDW: 12.9 % (ref 11.5–14.5)
WBC: 4.8 K/uL (ref 4.8–10.8)

## 2024-04-08 LAB — TSH: TSH: 1.26 u[IU]/mL (ref 0.270–4.200)

## 2024-04-08 LAB — T4, FREE: T4 Free: 0.8 ng/dL — ABNORMAL LOW (ref 0.93–1.70)

## 2024-04-08 LAB — HEPATITIS C ANTIBODY: Hep C Ab Interp: NONREACTIVE

## 2024-04-08 MED ORDER — SIMVASTATIN 20 MG PO TABS
20 | ORAL_TABLET | Freq: Every evening | ORAL | 11 refills | 90.00000 days | Status: AC
Start: 2024-04-08 — End: ?

## 2024-04-08 MED ORDER — DISULFIRAM 250 MG PO TABS
250 | ORAL_TABLET | Freq: Two times a day (BID) | ORAL | 11 refills | Status: AC
Start: 2024-04-08 — End: ?

## 2024-04-08 MED ORDER — ASPIRIN 325 MG PO TABS
325 | ORAL_TABLET | Freq: Every day | ORAL | 11 refills | 30.00000 days | Status: AC
Start: 2024-04-08 — End: ?

## 2024-04-08 MED ORDER — DONEPEZIL HCL 10 MG PO TABS
10 | ORAL_TABLET | Freq: Every evening | ORAL | 11 refills | 90.00000 days | Status: AC
Start: 2024-04-08 — End: ?

## 2024-04-08 MED ORDER — MEMANTINE HCL 10 MG PO TABS
10 | ORAL_TABLET | Freq: Two times a day (BID) | ORAL | 11 refills | 22.50000 days | Status: AC
Start: 2024-04-08 — End: ?

## 2024-04-08 NOTE — Patient Instructions (Signed)
 "     Learning About Lung Cancer Screening  What is screening for lung cancer?     Lung cancer screening is a way to find some lung cancers early, before a person has any symptoms of the cancer.  Lung cancer screening may help those who have the highest risk for lung cancer--people age 67 and older who are or were heavy smokers. For most people, who aren't at increased risk, screening for lung cancer probably isn't helpful.  Screening won't prevent cancer. And it may not find all lung cancers. Lung cancer screening may lower the risk of dying from lung cancer in a small number of people.  How is it done?  Lung cancer screening is done with a low-dose CT (computed tomography) scan. A CT scan uses X-rays, or radiation, to make detailed pictures of your body. Experts recommend that screening be done in medical centers that focus on finding and treating lung cancer.  Who is screening recommended for?  Lung cancer screening is recommended for people age 91 and older who are or were heavy smokers. That means people with a smoking history of at least 20 pack years. A pack year is a way to measure how heavy a smoker you are or were.  To figure out your pack years, multiply how many packs a day on average (assuming 20 cigarettes per pack) you have smoked by how many years you have smoked. For example:  If you smoked 1 pack a day for 20 years, that's 1 times 20. So you have a smoking history of 20 pack years.  If you smoked 2 packs a day for 10 years, that's 2 times 10. So you have a smoking history of 20 pack years.  Experts agree that screening is for people who have a high risk of lung cancer. But experts don't agree on what high risk means. Some say people age 68 or older with at least a 20-pack-year smoking history are high risk. Others say it's people age 30 or older with a 30-pack-year history.  To see if you could benefit from screening, first find out if you are at high risk for lung cancer. Your doctor can help you  decide your lung cancer risk.  What are the risks of screening?  CT screening for lung cancer isn't perfect. It can show an abnormal result when it turns out there wasn't any cancer. This is called a false-positive result. This means you may need more tests to make sure you don't have cancer. These tests can be harmful and cause a lot of worry.  These tests may include more CT scans and invasive testing like a lung biopsy. In a biopsy, the doctor takes a sample of tissue from inside your lung so it can be looked at under a microscope. A biopsy is the only way to tell if you have lung cancer. If the biopsy finds cancer, you and your doctor will have to decide how or whether to treat it.  Some lung cancers found on CT scans are harmless and would not have caused a problem if they had not been found through screening. But because doctors can't tell which ones will turn out to be harmless, most will be treated. This means that you may get treatment--including surgery, radiation, or chemotherapy--that you don't need.  There is a risk of damage to cells or tissue from being exposed to radiation, including the small amounts used in CTs, X-rays, and other medical tests. Over time, exposure  to radiation may cause cancer and other health problems. But in most cases, the risk of getting cancer from being exposed to small amounts of radiation is low. It's not a reason to avoid these tests for most people.  What are the benefits of screening?  Your scan may be normal (negative).  For some people who are at higher risk, screening lowers the chance of dying of lung cancer. How much and how long you smoked helps to determine your risk level. Screening can find some cancers early, when treatment may be more likely to work.  What happens after screening?  The results of your CT scan will be sent to your doctor. Someone from your care team will explain the results of your scan and answer any questions you may have. If you need any  follow-up, he or she will help you understand what to do next.  After a lung cancer screening, you can go back to your usual activities right away.  A lung cancer screening test can't tell if you have lung cancer. If your results are positive, your doctor can't tell whether an abnormal finding is a harmless nodule, cancer, or something else without doing more tests.  What can you do to help prevent lung cancer?  Some lung cancers can't be prevented. But if you smoke, quitting smoking is the best step you can take to prevent lung cancer. If you want to quit, your doctor can recommend medicines or other ways to help.  Follow-up care is a key part of your treatment and safety. Be sure to make and go to all appointments, and call your doctor if you are having problems. It's also a good idea to know your test results and keep a list of the medicines you take.  Where can you learn more?  Go to [employer] and enter Q940 to learn more about Learning About Lung Cancer Screening.  Current as of: March 13, 2023  Content Version: 14.6   2024-2025 [place], [place].   Care instructions adapted under license by [facility]. If you have questions about a medical condition or this instruction, always ask your healthcare professional. Romayne Alderman, [facility], disclaims any warranty or liability for your use of this information.    "

## 2024-04-08 NOTE — Progress Notes (Signed)
 "Discussed with the patient the current USPSTF guidelines released July 26, 2019 for screening for lung cancer.    For adults aged 67 to 22 years who have a 20 pack-year smoking history and currently smoke or have quit within the past 15 years the grade B recommendation is to:  Screen for lung cancer with low-dose computed tomography (LDCT) every year.  Stop screening once a person has not smoked for 15 years or has a health problem that limits life expectancy or the ability to have lung surgery.    The patient  reports that he quit smoking about 6 years ago. His smoking use included cigarettes. He started smoking about 49 years ago. He has a 21.2 pack-year smoking history. He has never used smokeless tobacco.. Discussed with patient the risks and benefits of screening, including over-diagnosis, false positive rate, and total radiation exposure.  The patient currently exhibits no signs or symptoms suggestive of lung cancer.  Discussed with patient the importance of compliance with yearly annual lung cancer screenings and willingness to undergo diagnosis and treatment if screening scan is positive.  In addition, the patient was counseled regarding the importance of remaining smoke free and/or total smoking cessation.    Also reviewed the following if the patient has Medicare that as of June 28, 2020, Medicare only covers LDCT screening in patients aged 50-77 with at least a 20 pack-year smoking history who c    Raymond Skinner (DOB:  November 16, 1955) is a 68 y.o. male, Established patient, here for evaluation of the following chief complaint(s):  3 Month Follow-Up (Patient is here today for 3 month follow up. ) and Health Maintenance (Patient due for lung screen and AAA.)         Assessment & Plan  1. Routine 62-month follow-up: Stable.  - Blood pressure readings are within the normal range. Weight remains consistent at 193 pounds.  - Continue aspirin  daily, Zocor  every night, Namenda , Aricept , D3, and K2 supplements.  -  CT scan of the lungs and abdominal ultrasound scheduled to screen for early signs of lung cancer and abdominal aneurysm.  - Complete blood count (CBC) ordered to ensure overall health.  - Lab work ordered to monitor kidney function and check for anemia.  - All medications will be sent to the pharmacy.    2. Hip fracture: Post-surgical.  - No issues reported following surgery and rehabilitation. Able to walk up and down a big hill without problems.  - No further falls reported.    Follow-up  - Follow up in 6 months for Medicare annual wellness visit.    Results    No results found for this visit on 04/08/24.    ICD-10-CM    1. Severe dementia with other behavioral disturbance, unspecified dementia type (HCC)  F03.C18 Vitamin D -Vitamin K (D3 + K2 PO)     CBC with Auto Differential     Comprehensive Metabolic Panel     Albumin/Creatinine Ratio, Urine     TSH     T4, Free     Hepatitis C Antibody      2. Recurrent falls  R29.6 CBC with Auto Differential     Comprehensive Metabolic Panel     Albumin/Creatinine Ratio, Urine     TSH     T4, Free     Hepatitis C Antibody      3. Screening for AAA (abdominal aortic aneurysm)  Z13.6 US  SCREENING FOR AAA      4. S/P total right hip  arthroplasty  Z96.641 aspirin  (BAYER ASPIRIN ) 325 MG tablet     simvastatin  (ZOCOR ) 20 MG tablet      5. Personal history of tobacco use  Z87.891 PR VISIT TO DISCUSS LUNG CA SCREEN W LDCT     CT Lung Screen (Initial/Annual/Baseline)      6. Alcohol abuse  F10.10 disulfiram  (ANTABUSE ) 250 MG tablet         Return in about 6 months (around 10/06/2024) for medicare awv and labs.       Subjective   History of Present Illness  The patient is a 68 year old male who presents for a routine 67-month follow-up.    Overall Health  - Reports overall good health and no new issues since his last visit  - Adhering to prescribed medication regimen, which includes daily aspirin , Zocor  at night, Namenda , Aricept , D3, and K2  - Discontinued the use of Antabuse  since his  stay in the nursing home  But since home care giver would like to continue as drives  and afraid will try to drink     Living Situation  - Living with his sister-in-law  - Completed rehabilitation therapy  - Returned to normal walking pattern and able to walk up and down a big hill without any problems  - Reports no recent falls, agitation, or wandering behaviors  - No leg swelling or foot sores    PAST SURGICAL HISTORY:  Hip surgery    Prior to Visit Medications   Medication Sig Taking? Authorizing Provider   Vitamin D -Vitamin K (D3 + K2 PO) Take by mouth Yes [provider]   aspirin  (BAYER ASPIRIN ) 325 MG tablet Take 1 tablet by mouth daily Yes Joshua Dorothyann SAILOR, APRN   simvastatin  (ZOCOR ) 20 MG tablet Take 1 tablet by mouth nightly Yes Joshua Dorothyann SAILOR, APRN   donepezil  (ARICEPT ) 10 MG tablet Take 1 tablet by mouth nightly Yes Joshua Dorothyann SAILOR, APRN   memantine  (NAMENDA ) 10 MG tablet Take 1 tablet by mouth 2 times daily Yes Joshua Dorothyann SAILOR, APRN   disulfiram  (ANTABUSE ) 250 MG tablet Take 1 tablet by mouth in the morning and at bedtime Yes Joshua Dorothyann SAILOR, APRN   Multiple Vitamins-Minerals (THERAPEUTIC MULTIVITAMIN-MINERALS) tablet Take 1 tablet by mouth daily Yes [provider]   cyanocobalamin  1000 MCG tablet Take 2.5 tablets by mouth daily Yes [provider]   Ginkgo 60 MG TABS Take by mouth Yes [provider]   vitamin B-6 (PYRIDOXINE ) 100 MG tablet Take 1 tablet by mouth daily  Patient not taking: Reported on 04/08/2024  [provider]        No Known Allergies    Past Medical History:   Diagnosis Date    Dementia (HCC)     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       Past Surgical History:   Procedure Laterality Date    TOTAL HIP ARTHROPLASTY Right 01/14/2024    HIP TOTAL ARTHROPLASTY performed by Dozier Gilmore DASEN, MD at [facility] OR       Family History   Problem Relation Age of Onset    Diabetes Mother     Dementia Father     No Known Problems  Maternal Uncle     Dementia Paternal Aunt     Dementia Maternal Grandmother     Dementia Maternal Grandfather     Dementia Paternal Grandmother     Dementia Paternal Grandfather  Review of Systems   Constitutional:  Positive for activity change. Negative for appetite change, fatigue and fever.   HENT:  Negative for congestion and postnasal drip.    Respiratory:  Negative for cough, shortness of breath and wheezing.    Cardiovascular:  Negative for chest pain, palpitations and leg swelling.   Gastrointestinal:  Negative for abdominal pain.   Genitourinary:  Negative for difficulty urinating.   Musculoskeletal:  Negative for arthralgias and back pain.   Skin:  Negative for rash.   Neurological:  Negative for dizziness and headaches.   Psychiatric/Behavioral:  Positive for confusion (knows self only). Negative for agitation, behavioral problems, self-injury and sleep disturbance. The patient is not nervous/anxious.           Objective   Blood pressure 120/68, pulse 66, temperature 98.7 F (37.1 C), temperature source Temporal, height 1.854 m (6' 0.99), weight 87.8 kg (193 lb 8 oz), SpO2 99%.  Physical Exam       Physical Exam  Vitals reviewed.   Constitutional:       General: He is not in acute distress.     Appearance: Normal appearance. He is not ill-appearing.   HENT:      Head: Normocephalic.      Right Ear: Tympanic membrane normal. There is no impacted cerumen.      Left Ear: Tympanic membrane normal. There is no impacted cerumen.      Nose: No rhinorrhea.      Mouth/Throat:      Pharynx: No posterior oropharyngeal erythema.   Eyes:      General:         Right eye: No discharge.         Left eye: No discharge.   Cardiovascular:      Rate and Rhythm: Normal rate and regular rhythm.      Pulses: Normal pulses.      Heart sounds: No murmur heard.  Pulmonary:      Effort: Pulmonary effort is normal.      Breath sounds: Normal breath sounds. No wheezing or rhonchi.   Skin:     Findings: No rash.    Neurological:      General: No focal deficit present.      Mental Status: He is alert and oriented to person, place, and time.   Psychiatric:         Mood and Affect: Mood normal.         Behavior: Behavior normal.      Comments: Doesn't know anyone but self and his care taker              The patient (or guardian, if applicable) and other individuals in attendance with the patient were advised that Artificial Intelligence will be utilized during this visit to record, process the conversation to generate a clinical note and to support improvement of the AI technology. The patient (or guardian, if applicable) and other individuals in attendance at the appointment consented to the use of AI, including the recording.      An electronic signature was used to authenticate this note.    --Dorothyann LOISE Molt, APRN urrently smoke or have quit in the last 15 years  "

## 2024-04-13 ENCOUNTER — Inpatient Hospital Stay: Admit: 2024-04-13 | Discharge: 2024-04-13 | Payer: Medicare (Managed Care) | Primary: Family

## 2024-04-13 DIAGNOSIS — Z87891 Personal history of nicotine dependence: Principal | ICD-10-CM

## 2024-04-13 DIAGNOSIS — Z136 Encounter for screening for cardiovascular disorders: Principal | ICD-10-CM

## 2024-04-21 ENCOUNTER — Inpatient Hospital Stay
Admission: EM | Admit: 2024-04-21 | Discharge: 2024-04-25 | Disposition: A | Discharge status: Skilled Nursing Facility | Payer: Medicare (Managed Care) | Admitting: Internal Medicine

## 2024-04-21 ENCOUNTER — Emergency Department: Admit: 2024-04-21 | Payer: Medicare (Managed Care) | Primary: Family

## 2024-04-21 DIAGNOSIS — M6282 Rhabdomyolysis: Secondary | ICD-10-CM

## 2024-04-21 DIAGNOSIS — R4182 Altered mental status, unspecified: Principal | ICD-10-CM

## 2024-04-21 LAB — CBC WITH AUTO DIFFERENTIAL
Basophils %: 0.1 % (ref 0.0–1.0)
Basophils Absolute: 0 K/uL (ref 0.00–0.20)
Eosinophils %: 0 % (ref 0.0–5.0)
Eosinophils Absolute: 0 K/uL (ref 0.00–0.60)
Hematocrit: 49.6 % (ref 42.0–52.0)
Hemoglobin: 15.9 g/dL (ref 14.0–18.0)
Immature Granulocytes #: 0 K/uL
Lymphocytes %: 7.7 % — ABNORMAL LOW (ref 20.0–40.0)
Lymphocytes Absolute: 0.6 K/uL — ABNORMAL LOW (ref 1.1–4.5)
MCH: 28.5 pg (ref 27.0–31.0)
MCHC: 32.1 g/dL — ABNORMAL LOW (ref 33.0–37.0)
MCV: 89 fL (ref 80.0–94.0)
MPV: 10 fL (ref 9.4–12.4)
Monocytes %: 4.8 % (ref 0.0–10.0)
Monocytes Absolute: 0.4 K/uL (ref 0.00–0.90)
Neutrophils %: 87 % — ABNORMAL HIGH (ref 50.0–65.0)
Neutrophils Absolute: 7.3 K/uL (ref 1.5–7.5)
Platelets: 244 K/uL (ref 130–400)
RBC: 5.57 M/uL (ref 4.70–6.10)
RDW: 13.5 % (ref 11.5–14.5)
WBC: 8.3 K/uL (ref 4.8–10.8)

## 2024-04-21 LAB — COMPREHENSIVE METABOLIC PANEL
ALT: 44 U/L (ref 10–50)
AST: 125 U/L — ABNORMAL HIGH (ref 10–50)
Albumin: 4.8 g/dL (ref 3.5–5.2)
Alkaline Phosphatase: 84 U/L (ref 40–129)
Anion Gap: 21 mmol/L — ABNORMAL HIGH (ref 8–16)
BUN: 17 mg/dL (ref 8–23)
CO2: 23 mmol/L (ref 22–29)
Calcium: 9.5 mg/dL (ref 8.8–10.2)
Chloride: 101 mmol/L (ref 98–107)
Creatinine: 1.1 mg/dL (ref 0.7–1.2)
Est, Glom Filt Rate: 74 (ref >60)
Glucose: 164 mg/dL — ABNORMAL HIGH (ref 70–99)
Potassium: 4.7 mmol/L (ref 3.5–5.1)
Sodium: 145 mmol/L (ref 136–145)
Total Bilirubin: <0.2 mg/dL (ref 0.2–1.2)
Total Protein: 7.2 g/dL (ref 6.4–8.3)

## 2024-04-21 LAB — MICROSCOPIC URINALYSIS

## 2024-04-21 LAB — DRUG SCRN, BUPRENORPHINE
Amphetamine Screen, Ur: NEGATIVE (ref <500)
Barbiturate Screen, Ur: NEGATIVE (ref <200)
Benzodiazepine Screen, Urine: NEGATIVE (ref <150)
Buprenorphine Urine: NEGATIVE (ref <10)
Cannabinoid Scrn, Ur: POSITIVE — AB (ref <50)
Cocaine Metabolite Screen, Urine: NEGATIVE (ref <150)
FENTANYL SCREEN, URINE: NEGATIVE (ref <5)
Methadone Screen, Urine: NEGATIVE (ref <200)
Methamphetamine, Urine: NEGATIVE (ref <500)
Opiate Screen, Urine: NEGATIVE (ref <100)
Oxycodone Urine: NEGATIVE (ref <100)
Phencyclidine (PCP), Screen, Urine: NEGATIVE (ref <25)
Tricyclic Antidepressants, Urine: NEGATIVE (ref <300)

## 2024-04-21 LAB — POCT GLUCOSE: POC Glucose: 212 mg/dL — ABNORMAL HIGH (ref 70–99)

## 2024-04-21 LAB — CK: Total CK: 6938 U/L — ABNORMAL HIGH (ref 39–308)

## 2024-04-21 LAB — URINALYSIS WITH REFLEX TO CULTURE
Bilirubin, Urine: NEGATIVE
Glucose, Ur: 500 mg/dL — AB
Leukocyte Esterase, Urine: NEGATIVE
Nitrite, Urine: NEGATIVE
Protein, UA: 100 mg/dL — AB
Specific Gravity, UA: 1.019 (ref 1.005–1.030)
Urobilinogen, Urine: 1 U/dL (ref <2.0)
pH, Urine: 5 (ref 5.0–8.0)

## 2024-04-21 LAB — HEMOGLOBIN A1C: Hemoglobin A1C: 6.2 % — ABNORMAL HIGH (ref 4.0–5.6)

## 2024-04-21 LAB — ETHANOL: Ethanol Lvl: 266 mg/dL — ABNORMAL HIGH (ref 0–11)

## 2024-04-21 MED ORDER — THIAMINE HCL 100 MG/ML IJ SOLN
100 | Freq: Once | INTRAMUSCULAR | Status: AC
Start: 2024-04-21 — End: 2024-04-21
  Administered 2024-04-22: 01:00:00 100 mg via INTRAVENOUS

## 2024-04-21 MED ORDER — CEFTRIAXONE SODIUM 1 G IJ SOLR
1 | INTRAMUSCULAR | Status: AC
Start: 2024-04-21 — End: 2024-04-23
  Administered 2024-04-22 – 2024-04-23 (×3): 1000 mg via INTRAVENOUS

## 2024-04-21 MED ORDER — SODIUM CHLORIDE 0.9 % IV BOLUS
0.9 | Freq: Once | INTRAVENOUS | Status: AC
Start: 2024-04-21 — End: 2024-04-21
  Administered 2024-04-22: 01:00:00 1000 mL via INTRAVENOUS

## 2024-04-21 NOTE — ED Provider Notes (Signed)
 "[facility] EMERGENCY DEPARTMENT  eMERGENCY dEPARTMENT eNCOUnter      [name]  MRN: [id number]  Birthdate 1956-01-19  Date of evaluation: 04/21/2024  Provider: Venetia Corner, MD    CHIEF COMPLAINT       Chief Complaint   Patient presents with    Altered Mental Status     Pt presents to ED with AMS LKW yesterday afternoon. Possible ETOH pt responsive to pain.     Alcohol Intoxication         HISTORY OF PRESENT ILLNESS   (Location/Symptom, Timing/Onset,Context/Setting, Quality, Duration, Modifying Factors, Severity)  Note limiting factors.   Raymond Skinner is a 68 y.o. male who presents to the emergency department via EMS for evaluation regarding altered mental status and alcohol intoxication.  Additional history obtained from patient's family who notes that yesterday they found him laying in the floor and due to his confusion and presumed intoxication they were unable to get him up out of the floor.  Today they stated he was still in the floor and he seemed to be less alert and they called an ambulance.  Patient has a prior history of chronic alcoholism and a prior diagnosis of severe dementia according to review of prior records.  Upon arrival to ED patient is altered and not able to provide any relevant history.      HPI    NursingNotes were reviewed.    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    HIP TOTAL ARTHROPLASTY performed by Dozier Gilmore DASEN, MD at [facility] OR         CURRENT MEDICATIONS     Previous Medications    ASPIRIN  (BAYER ASPIRIN ) 325 MG TABLET    Take 1 tablet by mouth daily    CYANOCOBALAMIN  1000 MCG TABLET    Take 2.5 tablets by mouth daily     DISULFIRAM  (ANTABUSE ) 250 MG TABLET    Take 1 tablet by mouth in the morning and at bedtime    DONEPEZIL  (ARICEPT ) 10 MG TABLET    Take 1 tablet by mouth nightly    GINKGO 60 MG TABS    Take by mouth    MEMANTINE  (NAMENDA ) 10 MG TABLET    Take 1 tablet by mouth 2 times daily    MULTIPLE VITAMINS-MINERALS (THERAPEUTIC MULTIVITAMIN-MINERALS) TABLET    Take 1 tablet by mouth daily    SIMVASTATIN  (ZOCOR ) 20 MG TABLET    Take 1 tablet by mouth nightly    VITAMIN B-6 (PYRIDOXINE ) 100 MG TABLET    Take 1 tablet by mouth daily    VITAMIN D -VITAMIN K (D3 + K2 PO)    Take by mouth       ALLERGIES     Patient has no known allergies.    FAMILY HISTORY       Family History   Problem Relation Age of Onset    Diabetes Mother  Dementia Father     No Known Problems Maternal Uncle     Dementia Paternal Aunt     Dementia Maternal Grandmother     Dementia Maternal Grandfather     Dementia Paternal Grandmother     Dementia Paternal Grandfather           SOCIAL HISTORY       Social History     Socioeconomic History    Marital status: Divorced   Tobacco Use    Smoking status: Former     Current packs/day: 0.00     Average packs/day: 0.5 packs/day for 42.4 years (21.2 ttl pk-yrs)     Types: Cigarettes     Start date: 12/31/1974     Quit date: 2019     Years since quitting: 6.9    Smokeless tobacco: Never   Vaping Use    Vaping status: Never Used   Substance and Sexual Activity    Alcohol use: Never    Drug use: Yes     Types: Marijuana Oda)   Social History Narrative    CODE STATUS: Full Code    HEALTH CARE PROXY: Mrs. [employer], a relative, [phone]    DOMICILED: with family at the bedside but planning for a nursing home     Social Drivers of Health     Financial Resource Strain: Low Risk  (11/05/2022)    Overall Financial Resource Strain (CARDIA)     Difficulty of Paying Living Expenses: Not hard at all   Food Insecurity: No Food Insecurity (02/29/2024)    Hunger Vital Sign     Worried About Running Out of Food in the Last  Year: Never true     Ran Out of Food in the Last Year: Never true   Transportation Needs: No Transportation Needs (02/29/2024)    PRAPARE - Therapist, Art (Medical): No     Lack of Transportation (Non-Medical): No   Physical Activity: Insufficiently Active (01/08/2024)    Exercise Vital Sign     Days of Exercise per Week: 5 days     Minutes of Exercise per Session: 10 min   Housing Stability: Low Risk  (02/29/2024)    Housing Stability Vital Sign     Unable to Pay for Housing in the Last Year: No     Number of Times Moved in the Last Year: 0     Homeless in the Last Year: No       SCREENINGS    Glasgow Coma Scale  Eye Opening: To pain  Best Verbal Response: Confused  Best Motor Response: Withdraws from pain  Glasgow Coma Scale Score: 10        PHYSICAL EXAM    (up to 7 for level 4, 8 or more for level 5)     ED Triage Vitals   BP Girls Systolic BP Percentile Girls Diastolic BP Percentile Boys Systolic BP Percentile Boys Diastolic BP Percentile Temp Temp Source Pulse   04/21/24 1205 -- -- -- -- 04/21/24 1202 04/21/24 1202 04/21/24 1202   (!) 169/82     98.2 F (36.8 C) Oral 100      Respirations SpO2 Height Weight - Scale       04/21/24 1202 04/21/24 1202 04/21/24 1202 04/21/24 1202       16 100 % 1.829 m (6') 87.5 kg (193 lb)           Physical Exam  Vitals and nursing note reviewed.  HENT:      Head: Atraumatic.      Mouth/Throat:      Mouth: Mucous membranes are moist. Mucous membranes are not dry.   Eyes:      General: No scleral icterus.     Pupils: Pupils are equal, round, and reactive to light.   Neck:      Trachea: No tracheal deviation.   Cardiovascular:      Rate and Rhythm: Normal rate and regular rhythm.      Pulses: Normal pulses.      Heart sounds: Normal heart sounds. No murmur heard.  Pulmonary:      Effort: Pulmonary effort is normal. No respiratory distress.      Breath sounds: Normal breath sounds. No stridor.   Abdominal:      General: There is no distension.       Palpations: Abdomen is soft.      Tenderness: There is no abdominal tenderness. There is no guarding.   Musculoskeletal:         General: No deformity or signs of injury.   Skin:     Coloration: Skin is not pale.      Findings: No rash.   Neurological:      Mental Status: He is disoriented.      Comments: Patient is obtunded.  He does seem to attempt to open his eyes to voice however he does not communicate verbally here in the ED.  Seems to withdraw to noxious stimuli with no observed focal motor deficits or obvious facial droop noted.   Psychiatric:         Behavior: Behavior is cooperative.         DIAGNOSTIC RESULTS       RADIOLOGY:  Non-plain film images such as CT, Ultrasound and MRI are read by the radiologist. Plain radiographic images are visualized and preliminarily interpreted bythe emergency physician with the below findings:      CT HEAD WO CONTRAST   Final Result       1. No acute intracranial abnormality.   2. 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           _________________________________________        All CT scans are performed using dose optimization techniques as appropriate to the performed exam and include    at least one of the following: Automated exposure control, adjustment of the mA and/or kV according to size, and the use of iterative reconstruction technique.        ______________________________________    Electronically signed by: OZELL RICKER M.D.   Date:     04/21/2024   Time:    13:30               LABS:  Labs Reviewed   CBC WITH AUTO DIFFERENTIAL - Abnormal; Notable for the following components:       Result Value    MCHC 32.1 (*)     Neutrophils % 87.0 (*)     Lymphocytes % 7.7 (*)     Lymphocytes Absolute 0.6 (*)     All other components within normal limits   COMPREHENSIVE METABOLIC PANEL - Abnormal; Notable for the following components:    Anion Gap 21 (*)     Glucose 164 (*)     AST 125 (*)     All other components within  normal limits   ETHANOL - Abnormal; Notable for the following components:    Ethanol  Lvl 266 (*)     All other components within normal limits   URINALYSIS WITH REFLEX TO CULTURE - Abnormal; Notable for the following components:    Clarity, UA CLOUDY (*)     Glucose, Ur 500 (*)     Blood, Urine LARGE (*)     Protein, UA 100 (*)     All other components within normal limits   DRUG SCRN, BUPRENORPHINE - Abnormal; Notable for the following components:    Cannabinoid Scrn, Ur POSITIVE (*)     All other components within normal limits   MICROSCOPIC URINALYSIS - Abnormal; Notable for the following components:    Fatty Casts, UA 1-2 (*)     RBC, UA 6-10 (*)     Bacteria, UA 4+ (*)     All other components within normal limits   CK - Abnormal; Notable for the following components:    Total CK 6,938 (*)     All other components within normal limits   POCT GLUCOSE - Abnormal; Notable for the following components:    POC Glucose 212 (*)     All other components within normal limits       All other labs were within normal range or not returned as of this dictation.    EMERGENCY DEPARTMENT COURSE and DIFFERENTIAL DIAGNOSIS/MDM:   Vitals:    Vitals:    04/21/24 1330 04/21/24 1400 04/21/24 1415 04/21/24 1445   BP:       Pulse: 84 89 91 90   Resp: 12 14 24 11    Temp:       TempSrc:       SpO2: 100% 98% 98% 99%   Weight:       Height:           MDM    I have independently reviewed patient's laboratory studies, CT imaging performed while present in the emergency department.  Serum alcohol is elevated at 266.  His CK is also elevated at 6900.  CT imaging of the brain is unremarkable for any acute findings other than age-related atrophy.  Electrolytes look okay with a normal creatinine.  Patient's vital signs have remained stable.  He remains obtunded here in the ED and still has not been able to communicate verbally.  ED case management team spoken with patient's family who states they are not able to care for him at home.  Apparently  they have been trying to get him into a nursing home as they do not feel he is safe at home.    CONSULTS:    Case was discussed with the hospitalist team Kandra) regarding plans for admission and further evaluation and management.      Unless otherwise noted below, none     Procedures    FINAL IMPRESSION      1. Altered mental status, unspecified altered mental status type    2. Acute alcoholic intoxication with delirium    3. History of dementia    4. Unable to care for self          DISPOSITION/PLAN   DISPOSITION Decision To Admit 04/21/2024 04:48:21 PM   DISPOSITION CONDITION Stable       (Please note that portions of this note were completed with a voice recognition program.  Efforts were made to edit the dictations but occasionally words are mis-transcribed.)    Venetia Corner, MD (electronically signed)Emergency Physician          Corner Venetia, MD  04/21/24 1801    "

## 2024-04-21 NOTE — Case Communication (Signed)
"  SW spoke with pt sister in law, [place] via phone.  She reports pt. Needs to go to [facility] SW to send a referral in careport.    Phone: [phone]  Fax: [phone]  Admissions Fax: [phone]  Electronically signed by Tobias LITTIE Che on 04/21/2024 at 4:14 PM   "

## 2024-04-21 NOTE — ED Notes (Signed)
 "ED TO INPATIENT SBAR HANDOFF    Patient Name: Raymond Skinner   DOB: [birth year]  68 y.o.   Family/Caregiver Present: No  Code Status Order: Full Code    C-SSRS: Risk of Suicide: No Risk  Sitter No  Restraints:         Situation  Chief Complaint:   Chief Complaint   Patient presents with    Altered Mental Status     Pt presents to ED with AMS LKW yesterday afternoon. Possible ETOH pt responsive to pain.     Alcohol Intoxication     Patient Diagnosis: Rhabdomyolysis [M62.82]     Brief Description of Patient's Condition: pt was found on floor last night by family, unable to get him up. They checked on him again today and pt was still on floor. Pt has issues with alcohol and family assumed he was intoxicated. Pt in fact has an elevated ethanol. Along with the alcoholism, pts also dx with dementia. This nurse took over care when pt threw up all over in one of the hallway beds. Pt has not had any issues since being cleaned. Pt has slept the entire time and hard to arouse.   Mental Status: sleeping   Arrived from: home    Imaging:   CT HEAD WO CONTRAST   Final Result       1. No acute intracranial abnormality.   2. 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           _________________________________________        All CT scans are performed using dose optimization techniques as appropriate to the performed exam and include    at least one of the following: Automated exposure control, adjustment of the mA and/or kV according to size, and the use of iterative reconstruction technique.        ______________________________________    Electronically signed by: OZELL RICKER M.D.   Date:     04/21/2024   Time:    13:30         COVID-19 Results:   Internal Administration   First Dose      Second Dose           Last COVID Lab No results found for: SARS-COV-2        Abnormal labs:   Abnormal Labs Reviewed   CBC WITH AUTO DIFFERENTIAL - Abnormal; Notable for the following components:        Result Value    MCHC 32.1 (*)     Neutrophils % 87.0 (*)     Lymphocytes % 7.7 (*)     Lymphocytes Absolute 0.6 (*)     All other components within normal limits   COMPREHENSIVE METABOLIC PANEL - Abnormal; Notable for the following components:    Anion Gap 21 (*)     Glucose 164 (*)     AST 125 (*)     All other components within normal limits   ETHANOL - Abnormal; Notable for the following components:    Ethanol Lvl 266 (*)     All other components within normal limits   URINALYSIS WITH REFLEX TO CULTURE - Abnormal; Notable for the following components:    Clarity, UA CLOUDY (*)     Glucose, Ur 500 (*)     Blood, Urine LARGE (*)     Protein, UA 100 (*)     All other components within normal limits   DRUG SCRN, BUPRENORPHINE - Abnormal; Notable for  the following components:    Cannabinoid Scrn, Ur POSITIVE (*)     All other components within normal limits   MICROSCOPIC URINALYSIS - Abnormal; Notable for the following components:    Fatty Casts, UA 1-2 (*)     RBC, UA 6-10 (*)     Bacteria, UA 4+ (*)     All other components within normal limits   CK - Abnormal; Notable for the following components:    Total CK 6,938 (*)     All other components within normal limits   HEMOGLOBIN A1C - Abnormal; Notable for the following components:    Hemoglobin A1C 6.2 (*)     All other components within normal limits   POCT GLUCOSE - Abnormal; Notable for the following components:    POC Glucose 212 (*)     All other components within normal limits     Background  Allergies: No Known Allergies  Current Medications:   Medications Administered         cefTRIAXone  (ROCEPHIN ) 1,000 mg in sterile water  10 mL IV syringe Admin Date  04/21/2024 Action  Given Dose  1,000 mg Rate   Route  IntraVENous Documented By  Maxene Leader, RN        sodium chloride  0.9 % bolus 1,000 mL Admin Date  04/21/2024 Action  New Bag Dose  1,000 mL Rate  495.9 mL/hr Route  IntraVENous Documented By  Maxene Leader, RN        thiamine  (B-1) injection 100  mg Admin Date  04/21/2024 Action  Given Dose  100 mg Rate   Route  IntraVENous Documented By  Maxene Leader, RN            History:   Past Medical History:   Diagnosis Date    Dementia (HCC)     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       Assessment  Vitals: Level of Consciousness: Alert (0)   Vitals:    04/21/24 1400 04/21/24 1415 04/21/24 1445 04/21/24 2100   BP:    126/65   Pulse: 89 91 90 (!) 104   Resp: 14 24 11     Temp:       TempSrc:       SpO2: 98% 98% 99% 96%   Weight:       Height:         Predictive Model Details          57 (Warning)  Factor Value    Calculated 04/21/2024 21:39 37% Glasgow coma scale 10    Deterioration Index Model 21% Respiratory rate 11     12% Age 6 years old     9% Sodium 145 mmol/L     6% Potassium 4.7 mmol/L     5% Pulse 104     2% Systolic 126     1% WBC count 8.3 K/uL     0% Hematocrit 49.6 %     0% Pulse oximetry 96 %     0% Temperature 98.2 F (36.8 C)       NPO? No  O2 Flow Rate:      Cardiac Rhythm: sinus tach  NIH Score: NIH     Active LDA's:   Peripheral IV 04/21/24 Left;Anterior Hand (Active)     Pertinent or High Risk Medications/Drips: no   If Yes, please provide details: n/a  Blood Product Administration: no  If Yes, please provide details: n/a  Sepsis Risk Score  Admitted with Sepsis? No    Recommendation  Incomplete orders: floor  Patient Belongings: w/pt  Additional Comments: none  If any further questions, please call Sending RN at [phone]    Electronically signed by: Electronically signed by MARJORIE GREGO, RN on 04/21/2024 at 9:39 PM   "

## 2024-04-21 NOTE — H&P (Signed)
 "  [address], [place], [place] [address]    DEPARTMENT OF HOSPITALIST MEDICINE        HISTORY & PHYSICAL:          REASON FOR ADMISSION:  Chief Complaint   Patient presents with    Altered Mental Status     Pt presents to ED with AMS LKW yesterday afternoon. Possible ETOH pt responsive to pain.     Alcohol Intoxication        HISTORY OF PRESENT ILLNESS:  Raymond Skinner is an 68 y.o. male.  With past medical history of alcohol abuse, dementia, cannabis use.  Patient was found in the floor yesterday by family at and was presumed intoxicated.  They tried to get him to wake up and he would not get up off the floor so he was left there overnight.  Today they found him still on the floor and less alert and EMS was called.  ER eval blood alcohol is 266, sodium 145 potassium 4.7 BUN 17 creatinine 1.1 glucose 212 CK 6938 AST 125 urine drug screen positive for cannabis.  Urinalysis 4+ bacteria and that is a cath specimen.  CT head with no acute intracranial abnormality.  He has involutional change of the brain and suspected sequela of microvascular disease.  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    HIP TOTAL ARTHROPLASTY performed by Dozier Gilmore DASEN, MD at [facility] OR        SOCIAL HISTORY:  Social History     Socioeconomic History    Marital status: Divorced   Tobacco Use    Smoking status: Former     Current packs/day: 0.00     Average packs/day: 0.5 packs/day for 42.4 years (21.2 ttl pk-yrs)     Types: Cigarettes     Start date: 12/31/1974     Quit date: 2019     Years since quitting: 6.9    Smokeless tobacco: Never   Vaping Use    Vaping status: Never Used   Substance and Sexual Activity    Alcohol use: Never    Drug use: Yes     Types: Marijuana Oda)   Social History  Narrative    CODE STATUS: Full Code    HEALTH CARE PROXY: Mrs. [employer], a relative, [phone]    DOMICILED: with family at the bedside but planning for a nursing home     Social Drivers of Health     Financial Resource Strain: Low Risk  (11/05/2022)    Overall Financial Resource Strain (CARDIA)     Difficulty of Paying Living Expenses: Not hard at all   Food Insecurity: No Food Insecurity (02/29/2024)    Hunger Vital Sign     Worried About Running Out of Food in the Last Year: Never true     Ran Out of Food in the Last Year: Never true   Transportation Needs: No Transportation Needs (02/29/2024)    PRAPARE - Therapist, Art (Medical): No     Lack of Transportation (Non-Medical): No   Physical Activity: Insufficiently Active (01/08/2024)    Exercise Vital Sign  Days of Exercise per Week: 5 days     Minutes of Exercise per Session: 10 min   Housing Stability: Low Risk  (02/29/2024)    Housing Stability Vital Sign     Unable to Pay for Housing in the Last Year: No     Number of Times Moved in the Last Year: 0     Homeless in the Last Year: No        FAMILY HISTORY:  Family History   Problem Relation Age of Onset    Diabetes Mother     Dementia Father     No Known Problems Maternal Uncle     Dementia Paternal Aunt     Dementia Maternal Grandmother     Dementia Maternal Grandfather     Dementia Paternal Grandmother     Dementia Paternal Grandfather          ALLERGIES:  No Known Allergies     PRIOR TO ADMISSION MEDS:  Not in a hospital admission.         PHYSICAL EXAM:  BP 121/68   Pulse 90   Temp 98.2 F (36.8 C) (Oral)   Resp 11   Ht 1.829 m (6')   Wt 87.5 kg (193 lb)   SpO2 99%   BMI 26.18 kg/m   No intake/output data recorded.      PHYSICAL EXAMINATION:    Vital Signs: Please see the chart   GPE:  Drowsy will arouse to deep noxious stimulation, patient appears tired and fatigued   Head/Eyes:  Normocephalic, atraumatic, EOMI and PERRLA bilaterally   ENT: Moist mucous  membranes, nasal passages clear   Neck: Supple, full range of motion, no carotid bruit, trachea midline   Respiratory:   Bilateral fair air entry in both lung fields, mild B/L crackles, symmetric expansion of chest   Cardiovascular:  Regular rate and rhythm, S1+S2+0, no murmurs/rubs   Urology: No bilateral CVA tenderness, no suprapubic tenderness   Abdomen:   Soft, non-tender, bowel sounds +ve, no organomegaly, vomiting in the ED, moderate formed bowel movement   Muscle/Joints: Moves all, full range of motion, no muscle spasms   Extremities: No clubbing, no cyanosis, no calf tenderness, no edema   Pulses: 2+ bilaterally, symmetrical   Skin: Warm, dry, no pallor/cyanosis/jaundice, no rashes/lesions   Neurologic: Smells of EtOH, does not follow commands   Psychiatric: Unable to assess         LABORATORY DATA:    CBC:  Recent Labs     04/21/24  1211   WBC 8.3   HGB 15.9   HCT 49.6   PLT 244     BMP:  Recent Labs     04/21/24  1211   NA 145   K 4.7   CL 101   CO2 23   BUN 17   CREATININE 1.1   CALCIUM  9.5     Recent Labs     04/21/24  1211   AST 125*   ALT 44   BILITOT <0.2   ALKPHOS 84     Coag Panel: No results for input(s): INR, PROTIME, APTT in the last 72 hours.  Cardiac Enzymes:   Recent Labs     04/21/24  1211   CKTOTAL 6,938*     ABGs:No results found for: PHART, PO2ART, PCO2ART  Urinalysis:  Lab Results   Component Value Date/Time    NITRU Negative 04/21/2024 12:35 PM    WBCUA 6-9 04/21/2024 12:35 PM    BACTERIA 4+  04/21/2024 12:35 PM    RBCUA 6-10 04/21/2024 12:35 PM    BLOODU LARGE 04/21/2024 12:35 PM    GLUCOSEU 500 04/21/2024 12:35 PM     A1C: No results for input(s): LABA1C in the last 72 hours.  ABG:No results for input(s): PHART, PCO2ART, PO2ART, HCO3ART, BEART, HGBAE, O2SATART, CARBOXHGBART in the last 72 hours.    EKG:   Please see chart      IMAGING:  CT HEAD WO CONTRAST  Result Date: 04/21/2024   1. No acute intracranial abnormality. 2. Involutional change of the brain  and suspected sequelae of microvascular disease.   _________________________________________  All CT scans are performed using dose optimization techniques as appropriate to the performed exam and include at least one of the following: Automated exposure control, adjustment of the mA and/or kV according to size, and the use of iterative reconstruction technique.  ______________________________________ Electronically signed by: OZELL RICKER M.D. Date:     04/21/2024 Time:    13:30         Assessment and Plan:    Principal Problem:    Rhabdomyolysis  Active Problems:    Dementia (HCC)    Alcohol abuse    Cannabis abuse    Urinary tract infection  Resolved Problems:    * No resolved hospital problems. *     Principal Problem:    Rhabdomyolysis   Normal saline at 125   CK repeat in a.m.   Monitor renal function   Hold statin home med  Active Problems:    Dementia (HCC)   Cog eval   Social worker for placement    Alcohol abuse   Repeat blood alcohol in a.m.  CIWA protocol    Thiamine  daily  Seizure precaution     Cannabis abuse   Noted    Urinary tract infection   Culture pending  Rocephin  1 g every 24 x 3   Bladder scan every 4 hours  Resolved Problems:    * No resolved hospital problems. *    DVT prophylaxis Lovenox     GI prophylaxis-Protonix     Disposition to be determined family can no longer care at home so social worker consulted for placement  Vina GORMAN Lance, APRN - CNP  6:21 PM 04/21/2024      DISCLAIMER: This note was created with electronic voice recognition which does have occasional errors.  If you have any questions regarding the content within the note please do not hesitate to contact me... Thanks.    "

## 2024-04-22 LAB — CBC WITH AUTO DIFFERENTIAL
Basophils %: 0.1 % (ref 0.0–1.0)
Basophils Absolute: 0 K/uL (ref 0.00–0.20)
Eosinophils %: 0.1 % (ref 0.0–5.0)
Eosinophils Absolute: 0 K/uL (ref 0.00–0.60)
Hematocrit: 50.5 % (ref 42.0–52.0)
Hemoglobin: 17.1 g/dL (ref 14.0–18.0)
Immature Granulocytes #: 0 K/uL
Lymphocytes %: 13.2 % — ABNORMAL LOW (ref 20.0–40.0)
Lymphocytes Absolute: 1.3 K/uL (ref 1.1–4.5)
MCH: 29 pg (ref 27.0–31.0)
MCHC: 33.9 g/dL (ref 33.0–37.0)
MCV: 85.6 fL (ref 80.0–94.0)
MPV: 10.1 fL (ref 9.4–12.4)
Monocytes %: 6.2 % (ref 0.0–10.0)
Monocytes Absolute: 0.6 K/uL (ref 0.00–0.90)
Neutrophils %: 80.2 % — ABNORMAL HIGH (ref 50.0–65.0)
Neutrophils Absolute: 7.8 K/uL — ABNORMAL HIGH (ref 1.5–7.5)
Platelets: 202 K/uL (ref 130–400)
RBC: 5.9 M/uL (ref 4.70–6.10)
RDW: 13.5 % (ref 11.5–14.5)
WBC: 9.7 K/uL (ref 4.8–10.8)

## 2024-04-22 LAB — COMPREHENSIVE METABOLIC PANEL W/ REFLEX TO MG FOR LOW K
ALT: 90 U/L — ABNORMAL HIGH (ref 10–50)
AST: 342 U/L — ABNORMAL HIGH (ref 10–50)
Albumin: 3.8 g/dL (ref 3.5–5.2)
Alkaline Phosphatase: 83 U/L (ref 40–129)
Anion Gap: 15 mmol/L (ref 8–16)
BUN: 16 mg/dL (ref 8–23)
CO2: 22 mmol/L (ref 22–29)
Calcium: 8.7 mg/dL — ABNORMAL LOW (ref 8.8–10.2)
Chloride: 106 mmol/L (ref 98–107)
Creatinine: 0.9 mg/dL (ref 0.7–1.2)
Est, Glom Filt Rate: >90 (ref >60)
Glucose: 107 mg/dL — ABNORMAL HIGH (ref 70–99)
Potassium reflex Magnesium: 4.6 mmol/L (ref 3.5–5.0)
Sodium: 143 mmol/L (ref 136–145)
Total Bilirubin: 0.3 mg/dL (ref 0.2–1.2)
Total Protein: 6.7 g/dL (ref 6.4–8.3)

## 2024-04-22 LAB — TSH: TSH: 1.08 u[IU]/mL (ref 0.270–4.200)

## 2024-04-22 LAB — CK: Total CK: 12917 U/L — ABNORMAL HIGH (ref 39–308)

## 2024-04-22 LAB — ETHANOL: Ethanol Lvl: 51 mg/dL — ABNORMAL HIGH (ref 0–11)

## 2024-04-22 MED ORDER — LORAZEPAM 1 MG PO TABS
1 | ORAL | Status: DC | PRN
Start: 2024-04-22 — End: 2024-04-25

## 2024-04-22 MED ORDER — LORAZEPAM 2 MG/ML IJ SOLN
2 | INTRAMUSCULAR | Status: DC | PRN
Start: 2024-04-22 — End: 2024-04-25

## 2024-04-22 MED ORDER — TAMSULOSIN HCL 0.4 MG PO CAPS
0.4 | Freq: Every day | ORAL | Status: DC
Start: 2024-04-22 — End: 2024-04-25
  Administered 2024-04-22 – 2024-04-25 (×4): 0.8 mg via ORAL

## 2024-04-22 MED ORDER — POTASSIUM CHLORIDE 10 MEQ/100ML IV SOLN
10 | INTRAVENOUS | Status: DC | PRN
Start: 2024-04-22 — End: 2024-04-25

## 2024-04-22 MED ORDER — PANTOPRAZOLE SODIUM 40 MG IV SOLR
40 | Freq: Every day | INTRAVENOUS | Status: DC
Start: 2024-04-22 — End: 2024-04-25
  Administered 2024-04-22 – 2024-04-25 (×5): 40 mg via INTRAVENOUS

## 2024-04-22 MED ORDER — ENOXAPARIN SODIUM 40 MG/0.4ML IJ SOSY
40 | Freq: Every day | INTRAMUSCULAR | Status: DC
Start: 2024-04-22 — End: 2024-04-25
  Administered 2024-04-22 – 2024-04-24 (×3): 40 mg via SUBCUTANEOUS

## 2024-04-22 MED ORDER — NORMAL SALINE FLUSH 0.9 % IV SOLN
0.9 | INTRAVENOUS | Status: DC | PRN
Start: 2024-04-22 — End: 2024-04-25

## 2024-04-22 MED ORDER — ONDANSETRON 4 MG PO TBDP
4 | Freq: Three times a day (TID) | ORAL | Status: DC | PRN
Start: 2024-04-22 — End: 2024-04-25

## 2024-04-22 MED ORDER — DONEPEZIL HCL 10 MG PO TABS
10 | Freq: Every evening | ORAL | Status: DC
Start: 2024-04-22 — End: 2024-04-25
  Administered 2024-04-23 – 2024-04-25 (×3): 10 mg via ORAL

## 2024-04-22 MED ORDER — SODIUM CHLORIDE 0.9 % IV SOLN
0.9 | INTRAVENOUS | Status: AC
Start: 2024-04-22 — End: 2024-04-23
  Administered 2024-04-22 – 2024-04-23 (×5): via INTRAVENOUS

## 2024-04-22 MED ORDER — ACETAMINOPHEN 650 MG RE SUPP
650 | Freq: Four times a day (QID) | RECTAL | Status: DC | PRN
Start: 2024-04-22 — End: 2024-04-25

## 2024-04-22 MED ORDER — POLYETHYLENE GLYCOL 3350 17 G PO PACK
17 | Freq: Every day | ORAL | Status: DC | PRN
Start: 2024-04-22 — End: 2024-04-25

## 2024-04-22 MED ORDER — ACETAMINOPHEN 325 MG PO TABS
325 | Freq: Four times a day (QID) | ORAL | Status: DC | PRN
Start: 2024-04-22 — End: 2024-04-25

## 2024-04-22 MED ORDER — THIAMINE HCL 100 MG/ML IJ SOLN
100 | Freq: Every day | INTRAMUSCULAR | Status: DC
Start: 2024-04-22 — End: 2024-04-25
  Administered 2024-04-22 – 2024-04-25 (×4): 100 mg via INTRAVENOUS

## 2024-04-22 MED ORDER — SODIUM CHLORIDE 0.9 % IV SOLN
0.9 | INTRAVENOUS | Status: DC | PRN
Start: 2024-04-22 — End: 2024-04-25

## 2024-04-22 MED ORDER — MEMANTINE HCL 5 MG PO TABS
5 | Freq: Two times a day (BID) | ORAL | Status: DC
Start: 2024-04-22 — End: 2024-04-25
  Administered 2024-04-22 – 2024-04-25 (×7): 10 mg via ORAL

## 2024-04-22 MED ORDER — MAGNESIUM SULFATE 2000 MG/50 ML IVPB PREMIX
2 | INTRAVENOUS | Status: DC | PRN
Start: 2024-04-22 — End: 2024-04-25

## 2024-04-22 MED ORDER — ONDANSETRON HCL 4 MG/2ML IJ SOLN
4 | Freq: Four times a day (QID) | INTRAMUSCULAR | Status: DC | PRN
Start: 2024-04-22 — End: 2024-04-25

## 2024-04-22 MED ORDER — NORMAL SALINE FLUSH 0.9 % IV SOLN
0.9 | Freq: Two times a day (BID) | INTRAVENOUS | Status: DC
Start: 2024-04-22 — End: 2024-04-25
  Administered 2024-04-23 – 2024-04-25 (×6): 10 mL via INTRAVENOUS

## 2024-04-22 MED ORDER — ASPIRIN 325 MG PO TABS
325 | Freq: Every day | ORAL | Status: DC
Start: 2024-04-22 — End: 2024-04-25
  Administered 2024-04-22 – 2024-04-25 (×4): 325 mg via ORAL

## 2024-04-22 MED FILL — ASPIRIN 325 MG PO TABS: 325 mg | ORAL | Qty: 1 | Fill #0

## 2024-04-22 MED FILL — ENOXAPARIN SODIUM 40 MG/0.4ML IJ SOSY: 40 MG/0.4ML | INTRAMUSCULAR | Qty: 0.4 | Fill #0

## 2024-04-22 MED FILL — MEMANTINE HCL 5 MG PO TABS: 5 mg | ORAL | Qty: 2 | Fill #0

## 2024-04-22 MED FILL — PROTONIX 40 MG IV SOLR: 40 mg | INTRAVENOUS | Qty: 40 | Fill #0

## 2024-04-22 MED FILL — CEFTRIAXONE SODIUM 1 G IJ SOLR: 1 g | INTRAMUSCULAR | Qty: 1000 | Fill #0

## 2024-04-22 MED FILL — THIAMINE HCL 100 MG/ML IJ SOLN: 100 mg/mL | INTRAMUSCULAR | Qty: 2 | Fill #0

## 2024-04-22 MED FILL — TAMSULOSIN HCL 0.4 MG PO CAPS: 0.4 mg | ORAL | Qty: 2 | Fill #0

## 2024-04-22 NOTE — Progress Notes (Signed)
 "Facility/Department: [facility] ONCOLOGY UNIT  Initial Speech/Language/Cognitive/Swallow Assessment    NAME: Raymond Skinner  DOB: 1956-02-16   MRN: [id number]  ADMISSION DATE: 04/21/2024  ADMITTING DIAGNOSIS: has Recurrent falls; Dementia (HCC); Closed fracture of right femur, initial encounter (HCC); Delirium superimposed on dementia; Closed subcapital fracture of neck of right femur (HCC); Rhabdomyolysis; Alcohol abuse; Cannabis abuse; and Urinary tract infection on their problem list.    Date of Eval: 04/22/2024   Evaluating Therapist: Wandalee Pill, SLP    Pain:  No pain reported during assessment.    Impression:  Completed MINI MENTAL STATE EXAMINATION and patient obtained 20 out of 30 possible points, indicative of mild cognitive-linguistic impairment (patient did not verbalize date, DOW, month, county, or floor of hospital; patient was 0/3 recall, 2/3 multi-step commands, and 0/1 copy design). Subsequently transitioned to full assessment and patient exhibited slow processing, decreased sustained attention, mildly delayed verbalizations with jokes intermingled and mild semantic paraphasis noted, delayed and decreased auditory comprehension of increased complexity, decreased immediate/short-term memory, and decreased higher level reasoning/problem solving.     During evaluation, patient did not verbalize PCP or pharmacy at independent level. Patient denied having conditions in which he takes medications independently. Patient exhibited delays and mild difficulty verbalizing appropriate simple solutions to situations that could occur during activities of daily living at independent level. Patient exhibited delays and mild difficulty calculating time for medication administration independently.    At this time, biggest concerns are decreased attention and decreased memory functioning.    Also assessed patient's swallowing function. Patient exhibited decreased oral prep of more solid consistencies (partials not present).  Patient also exhibited sluggish, inconsistently mildly decreased laryngeal elevation for swallow airway protection. No outward S/S penetration/aspiration was observed with any regular solid consistency trial presented during evaluation this date. Mild delayed throat clears were noted with thin H2O trials.    At this time, do suspect delayed epiglottic inversion. Would however trial easy to chew consistency with least restrictive thin liquids. Recommend meds whole in pudding/applesauce. Will continue to follow. Thank you for this consult.     Goals:  Short-term Goals  Timeframe for Short-term Goals: 1-2x/day for 3 days  Goal 1: Patient will tolerate easy to chew consistency and thin liquids with min S/S penetration/aspiration during PO intake.  Goal 2: Patient staff will follow swallow safety recommendations to decrease risk of penetration/aspiration during PO intake.  Goal 3: Patient will receive daily oral care to decrease bacteria from the oral cavity.  Goal 4: Re-assessment of swallow function for potential diet upgrade.  Goal 5: Patient will complete speech production, oral motor, lingual, and pharyngeal exercises with 80% of opportunities and with provision of min cues/prompts.  Goal 3: Patient will utilize tools/strategies to promote increased clarity of speech at word, phrase, and sentence level with 80% of opportunities and with provision of min cues/prompts.   Goal 4: Re-assessment of speech/language/cognitive functioning.  Goal 5: Patient will complete attention and processing tasks with 80% accuracy and with provision of min cues/prompts.  Goal 6: Patient will complete comprehension and verbalization tasks with 80% accuracy and with provision of min cues/prompts.  Goal 7: Patient will complete memory functioning tasks with 80% accuracy and with provision of min cues/prompts.  Goal 8: Patient will complete reasoning/problem solving tasks/higher cognitive tasks with 80% accuracy and with provision of min  cues/prompts.      Subjective:  Chart Reviewed: Yes  Patient assessed for rehabilitation services?: Yes  Family / Caregiver Present: No  Objective   Auditory Comprehension  Comprehension:  (While delays were noted, patient demonstrated ability to answer simple yes/no questions regarding immediate environment and current state of being at independent level. While delays were observed, patient demonstrated ability to follow simple 1 step commands independently. While delays were noted, patient demonstrated ability to answer yes/no questions of increased complexity at independent level. Patient was 2/3 multi-step command on MINI independently.)     Expression  Primary Mode of Expression:  (Patient was 2/2 confrontation naming on MINI at independent level. Structured responsive speech was considered to be mildly delayed with jokes intermingled and mild semantic paraphasis noted.)     Motor Speech:  (Patient exhibited mildly slowed lingual movements during verbalizations. SLP still ranked functional intelligibility of speech for unfamiliar listeners at 100% in utterances with background noise present.)     Overall Orientation Status:  (Patient demonstrated ability to verbalize name, birthday, address, year, season, state, city, and hospital independently. Patient did not verbalize age, phone number, date, DOW, month, county, or floor of hospital.)    Memory:  (Patient was 3/3 registration, 0/3 recall, and 1/1 repeat phrase on MINI at independent level. Patient demonstrated decreased immediate memory with sequences of unrelated numbers/words set up to 5 items with repetitions provided independently.)     Problem Solving:  (During evaluation, patient did not verbalize PCP or pharmacy at independent level. Patient denied having conditions in which he takes medications independently. Patient exhibited delays and mild difficulty verbalizing appropriate simple solutions to situations that could occur during activities of  daily living at independent level. Patient exhibited delays and mild difficulty calculating time for medication administration independently.)    At this time, biggest concerns are decreased attention and decreased memory functioning.    Additional Assessment:   Assessed patient's swallowing function. With regular solid consistency trials presented independently, patient exhibited decreased rotary jaw movement during oral prep (partials not present). Oral transit of regular solid consistency trials primarily varied from 1-3 seconds in length and min oral cavity residue was noted post swallows; residue cleared from the mouth with additional dry swallows. Oral transit of thin H2O trials, presented independently via straw, primarily measured 1 second in length. Laryngeal elevation during swallow initiation was considered to be sluggish and inconsistently mildly decreased for swallow airway  protection. Patient produced inconsistent audible swallows with thin H2O trials. No outward S/S penetration/aspiration was observed with any regular solid consistency trial presented during evaluation this date. Mild delayed throat clears were noted with thin H2O trials.    At this time, do suspect delayed epiglottic inversion. Would however trial easy to chew consistency with least restrictive thin liquids. Recommend meds whole in pudding/applesauce. Will continue to follow.     Electronically signed by Wandalee Pill, SLP on 04/22/2024 at 3:01 PM     "

## 2024-04-22 NOTE — Progress Notes (Signed)
 "Progress Note    Date:04/22/2024       Room:0428/428-02  Patient Name:Raymond Skinner     Date of Birth:03-Jun-1955     Age:68 y.o.      Subjective    Subjective: 68 year old male with history of ETOH use, Cannabis use and Dementia presenting to the hospital with concerns of AMS and ETOH intoxication. On admission, ETOH levels of 266, CK elevated at 6938, UDS positive for marijuana. CT head with no acute abnormality. Was given IV fluids and admitted in house for further management.     This AM, was seen with no family present. Sleeping soundly but arousable. Answered some simple questions and drifted back to sleep. No acute events noted in chart from overnight.        Review of Systems: unable to fully obtain from patient    Objective         Vitals Last 24 Hours:  TEMPERATURE:  Temp  Avg: 98.7 F (37.1 C)  Min: 98.2 F (36.8 C)  Max: 99.2 F (37.3 C)  RESPIRATIONS RANGE: Resp  Avg: 15  Min: 10  Max: 24  PULSE OXIMETRY RANGE: SpO2  Avg: 97.7 %  Min: 96 %  Max: 100 %  PULSE RANGE: Pulse  Avg: 89.4  Min: 73  Max: 104  BLOOD PRESSURE RANGE: Systolic (24hrs), Avg:141 , Min:121 , Max:160   ; Diastolic (24hrs), Avg:81, Min:65, Max:94    I/O (24Hr):  No intake or output data in the 24 hours ending 04/22/24 1236      Physical Examination:  General: Well-developed, no acute distress lying comfortably in bed.  HEENT: Atraumatic normocephalic, range of motion normal, no JVD, no tracheal deviation noted.  Cardiac: Normal S1-S2 no murmurs  Respiratory: clear To auscultation bilaterally, no rhonchi or rales, no wheezing  Abdomen: Soft, positive bowel sounds, no distention, nontender to palpation, no organomegaly noted, no rebound noted.    Extremities: no tenderness, no edema, moves all extremities  Psych: Affect normal and good eye contact, behavioral normal.        Labs/Imaging/Diagnostics    Labs:  CBC:  Recent Labs     04/21/24  1211 04/22/24  0403   WBC 8.3 9.7   RBC 5.57 5.90   HGB 15.9 17.1   HCT 49.6 50.5   MCV 89.0 85.6    RDW 13.5 13.5   PLT 244 202     CHEMISTRIES:  Recent Labs     04/21/24  1211 04/22/24  0403   NA 145 143   K 4.7 4.6   CL 101 106   CO2 23 22   BUN 17 16   CREATININE 1.1 0.9   GLUCOSE 164* 107*     PT/INR:No results for input(s): PROTIME, INR in the last 72 hours.  APTT:No results for input(s): APTT in the last 72 hours.  LIVER PROFILE:  Recent Labs     04/21/24  1211 04/22/24  0403   AST 125* 342*   ALT 44 90*   BILITOT <0.2 0.3   ALKPHOS 84 83       Imaging Last 24 Hours:  CT HEAD WO CONTRAST  Result Date: 04/21/2024  EXAM: CT OF THE BRAIN WITHOUT CONTRAST  CLINICAL INDICATION: AMS  COMPARISON: 01/14/2024  PROCEDURE: Contiguous axial tomographic sections were obtained from the skull base to the vertex.  FINDINGS: There is moderate generalized cerebral involutional change. No acute intracranial hemorrhage, extra-axial fluid collection, hydrocephalus, mass effect, or midline shift. The  ventricles, cisterns and sulci are normal. Gray-white differentiation is maintained.  There are moderate to severe patchy areas of hypodensity in the periventricular white matter, nonspecific but most commonly encountered in the setting of chronic microvascular disease.  The visualized portions of the globes and orbits appear normal.  The visualized paranasal sinuses and mastoid air cells are clear.  No acute calvarial abnormalities are seen.  _________________________________________       1. No acute intracranial abnormality. 2. 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   _________________________________________  All CT scans are performed using dose optimization techniques as appropriate to the performed exam and include at least one of the following: Automated exposure control, adjustment of the mA and/or kV according to size, and the use of iterative reconstruction technique.  ______________________________________ Electronically signed by: OZELL RICKER M.D. Date:      04/21/2024 Time:    13:30     Assessment//Plan           Hospital Problems           Last Modified POA    * (Principal) Rhabdomyolysis 04/21/2024 Yes    Dementia (HCC) 04/21/2024 Yes    Alcohol abuse 04/21/2024 Yes    Cannabis abuse 04/21/2024 Yes    Urinary tract infection 04/21/2024 Yes     Assessment & Plan      Non traumatic Rhabdomyolysis  Normal saline at 200cc/hr  CK repeat in a.m.  Monitor renal function      Alcohol abuse  CIWA protocol   Thiamine  daily  Seizure precaution     SW eval    Dementia  Cog eval  Social worker for placement       Cannabis abuse  Noted      Urinary tract infection  Culture pending  Rocephin  1 g every 24 x 3            Dispo:TBD      Electronically signed by   Inocente Holmes, MD   Internal Medicine Hospitalist  On 04/22/2024  At 1:34 PM    EMR Dragon/Transcription disclaimer:   Much of this encounter note is an electronic transcription/translation of spoken language to printed text. The electronic translation of spoken language may permit erroneous, or at times, nonsensical words or phrases to be inadvertently transcribed; although attempts have made to review the note for such errors, some may still exist.      "

## 2024-04-22 NOTE — Care Coordination (Signed)
"  Pt will need PT/OT evals for SNF.  Requires precert. Electronically signed by Recardo Sorrel, RN on 04/22/2024 at 11:39 AM    "

## 2024-04-22 NOTE — Progress Notes (Signed)
"  4 Eyes Skin Assessment     NAME:  Raymond Skinner  DATE OF BIRTH:  [birth year]  MEDICAL RECORD NUMBER:  [id number]    The patient is being assessed for  Admission    I agree that at least one RN has performed a thorough Head to Toe Skin Assessment on the patient. ALL assessment sites listed below have been assessed.      Areas assessed by both nurses:    Shoulders, Back, Chest, Arms, Elbows, Hands, Sacrum. Buttock, Coccyx, Ischium, and Legs. Feet and Heels        Does the Patient have a Wound? No noted wound(s)       Braden Prevention initiated by RN: Yes  Wound Care Orders initiated by RN: No    For hospital-acquired stage 1 & 2 and ALL Stage 3,4, Unstageable, DTI, NWPT, and Complex wounds: place order IP Wound Care/Ostomy Nurse Eval and Treat by RN under ORDER ENTRY: No    New Ostomies, if present place, Ostomy referral order under ORDER ENTRY: No     Nurse 1 eSignature: Electronically signed by Kirsten Chute, RN on 04/22/24 at 1:52 AM CST    **SHARE this note so that the co-signing nurse can place an eSignature**    Nurse 2 eSignature: Electronically signed by Lannis Nat Pencil, RN on 04/22/24 at 2:04 AM CST    "

## 2024-04-22 NOTE — Plan of Care (Signed)
"    Problem: Seizure Precautions  Goal: Remains free of injury related to seizures activity  Outcome: Progressing     Problem: Skin/Tissue Integrity  Goal: Skin integrity remains intact  Description: 1.  Monitor for areas of redness and/or skin breakdown  2.  Assess vascular access sites hourly  3.  Every 4-6 hours minimum:  Change oxygen saturation probe site  4.  Every 4-6 hours:  If on nasal continuous positive airway pressure, respiratory therapy assess nares and determine need for appliance change or resting period  Outcome: Progressing     Problem: ABCDS Injury Assessment  Goal: Absence of physical injury  Outcome: Progressing     Problem: Safety - Adult  Goal: Free from fall injury  Outcome: Progressing     "

## 2024-04-22 NOTE — Plan of Care (Signed)
"    Problem: Seizure Precautions  Goal: Remains free of injury related to seizures activity  04/22/2024 1323 by Gideon, Kaitlin  Outcome: Progressing  04/22/2024 0138 by Jaycee Ford, RN  Outcome: Progressing     Problem: Skin/Tissue Integrity  Goal: Skin integrity remains intact  Description: 1.  Monitor for areas of redness and/or skin breakdown  2.  Assess vascular access sites hourly  3.  Every 4-6 hours minimum:  Change oxygen saturation probe site  4.  Every 4-6 hours:  If on nasal continuous positive airway pressure, respiratory therapy assess nares and determine need for appliance change or resting period  04/22/2024 1323 by Gideon, Kaitlin  Outcome: Progressing  04/22/2024 0138 by Jaycee Ford, RN  Outcome: Progressing     Problem: ABCDS Injury Assessment  Goal: Absence of physical injury  04/22/2024 1323 by Gideon, Kaitlin  Outcome: Progressing  04/22/2024 0138 by Jaycee Ford, RN  Outcome: Progressing     Problem: Safety - Adult  Goal: Free from fall injury  04/22/2024 1323 by Gideon, Kaitlin  Outcome: Progressing  04/22/2024 0138 by Jaycee Ford, RN  Outcome: Progressing     "

## 2024-04-23 LAB — COMPREHENSIVE METABOLIC PANEL W/ REFLEX TO MG FOR LOW K
ALT: 69 U/L — ABNORMAL HIGH (ref 10–50)
AST: 199 U/L — ABNORMAL HIGH (ref 10–50)
Albumin: 3.1 g/dL — ABNORMAL LOW (ref 3.5–5.2)
Alkaline Phosphatase: 58 U/L (ref 40–129)
Anion Gap: 7 mmol/L — ABNORMAL LOW (ref 8–16)
BUN: 16 mg/dL (ref 8–23)
CO2: 24 mmol/L (ref 22–29)
Calcium: 8.2 mg/dL — ABNORMAL LOW (ref 8.8–10.2)
Chloride: 109 mmol/L — ABNORMAL HIGH (ref 98–107)
Creatinine: 0.8 mg/dL (ref 0.7–1.2)
Est, Glom Filt Rate: >90 (ref >60)
Glucose: 131 mg/dL — ABNORMAL HIGH (ref 70–99)
Potassium reflex Magnesium: 4.4 mmol/L (ref 3.5–5.0)
Sodium: 140 mmol/L (ref 136–145)
Total Bilirubin: 0.3 mg/dL (ref 0.2–1.2)
Total Protein: 5.2 g/dL — ABNORMAL LOW (ref 6.4–8.3)

## 2024-04-23 LAB — CBC WITH AUTO DIFFERENTIAL
Basophils %: 0.6 % (ref 0.0–1.0)
Basophils Absolute: 0 K/uL (ref 0.00–0.20)
Eosinophils %: 0.9 % (ref 0.0–5.0)
Eosinophils Absolute: 0.1 K/uL (ref 0.00–0.60)
Hematocrit: 38.9 % — ABNORMAL LOW (ref 42.0–52.0)
Hemoglobin: 12.1 g/dL — ABNORMAL LOW (ref 14.0–18.0)
Immature Granulocytes #: 0 K/uL
Lymphocytes %: 29.2 % (ref 20.0–40.0)
Lymphocytes Absolute: 1.9 K/uL (ref 1.1–4.5)
MCH: 27.8 pg (ref 27.0–31.0)
MCHC: 31.1 g/dL — ABNORMAL LOW (ref 33.0–37.0)
MCV: 89.4 fL (ref 80.0–94.0)
MPV: 10.2 fL (ref 9.4–12.4)
Monocytes %: 7.3 % (ref 0.0–10.0)
Monocytes Absolute: 0.5 K/uL (ref 0.00–0.90)
Neutrophils %: 61.8 % (ref 50.0–65.0)
Neutrophils Absolute: 4 K/uL (ref 1.5–7.5)
Platelets: 143 K/uL (ref 130–400)
RBC: 4.35 M/uL — ABNORMAL LOW (ref 4.70–6.10)
RDW: 13.5 % (ref 11.5–14.5)
WBC: 6.5 K/uL (ref 4.8–10.8)

## 2024-04-23 MED ORDER — MELATONIN 5 MG PO TBDP
5 | Freq: Every evening | ORAL | Status: DC
Start: 2024-04-23 — End: 2024-04-25
  Administered 2024-04-23 – 2024-04-25 (×3): 10 mg via ORAL

## 2024-04-23 MED FILL — ASPIRIN 325 MG PO TABS: 325 mg | ORAL | Qty: 1 | Fill #0

## 2024-04-23 MED FILL — ENOXAPARIN SODIUM 40 MG/0.4ML IJ SOSY: 40 MG/0.4ML | INTRAMUSCULAR | Qty: 0.4 | Fill #0

## 2024-04-23 MED FILL — MEMANTINE HCL 5 MG PO TABS: 5 mg | ORAL | Qty: 2 | Fill #0

## 2024-04-23 MED FILL — DONEPEZIL HCL 10 MG PO TABS: 10 mg | ORAL | Qty: 1 | Fill #0

## 2024-04-23 MED FILL — CEFTRIAXONE SODIUM 1 G IJ SOLR: 1 g | INTRAMUSCULAR | Qty: 1000 | Fill #0

## 2024-04-23 MED FILL — PROTONIX 40 MG IV SOLR: 40 mg | INTRAVENOUS | Qty: 40 | Fill #0

## 2024-04-23 MED FILL — MELATONIN 5 MG PO TBDP: 5 mg | ORAL | Qty: 2 | Fill #0

## 2024-04-23 MED FILL — TAMSULOSIN HCL 0.4 MG PO CAPS: 0.4 mg | ORAL | Qty: 2 | Fill #0

## 2024-04-23 MED FILL — THIAMINE HCL 100 MG/ML IJ SOLN: 100 mg/mL | INTRAMUSCULAR | Qty: 2 | Fill #0

## 2024-04-23 NOTE — Progress Notes (Signed)
 "Progress Note    Date:04/23/2024       Room:0428/428-02  Patient Name:Raymond Skinner     Date of Birth:1956/01/02     Age:68 y.o.      Subjective    Subjective: 68 year old male with history of ETOH use, Cannabis use and Dementia presenting to the hospital with concerns of AMS and ETOH intoxication. On admission, ETOH levels of 266, CK elevated at 6938, UDS positive for marijuana. CT head with no acute abnormality. Was given IV fluids and admitted in house for further management.     This AM, was seen with no family present. No acute overnight events, so issues with urinary retention yesterday requiring ISC x3. Discussed with nurse for foley placement today.       Review of Systems: 10 pont system reviewed and negative except as stated above.      Objective         Vitals Last 24 Hours:  TEMPERATURE:  Temp  Avg: 97.3 F (36.3 C)  Min: 97 F (36.1 C)  Max: 97.7 F (36.5 C)  RESPIRATIONS RANGE: Resp  Avg: 16.6  Min: 16  Max: 18  PULSE OXIMETRY RANGE: SpO2  Avg: 98.6 %  Min: 96 %  Max: 100 %  PULSE RANGE: Pulse  Avg: 67.8  Min: 58  Max: 76  BLOOD PRESSURE RANGE: Systolic (24hrs), Avg:136 , Min:127 , Max:144   ; Diastolic (24hrs), Avg:79, Min:74, Max:89    I/O (24Hr):    Intake/Output Summary (Last 24 hours) at 04/23/2024 1133  Last data filed at 04/23/2024 0457  Gross per 24 hour   Intake 5036.59 ml   Output 1624 ml   Net 3412.59 ml         Physical Examination:  General: Well-developed, no acute distress lying comfortably in bed.  HEENT: Atraumatic normocephalic, range of motion normal, no JVD, no tracheal deviation noted.  Cardiac: Normal S1-S2 no murmurs  Respiratory: clear To auscultation bilaterally, no rhonchi or rales, no wheezing  Abdomen: Soft, positive bowel sounds, no distention, nontender to palpation, no organomegaly noted, no rebound noted.    Extremities: no tenderness, no edema, moves all extremities  Psych: Affect normal and good eye contact, behavioral normal.        Labs/Imaging/Diagnostics     Labs:  CBC:  Recent Labs     04/21/24  1211 04/22/24  0403 04/23/24  0319   WBC 8.3 9.7 6.5   RBC 5.57 5.90 4.35*   HGB 15.9 17.1 12.1*   HCT 49.6 50.5 38.9*   MCV 89.0 85.6 89.4   RDW 13.5 13.5 13.5   PLT 244 202 143     CHEMISTRIES:  Recent Labs     04/21/24  1211 04/22/24  0403 04/23/24  0319   NA 145 143 140   K 4.7 4.6 4.4   CL 101 106 109*   CO2 23 22 24    BUN 17 16 16    CREATININE 1.1 0.9 0.8   GLUCOSE 164* 107* 131*     PT/INR:No results for input(s): PROTIME, INR in the last 72 hours.  APTT:No results for input(s): APTT in the last 72 hours.  LIVER PROFILE:  Recent Labs     04/21/24  1211 04/22/24  0403 04/23/24  0319   AST 125* 342* 199*   ALT 44 90* 69*   BILITOT <0.2 0.3 0.3   ALKPHOS 84 83 58       Imaging Last 24 Hours:  CT HEAD  WO CONTRAST  Result Date: 04/21/2024  EXAM: CT OF THE BRAIN WITHOUT CONTRAST  CLINICAL INDICATION: AMS  COMPARISON: 01/14/2024  PROCEDURE: Contiguous axial tomographic sections were obtained from the skull base to the vertex.  FINDINGS: There is moderate generalized cerebral involutional change. No acute intracranial hemorrhage, extra-axial fluid collection, hydrocephalus, mass effect, or midline shift. The ventricles, cisterns and sulci are normal. Gray-white differentiation is maintained.  There are moderate to severe patchy areas of hypodensity in the periventricular white matter, nonspecific but most commonly encountered in the setting of chronic microvascular disease.  The visualized portions of the globes and orbits appear normal.  The visualized paranasal sinuses and mastoid air cells are clear.  No acute calvarial abnormalities are seen.  _________________________________________       1. No acute intracranial abnormality. 2. 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   _________________________________________  All CT scans are performed using dose optimization techniques as appropriate to the performed exam and include at  least one of the following: Automated exposure control, adjustment of the mA and/or kV according to size, and the use of iterative reconstruction technique.  ______________________________________ Electronically signed by: OZELL RICKER M.D. Date:     04/21/2024 Time:    13:30     Assessment//Plan           Hospital Problems           Last Modified POA    * (Principal) Rhabdomyolysis 04/21/2024 Yes    Dementia (HCC) 04/21/2024 Yes    Alcohol abuse 04/21/2024 Yes    Cannabis abuse 04/21/2024 Yes    Urinary tract infection 04/21/2024 Yes     Assessment & Plan      Non traumatic Rhabdomyolysis  Normal saline at 200cc/hr  CK repeat in a.m.  Monitor renal function      Alcohol abuse  CIWA protocol   Thiamine  daily  Seizure precaution     SW eval    Dementia  Cog eval  Social worker for placement       Cannabis abuse  Noted      Urinary tract infection  Culture pending  Rocephin  1 g every 24 x 3        Urinary Retention  Foley placement  Flomax         Dispo:TBD      Electronically signed by   Inocente Holmes, MD   Internal Medicine Hospitalist  On 04/23/2024  At 11:33 AM    EMR Dragon/Transcription disclaimer:   Much of this encounter note is an electronic transcription/translation of spoken language to printed text. The electronic translation of spoken language may permit erroneous, or at times, nonsensical words or phrases to be inadvertently transcribed; although attempts have made to review the note for such errors, some may still exist.      "

## 2024-04-23 NOTE — Plan of Care (Signed)
"    Problem: Seizure Precautions  Goal: Remains free of injury related to seizures activity  04/23/2024 0308 by Vernel Avelina LABOR, LPN  Outcome: Progressing  Flowsheets (Taken 04/23/2024 0308)  Remains free of injury related to seizure activity:   Maintain airway, patient safety  and administer oxygen as ordered   If seizure occurs, turn patient to side and suction secretions as needed   Seizure pads on all 4 side rails   Instruct patient/family to call for assistance with activity based on assessment   Monitor patient for seizure activity, document and report duration and description of seizure to Licensed Independent Practitioner   Reorient patient post seizure   Instruct patient/family to notify RN of any seizure activity  04/22/2024 1323 by Gideon, Kaitlin  Outcome: Progressing     Problem: Skin/Tissue Integrity  Goal: Skin integrity remains intact  Description: 1.  Monitor for areas of redness and/or skin breakdown  04/23/2024 0308 by Vernel Avelina LABOR, LPN  Outcome: Progressing  Flowsheets (Taken 04/22/2024 2350)  Skin Integrity Remains Intact: Monitor for areas of redness and/or skin breakdown  04/22/2024 1323 by Gideon, Kaitlin  Outcome: Progressing     Problem: ABCDS Injury Assessment  Goal: Absence of physical injury  04/23/2024 0308 by Vernel Avelina LABOR, LPN  Outcome: Progressing  Flowsheets (Taken 04/22/2024 2350)  Absence of Physical Injury: Implement safety measures based on patient assessment  04/22/2024 1323 by Gideon, Kaitlin  Outcome: Progressing     Problem: Safety - Adult  Goal: Free from fall injury  04/23/2024 0308 by Vernel Avelina LABOR, LPN  Outcome: Progressing  Flowsheets (Taken 04/22/2024 2350)  Free From Fall Injury: Instruct family/caregiver on patient safety  04/22/2024 1323 by Gideon, Kaitlin  Outcome: Progressing     "

## 2024-04-23 NOTE — Progress Notes (Signed)
"  Comprehensive Nutrition Assessment    Type and Reason for Visit:  Initial, Positive nutrition screen, Wound    Nutrition Recommendations/Plan:   Add Ensure High Protein once daily.      Malnutrition Assessment:  Malnutrition Status:  No malnutrition (04/23/24 0914)    Context:  Acute Illness       Nutrition Assessment:    +NS for pressure injuries to bilateral ankles. Pt presents at risk for nutritional compromise r/t increased nutrient needs associated with wounds. Per wt history, pt has not had any recent significant wt loss. PO intake is >75% so far this admission on an Easy to Limited brands. Will add ONS once daily to increase protein provision to support wound healing. No other needs identified at this time. Will continue to follow.    Nutrition Related Findings:      Wound Type: Pressure Injury (bilateral ankles)       Current Nutrition Intake & Therapies:    Average Meal Intake: 76-100%  Average Supplements Intake: None Ordered  ADULT DIET; Easy to Chew  ADULT ORAL NUTRITION SUPPLEMENT; Lunch; Low Calorie/High Protein Oral Supplement    Anthropometric Measures:  Height: 182.9 cm (6')  Ideal Body Weight (IBW): 178 lbs (81 kg)       Current Body Weight: 85.7 kg (188 lb 15 oz), 106.1 % IBW.    Current BMI (kg/m2): 25.6  Usual Body Weight: 85.3 kg (188 lb) (12/08/23)     % Weight Change (Calculated): 0.5  Weight Adjustment For: No Adjustment   BMI Categories: Overweight (BMI 25.0-29.9)    Estimated Daily Nutrient Needs:  Energy Requirements Based On: Kcal/kg  Weight Used for Energy Requirements: Current  Energy (kcal/day): 8285-7856 (20-25kcal/kg)  Weight Used for Protein Requirements: Ideal  Protein (g/day): 97-121 (1.2-1.5g/kg)  Method Used for Fluid Requirements: 1 ml/kcal  Fluid (ml/day): 8285-7856    Nutrition Diagnosis:   Increased nutrient needs related to increase demand for energy/nutrients as evidenced by wounds    Nutrition Interventions:   Food and/or Nutrient Delivery: Continue Current Diet, Start Oral  Nutrition Supplement  Nutrition Education/Counseling: No recommendation at this time  Coordination of Nutrition Care: Continue to monitor while inpatient       Goals:  Goals: Meet at least 75% of estimated needs  Type of Goal: New goal  Previous Goal Met: New Goal    Nutrition Monitoring and Evaluation:   Behavioral-Environmental Outcomes: None Identified  Food/Nutrient Intake Outcomes: Food and Nutrient Intake, Supplement Intake  Physical Signs/Symptoms Outcomes: Biochemical Data, Chewing or Swallowing, Nutrition Focused Physical Findings, Weight, Skin    Discharge Planning:    Continue Oral Nutrition Supplement     Clement Piety, MS, RD, LD, CDCES  Contact: [phone]    "

## 2024-04-23 NOTE — Plan of Care (Signed)
"    Problem: Seizure Precautions  Goal: Remains free of injury related to seizures activity  04/23/2024 0949 by Christena Dominique Jenkins Earnie, LPN  Outcome: Progressing  04/23/2024 0308 by Vernel Avelina LABOR, LPN  Outcome: Progressing  Flowsheets (Taken 04/23/2024 0308)  Remains free of injury related to seizure activity:   Maintain airway, patient safety  and administer oxygen as ordered   If seizure occurs, turn patient to side and suction secretions as needed   Seizure pads on all 4 side rails   Instruct patient/family to call for assistance with activity based on assessment   Monitor patient for seizure activity, document and report duration and description of seizure to Licensed Independent Practitioner   Reorient patient post seizure   Instruct patient/family to notify RN of any seizure activity     Problem: Skin/Tissue Integrity  Goal: Skin integrity remains intact  Description: 1.  Monitor for areas of redness and/or skin breakdown  04/23/2024 0949 by Christena Dominique Jenkins Earnie, LPN  Outcome: Progressing  04/23/2024 0308 by Vernel Avelina LABOR, LPN  Outcome: Progressing  Flowsheets (Taken 04/22/2024 2350)  Skin Integrity Remains Intact: Monitor for areas of redness and/or skin breakdown     Problem: ABCDS Injury Assessment  Goal: Absence of physical injury  04/23/2024 0949 by Christena Dominique Jenkins Earnie, LPN  Outcome: Progressing  04/23/2024 0308 by Vernel Avelina LABOR, LPN  Outcome: Progressing  Flowsheets (Taken 04/22/2024 2350)  Absence of Physical Injury: Implement safety measures based on patient assessment     Problem: Safety - Adult  Goal: Free from fall injury  04/23/2024 0949 by Christena Dominique Jenkins Earnie, LPN  Outcome: Progressing  04/23/2024 0308 by Vernel Avelina LABOR, LPN  Outcome: Progressing  Flowsheets (Taken 04/22/2024 2350)  Free From Fall Injury: Instruct family/caregiver on patient safety     Problem: Nutrition Deficit:  Goal: Optimize nutritional status  Outcome: Progressing     "

## 2024-04-24 LAB — CBC WITH AUTO DIFFERENTIAL
Basophils %: 0.6 % (ref 0.0–1.0)
Basophils Absolute: 0 K/uL (ref 0.00–0.20)
Eosinophils %: 2.5 % (ref 0.0–5.0)
Eosinophils Absolute: 0.2 K/uL (ref 0.00–0.60)
Hematocrit: 39.3 % — ABNORMAL LOW (ref 42.0–52.0)
Hemoglobin: 13.1 g/dL — ABNORMAL LOW (ref 14.0–18.0)
Immature Granulocytes #: 0 K/uL
Lymphocytes %: 28 % (ref 20.0–40.0)
Lymphocytes Absolute: 1.8 K/uL (ref 1.1–4.5)
MCH: 29 pg (ref 27.0–31.0)
MCHC: 33.3 g/dL (ref 33.0–37.0)
MCV: 87.1 fL (ref 80.0–94.0)
MPV: 10.9 fL (ref 9.4–12.4)
Monocytes %: 6.4 % (ref 0.0–10.0)
Monocytes Absolute: 0.4 K/uL (ref 0.00–0.90)
Neutrophils %: 62.3 % (ref 50.0–65.0)
Neutrophils Absolute: 4 K/uL (ref 1.5–7.5)
Platelets: 161 K/uL (ref 130–400)
RBC: 4.51 M/uL — ABNORMAL LOW (ref 4.70–6.10)
RDW: 13.2 % (ref 11.5–14.5)
WBC: 6.4 K/uL (ref 4.8–10.8)

## 2024-04-24 LAB — COMPREHENSIVE METABOLIC PANEL W/ REFLEX TO MG FOR LOW K
ALT: 85 U/L — ABNORMAL HIGH (ref 10–50)
AST: 200 U/L — ABNORMAL HIGH (ref 10–50)
Albumin: 3.5 g/dL (ref 3.5–5.2)
Alkaline Phosphatase: 63 U/L (ref 40–129)
Anion Gap: 10 mmol/L (ref 8–16)
BUN: 12 mg/dL (ref 8–23)
CO2: 24 mmol/L (ref 22–29)
Calcium: 8.5 mg/dL — ABNORMAL LOW (ref 8.8–10.2)
Chloride: 107 mmol/L (ref 98–107)
Creatinine: 0.7 mg/dL (ref 0.7–1.2)
Est, Glom Filt Rate: >90 (ref >60)
Glucose: 125 mg/dL — ABNORMAL HIGH (ref 70–99)
Potassium reflex Magnesium: 4.1 mmol/L (ref 3.5–5.0)
Sodium: 141 mmol/L (ref 136–145)
Total Bilirubin: 0.3 mg/dL (ref 0.2–1.2)
Total Protein: 5.9 g/dL — ABNORMAL LOW (ref 6.4–8.3)

## 2024-04-24 LAB — CK: Total CK: 5090 U/L — ABNORMAL HIGH (ref 39–308)

## 2024-04-24 MED ORDER — QUETIAPINE FUMARATE 50 MG PO TABS
50 | Freq: Every evening | ORAL | Status: DC
Start: 2024-04-24 — End: 2024-04-25
  Administered 2024-04-25: 04:00:00 25 mg via ORAL

## 2024-04-24 MED FILL — MEMANTINE HCL 5 MG PO TABS: 5 mg | ORAL | Qty: 2 | Fill #0

## 2024-04-24 MED FILL — TAMSULOSIN HCL 0.4 MG PO CAPS: 0.4 mg | ORAL | Qty: 2 | Fill #0

## 2024-04-24 MED FILL — THIAMINE HCL 100 MG/ML IJ SOLN: 100 mg/mL | INTRAMUSCULAR | Qty: 2 | Fill #0

## 2024-04-24 MED FILL — DONEPEZIL HCL 10 MG PO TABS: 10 mg | ORAL | Qty: 1 | Fill #0

## 2024-04-24 MED FILL — PROTONIX 40 MG IV SOLR: 40 mg | INTRAVENOUS | Qty: 40 | Fill #0

## 2024-04-24 MED FILL — ASPIRIN 325 MG PO TABS: 325 mg | ORAL | Qty: 1 | Fill #0

## 2024-04-24 MED FILL — ENOXAPARIN SODIUM 40 MG/0.4ML IJ SOSY: 40 MG/0.4ML | INTRAMUSCULAR | Qty: 0.4 | Fill #0

## 2024-04-24 MED FILL — MELATONIN 5 MG PO TBDP: 5 mg | ORAL | Qty: 2 | Fill #0

## 2024-04-24 NOTE — Progress Notes (Signed)
 " Occupational Therapy Initial Assessment  Date: 04/24/2024   Patient Name: Raymond Skinner  MRN: [id number]     DOB: [birth year]    Date of Service: 04/24/2024    Discharge Recommendations:  Patient would benefit from continued therapy after discharge       Assessment   Assessment: Evaluation completed and tx initiated.  The patient would benefit from further skilled therapy to upgrade safety and functional independence.  Treatment Diagnosis: Rhabdomyolysis, found lying on the floor  History: ED with ETOH intoxiction, AMS; hx of dementia    REQUIRES OT FOLLOW-UP: Yes  Activity Tolerance  Activity Tolerance: Patient Tolerated treatment well           Patient Diagnosis(es): The primary encounter diagnosis was Altered mental status, unspecified altered mental status type. Diagnoses of Acute alcoholic intoxication with delirium, History of dementia, and Unable to care for self were also pertinent to this visit.   has a past medical history of Dementia (HCC) and Epstein Barr infection.   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    Treatment Diagnosis: Rhabdomyolysis, found lying on the floor      Restrictions  Restrictions/Precautions  Restrictions/Precautions: Fall Risk, Bed Alarm  Activity Level: Up with Assist    Subjective   General  Chart Reviewed: Yes  Patient assessed for rehabilitation services?: Yes  Family / Caregiver Present: No     04/24/24 1115   Pain   Pre-Pain 0   Post-Pain 0       Social/Functional History  Social/Functional History  Lives With: Other (Comment) (He lives with his ex-sister-in-law [name] (who is his DPOA), she is retired and is home during the day and night.)  Type of Home: House  Home Layout: One level  Home Access: Stairs to enter with rails  Entrance Stairs - Number of Steps: He can't remember exactly, he thinks maybe 2 stairs. He thinks the house is split level.  Bathroom Shower/Tub: Psychologist, counselling (he is unsure)  Receives Help From: Other (Comment)  (ex-sister-in-law [name] (who is his DPOA), he is unsure if anyone else assists him)  Prior Level of Assist for ADLs: Independent  Prior Level of Assist for Homemaking: Independent  Prior Level of Assist for Ambulation: Independent household ambulator, with or without device, Independent community ambulator, with or without device  Prior Level of Assist for Transfers: Independent  Active Driver: Yes (He stated, I drive, I love driving. Unsure if this is accurate.)  Mode of Transportation: Car       Objective           Observation/Palpation  Posture: Good  Observation: Patient was lying in bed upon therapist arrivcal.  Copywriter, Advertising - Technique: Stand step  Toilet Transfer: Minimal assistance    ADL  Feeding: Supervision  Grooming: Supervision  UE Bathing: Supervision  LE Bathing: Minimal assistance  UE Dressing: Supervision  LE Dressing: Minimal assistance  Toileting: Minimal assistance          Bed mobility  Supine to Sit: Stand by assistance  Sit to Supine: Stand by assistance  Scooting: Stand by assistance    Transfers  Stand Step Transfers: Minimal assistance         Cognition  Cognition Comment: Impulsive, poor problem solving and memory, had difficulty providing accurate details of his home or stating the date.  Pleasant, verbal, and easily engaged in therapeutic activity.  LUE AROM (degrees)  LUE AROM : WNL  LUE General AROM: 5/5 MMT  RUE AROM (degrees)  RUE AROM : WNL  RUE General AROM: 5/5 MMT                          Plan   Occupational Therapy Plan  Times Per Week: 3-5    Goals  Short Term Goals  Short Term Goal 1: Perform transfers with CGA  Short Term Goal 2: Perform dressing with CGA in a structured environment  Short Term Goal 3: Perform toileting with CGA in a structured environment  Short Term Goal 4: Independent with therapeutic activity recommendations, safety, and positioning recommendations  Long Term Goals  Long Term Goal 1: Upgrade as tolerated       Tx  initiated: Standing balance- loss of balance posteriorly. Noted to have left foot drop. Instructed in safety. (15 mins)             Electronically signed by Ronal JINNY Raw, OT on 04/24/2024 at 4:18 PM  "

## 2024-04-24 NOTE — Plan of Care (Signed)
"    Problem: Seizure Precautions  Goal: Remains free of injury related to seizures activity  04/24/2024 1014 by Christena Dominique Jenkins Earnie, LPN  Outcome: Progressing  04/24/2024 0314 by Vernel Avelina LABOR, LPN  Outcome: Progressing  Flowsheets (Taken 04/23/2024 0308)  Remains free of injury related to seizure activity:   Maintain airway, patient safety  and administer oxygen as ordered   If seizure occurs, turn patient to side and suction secretions as needed   Seizure pads on all 4 side rails   Instruct patient/family to call for assistance with activity based on assessment   Monitor patient for seizure activity, document and report duration and description of seizure to Licensed Independent Practitioner   Reorient patient post seizure   Instruct patient/family to notify RN of any seizure activity     Problem: Skin/Tissue Integrity  Goal: Skin integrity remains intact  Description: 1.  Monitor for areas of redness and/or skin breakdown  04/24/2024 1014 by Christena Dominique Jenkins Earnie, LPN  Outcome: Progressing  04/24/2024 0314 by Vernel Avelina LABOR, LPN  Outcome: Progressing  Flowsheets  Taken 04/24/2024 0019  Skin Integrity Remains Intact: Monitor for areas of redness and/or skin breakdown  Taken 04/23/2024 2109  Skin Integrity Remains Intact: Monitor for areas of redness and/or skin breakdown     Problem: ABCDS Injury Assessment  Goal: Absence of physical injury  04/24/2024 1014 by Christena Dominique Jenkins Earnie, LPN  Outcome: Progressing  04/24/2024 0314 by Vernel Avelina LABOR, LPN  Outcome: Progressing  Flowsheets (Taken 04/24/2024 0019)  Absence of Physical Injury: Implement safety measures based on patient assessment     Problem: Safety - Adult  Goal: Free from fall injury  04/24/2024 1014 by Christena Dominique Jenkins Earnie, LPN  Outcome: Progressing  04/24/2024 0314 by Vernel Avelina LABOR, LPN  Outcome: Progressing  Flowsheets (Taken 04/24/2024 0019)  Free From Fall Injury: Instruct family/caregiver on patient safety     Problem: Nutrition  Deficit:  Goal: Optimize nutritional status  04/24/2024 1014 by Christena Dominique Jenkins Earnie, LPN  Outcome: Progressing  04/24/2024 0314 by Vernel Avelina LABOR, LPN  Outcome: Progressing  Flowsheets (Taken 04/24/2024 0314)  Nutrient intake appropriate for improving, restoring, or maintaining nutritional needs:   Assess nutritional status and recommend course of action   Monitor oral intake, labs, and treatment plans     "

## 2024-04-24 NOTE — Progress Notes (Signed)
 "Physical Therapy  Facility/Department: [facility]  Physical Therapy Initial Assessment    Name: Raymond Skinner  DOB: 1955/11/20  MRN: [id number]  Date of Service: 04/24/2024    Assessment  PT evaluation complete and treatment initiated. Patient was cooperative and eager to participate in PT evaluation. He performed bed mobility with standby assist; sit to/from stand with CGA + verbal cues. He ambulated 125 ft with a rolling walker + gait belt + CGA, compensating very well for his left foot drop and lack of sensation. He will benefit from continued skilled physical therapy to address functional mobility impairments. He is a fall risk and should not attempt mobility without assistance - PT reinforced this several times with patient and ensured he knew which button to use to contact the nursing staff. Recommend use of  rolling walker + gait belt + assistance for all mobility.    Body Structures, Functions, Activity Limitations Requiring Skilled Therapeutic Intervention: Decreased functional mobility ;Decreased endurance;Decreased balance;Increased pain    Therapy Prognosis: Good  Requires PT Follow-Up: Yes  Activity Tolerance  Activity Tolerance: Patient tolerated treatment well    Discharge Recommendations:  Continue to assess pending progress, Patient would benefit from continued therapy after discharge       Patient Diagnosis(es): The primary encounter diagnosis was Altered mental status, unspecified altered mental status type. Diagnoses of Acute alcoholic intoxication with delirium, History of dementia, and Unable to care for self were also pertinent to this visit.  Past Medical History:  has a past medical history of Dementia (HCC) and Epstein Barr infection.  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    Plan  Physical Therapy Plan  General Plan: 5-7 times per week  Therapy Duration: 2 Weeks  Current Treatment Recommendations: Strengthening,  Balance training, Functional mobility training, Transfer training, Neuromuscular re-education, Gait training, Safety education & training, Patient/Caregiver education & training, Therapeutic activities, Endurance training  Safety Devices  Type of Devices: Patient at risk for falls, Call light within reach, Left in chair, Chair alarm in place, Gait belt    Restrictions  Restrictions/Precautions: Fall Risk, Bed Alarm  Activity Level: Up with Assist     Subjective  Chart Reviewed: Yes  Patient assessed for rehabilitation services?: Yes  Additional Pertinent Hx: right hip fracture s/p THA 01/14/24  Family/Caregiver Present: No  Diagnosis: Rhabdomyolysis, dementia, alcohol and cannabis use  Follows Commands: Within Functional Limits  General Comments: okay for PT per RN  Subjective: Patient reports he is feeling good. He was cooperative and eager to particpate with therapy evaluation.    Social/Functional History  Social/Functional History  Lives With: Other (Comment) (He lives with his ex-sister-in-law [name] (who is his DPOA), she is retired and is home during the day and night.)  Type of Home: House  Home Layout: One level  Home Access: Stairs to enter with rails  Entrance Stairs - Number of Steps: He can't remember exactly, he thinks maybe 2 stairs. He thinks the house is split level.  Bathroom Shower/Tub: Psychologist, counselling (he is unsure)  Receives Help From: Other (Comment) (ex-sister-in-law [name] (who is his DPOA), he is unsure if anyone else assists him)  Prior Level of Assist for ADLs: Independent  Prior Level of Assist for Homemaking: Independent  Prior Level of Assist for Ambulation: Independent household ambulator, with or without device, Independent community ambulator, with or without device  Prior Level of Assist for Transfers: Surveyor, Minerals: Yes (  He stated, I drive, I love driving. Unsure if this is accurate.)  Mode of Transportation: Car    Vision/Hearing  Vision: Within Functional  Limits  Hearing: Within Marine Scientist   Orientation  Orientation Level: Oriented to person;Disoriented to time;Disoriented to situation   He knew the month and year, he thought it was the 22nd or some date in the 52s.  Cognition  Overall Cognitive Status: Exceptions  Arousal/Alertness: Appears intact  Following Commands: Follows one step commands with repetition  Attention Span: Attends with cues to redirect  Memory: Impaired  Safety Judgement: Impaired  Problem Solving: Impaired  Insights: Impaired    Objective  Posture: Good  Observation: Patient was lying in bed upon therapist arrivcal.  Gross Assessment  Sensation: Impaired (light touch left foot was absent)    Pain  Pre-Pain: 0  Post-Pain: 0    Strength  Strength RLE: WFL  Strength LLE: WFL - EXCEPTION - L Ankle Dorsiflexion: 0/5  He was unaware and surprised that he had foot drop, stating he had no idea when or why that may have started.    Strength RUE: WFL  Strength LUE: WFL    Bed mobility  Supine to Sit: Stand by assistance  Sit to Supine: Stand by assistance  Scooting: Stand by assistance    Transfers  Sit to Stand: Contact guard assistance  Stand to Sit: Contact guard assistance  Comment: Verbal cues to push off and control descent with UEs; and to use the walker while turning, aligning, and stepping back to the surface prior to sitting.    Ambulation  Surface: Level tile  Device: Rolling Walker  Assistance: Contact guard assistance  Gait Deviations: Slow Cadence;Increased BOS;Decreased step length;Decreased step height  Distance: 125 ft  Comments: He compensated very well for his left foot drop and lack of sensation.    Balance  Posture: Good  Sitting - Static: Good  Sitting - Dynamic: Good  Standing - Static: Poor  Standing - Dynamic: Poor  He was able to stand with a walker with standby assist. Without UE support, he was able to stand with his head forward, eye looking toward the floor. When cued to stand more upright/lift his head  and eyes, he immediately lost his balance backward.    Goals  Time Frame for Short Term Goals: 2 weeks  Short Term Goal 1: Patient will perform independent bed mobility.  Short Term Goal 2: Patient will transfer safely with supervision from a variety of surfaces.  Short Term Goal 3: Patient will ambulate 250 ft with an appropriate assistive device safely with supervision.  Patient Goals : Patient did not state any goals. Per chart review, his sister-in-law would like him to go to SNF.    Patient Education  Education Given To: Patient  Education Provided: Role of Therapy;Mobility Training;Plan of Care;Fall Pensions Consultant Provided Comments: Instructed patient to use the call light to contact the nurse for any needs; to not attempt to get up on his own; and on use of a chair alarm for max safety/fall risk.  Education Method: Demonstration;Verbal  Barriers to Learning: Cognition  Education Outcome: Verbalized understanding;Continued education needed      MARGO PYO, PT  Electronically signed by MARGO PYO, PT on 04/24/2024 at 3:37 PM           "

## 2024-04-24 NOTE — Progress Notes (Signed)
 "Progress Note    Date:04/24/2024       Room:0428/428-02  Patient Name:Raymond Skinner     Date of Birth:[birth year]     Age:68 y.o.      Subjective    Subjective: 68 year old male with history of ETOH use, Cannabis use and Dementia presenting to the hospital with concerns of AMS and ETOH intoxication. On admission, ETOH levels of 266, CK elevated at 6938, UDS positive for marijuana. CT head with no acute abnormality. Was given IV fluids and admitted in house for further management. In house continued on high rate IVF for rhabdomyolysis. No overt ETOH withdrawal symptoms noted. Foley placed for urinary retention, will attempt voiding trial as mobility improves.     This AM, was seen with no family present. No acute overnight events, some hallucinations overnight and sundowning. Initiated on seroquel .        Review of Systems: 10 pont system reviewed and negative except as stated above.      Objective         Vitals Last 24 Hours:  TEMPERATURE:  Temp  Avg: 97.6 F (36.4 C)  Min: 97 F (36.1 C)  Max: 98.4 F (36.9 C)  RESPIRATIONS RANGE: Resp  Avg: 17.5  Min: 16  Max: 18  PULSE OXIMETRY RANGE: SpO2  Avg: 98.1 %  Min: 97 %  Max: 100 %  PULSE RANGE: Pulse  Avg: 64.6  Min: 56  Max: 76  BLOOD PRESSURE RANGE: Systolic (24hrs), Avg:141 , Min:125 , Max:158   ; Diastolic (24hrs), Avg:73, Min:58, Max:86    I/O (24Hr):    Intake/Output Summary (Last 24 hours) at 04/24/2024 1136  Last data filed at 04/24/2024 1121  Gross per 24 hour   Intake 3136.85 ml   Output 3350 ml   Net -213.15 ml         Physical Examination:  General: Well-developed, no acute distress lying comfortably in bed.  HEENT: Atraumatic normocephalic, range of motion normal, no JVD, no tracheal deviation noted.  Cardiac: Normal S1-S2 no murmurs  Respiratory: clear To auscultation bilaterally, no rhonchi or rales, no wheezing  Abdomen: Soft, positive bowel sounds, no distention, nontender to palpation, no organomegaly noted, no rebound noted.    Extremities: no  tenderness, no edema, moves all extremities  Psych: Affect normal and good eye contact, behavioral normal.        Labs/Imaging/Diagnostics    Labs:  CBC:  Recent Labs     04/22/24  0403 04/23/24  0319 04/24/24  0301   WBC 9.7 6.5 6.4   RBC 5.90 4.35* 4.51*   HGB 17.1 12.1* 13.1*   HCT 50.5 38.9* 39.3*   MCV 85.6 89.4 87.1   RDW 13.5 13.5 13.2   PLT 202 143 161     CHEMISTRIES:  Recent Labs     04/22/24  0403 04/23/24  0319 04/24/24  0301   NA 143 140 141   K 4.6 4.4 4.1   CL 106 109* 107   CO2 22 24 24    BUN 16 16 12    CREATININE 0.9 0.8 0.7   GLUCOSE 107* 131* 125*     PT/INR:No results for input(s): PROTIME, INR in the last 72 hours.  APTT:No results for input(s): APTT in the last 72 hours.  LIVER PROFILE:  Recent Labs     04/22/24  0403 04/23/24  0319 04/24/24  0301   AST 342* 199* 200*   ALT 90* 69* 85*   BILITOT 0.3 0.3  0.3   ALKPHOS 83 58 63       Imaging Last 24 Hours:  CT HEAD WO CONTRAST  Result Date: 04/21/2024  EXAM: CT OF THE BRAIN WITHOUT CONTRAST  CLINICAL INDICATION: AMS  COMPARISON: 01/14/2024  PROCEDURE: Contiguous axial tomographic sections were obtained from the skull base to the vertex.  FINDINGS: There is moderate generalized cerebral involutional change. No acute intracranial hemorrhage, extra-axial fluid collection, hydrocephalus, mass effect, or midline shift. The ventricles, cisterns and sulci are normal. Gray-white differentiation is maintained.  There are moderate to severe patchy areas of hypodensity in the periventricular white matter, nonspecific but most commonly encountered in the setting of chronic microvascular disease.  The visualized portions of the globes and orbits appear normal.  The visualized paranasal sinuses and mastoid air cells are clear.  No acute calvarial abnormalities are seen.  _________________________________________       1. No acute intracranial abnormality. 2. 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    _________________________________________  All CT scans are performed using dose optimization techniques as appropriate to the performed exam and include at least one of the following: Automated exposure control, adjustment of the mA and/or kV according to size, and the use of iterative reconstruction technique.  ______________________________________ Electronically signed by: OZELL RICKER M.D. Date:     04/21/2024 Time:    13:30     Assessment//Plan           Hospital Problems           Last Modified POA    * (Principal) Rhabdomyolysis 04/21/2024 Yes    Dementia (HCC) 04/21/2024 Yes    Alcohol abuse 04/21/2024 Yes    Cannabis abuse 04/21/2024 Yes    Urinary tract infection 04/21/2024 Yes     Assessment & Plan      Non traumatic Rhabdomyolysis  Normal saline at 200cc/hr  Monitor renal function      Alcohol abuse  No withdrawal symptoms noted. CIWA discontinued  Thiamine  daily  Seizure precaution     SW eval    Dementia  Cog eval  Social worker for placement       Cannabis abuse  Noted      Urinary tract infection  Culture pending  Rocephin  1 g every 24 x 3        Urinary Retention  Foley placement  Flomax         Dispo:TBD; therapy following      Electronically signed by   Inocente Holmes, MD   Internal Medicine Hospitalist  On 04/24/2024  At 11:36 AM    EMR Dragon/Transcription disclaimer:   Much of this encounter note is an electronic transcription/translation of spoken language to printed text. The electronic translation of spoken language may permit erroneous, or at times, nonsensical words or phrases to be inadvertently transcribed; although attempts have made to review the note for such errors, some may still exist.      "

## 2024-04-24 NOTE — Plan of Care (Signed)
"    Problem: Seizure Precautions  Goal: Remains free of injury related to seizures activity  Outcome: Progressing  Flowsheets (Taken 04/23/2024 0308)  Remains free of injury related to seizure activity:   Maintain airway, patient safety  and administer oxygen as ordered   If seizure occurs, turn patient to side and suction secretions as needed   Seizure pads on all 4 side rails   Instruct patient/family to call for assistance with activity based on assessment   Monitor patient for seizure activity, document and report duration and description of seizure to Licensed Independent Practitioner   Reorient patient post seizure   Instruct patient/family to notify RN of any seizure activity     Problem: Skin/Tissue Integrity  Goal: Skin integrity remains intact  Description: 1.  Monitor for areas of redness and/or skin breakdown  Outcome: Progressing  Flowsheets  Taken 04/24/2024 0019  Skin Integrity Remains Intact: Monitor for areas of redness and/or skin breakdown  Taken 04/23/2024 2109  Skin Integrity Remains Intact: Monitor for areas of redness and/or skin breakdown     Problem: ABCDS Injury Assessment  Goal: Absence of physical injury  Outcome: Progressing  Flowsheets (Taken 04/24/2024 0019)  Absence of Physical Injury: Implement safety measures based on patient assessment     Problem: Safety - Adult  Goal: Free from fall injury  Outcome: Progressing  Flowsheets (Taken 04/24/2024 0019)  Free From Fall Injury: Instruct family/caregiver on patient safety     Problem: Nutrition Deficit:  Goal: Optimize nutritional status  Outcome: Progressing  Flowsheets (Taken 04/24/2024 0314)  Nutrient intake appropriate for improving, restoring, or maintaining nutritional needs:   Assess nutritional status and recommend course of action   Monitor oral intake, labs, and treatment plans     "

## 2024-04-25 LAB — COMPREHENSIVE METABOLIC PANEL W/ REFLEX TO MG FOR LOW K
ALT: 64 U/L — ABNORMAL HIGH (ref 10–50)
AST: 92 U/L — ABNORMAL HIGH (ref 10–50)
Albumin: 3.2 g/dL — ABNORMAL LOW (ref 3.5–5.2)
Alkaline Phosphatase: 55 U/L (ref 40–129)
Anion Gap: 6 mmol/L — ABNORMAL LOW (ref 8–16)
BUN: 11 mg/dL (ref 8–23)
CO2: 28 mmol/L (ref 22–29)
Calcium: 8.6 mg/dL — ABNORMAL LOW (ref 8.8–10.2)
Chloride: 109 mmol/L — ABNORMAL HIGH (ref 98–107)
Creatinine: 0.7 mg/dL (ref 0.7–1.2)
Est, Glom Filt Rate: >90 (ref >60)
Glucose: 127 mg/dL — ABNORMAL HIGH (ref 70–99)
Potassium reflex Magnesium: 3.9 mmol/L (ref 3.5–5.0)
Sodium: 143 mmol/L (ref 136–145)
Total Bilirubin: 0.2 mg/dL (ref 0.2–1.2)
Total Protein: 5.2 g/dL — ABNORMAL LOW (ref 6.4–8.3)

## 2024-04-25 LAB — CBC WITH AUTO DIFFERENTIAL
Basophils %: 1 % (ref 0.0–1.0)
Basophils Absolute: 0 K/uL (ref 0.00–0.20)
Eosinophils %: 4.9 % (ref 0.0–5.0)
Eosinophils Absolute: 0.2 K/uL (ref 0.00–0.60)
Hematocrit: 38.4 % — ABNORMAL LOW (ref 42.0–52.0)
Hemoglobin: 12.3 g/dL — ABNORMAL LOW (ref 14.0–18.0)
Immature Granulocytes #: 0 K/uL
Lymphocytes %: 35.4 % (ref 20.0–40.0)
Lymphocytes Absolute: 1.5 K/uL (ref 1.1–4.5)
MCH: 27.8 pg (ref 27.0–31.0)
MCHC: 32 g/dL — ABNORMAL LOW (ref 33.0–37.0)
MCV: 86.9 fL (ref 80.0–94.0)
MPV: 10 fL (ref 9.4–12.4)
Monocytes %: 7.3 % (ref 0.0–10.0)
Monocytes Absolute: 0.3 K/uL (ref 0.00–0.90)
Neutrophils %: 51.2 % (ref 50.0–65.0)
Neutrophils Absolute: 2.1 K/uL (ref 1.5–7.5)
Platelets: 157 K/uL (ref 130–400)
RBC: 4.42 M/uL — ABNORMAL LOW (ref 4.70–6.10)
RDW: 13.2 % (ref 11.5–14.5)
WBC: 4.1 K/uL — ABNORMAL LOW (ref 4.8–10.8)

## 2024-04-25 MED ORDER — TAMSULOSIN HCL 0.4 MG PO CAPS
0.4 | ORAL_CAPSULE | Freq: Every day | ORAL | 3 refills | Status: AC
Start: 2024-04-25 — End: ?

## 2024-04-25 MED ORDER — QUETIAPINE FUMARATE 25 MG PO TABS
25 | ORAL_TABLET | Freq: Every evening | ORAL | 3 refills | 30.00000 days | Status: DC
Start: 2024-04-25 — End: 2024-05-23

## 2024-04-25 MED ORDER — PANTOPRAZOLE SODIUM 20 MG PO TBEC
20 | ORAL_TABLET | Freq: Every day | ORAL | 0 refills | Status: AC
Start: 2024-04-25 — End: ?

## 2024-04-25 MED FILL — MEMANTINE HCL 5 MG PO TABS: 5 mg | ORAL | Qty: 2

## 2024-04-25 MED FILL — TAMSULOSIN HCL 0.4 MG PO CAPS: 0.4 mg | ORAL | Qty: 2

## 2024-04-25 MED FILL — THIAMINE HCL 100 MG/ML IJ SOLN: 100 mg/mL | INTRAMUSCULAR | Qty: 2

## 2024-04-25 MED FILL — DONEPEZIL HCL 10 MG PO TABS: 10 mg | ORAL | Qty: 1 | Fill #0

## 2024-04-25 MED FILL — PROTONIX 40 MG IV SOLR: 40 mg | INTRAVENOUS | Qty: 40

## 2024-04-25 MED FILL — QUETIAPINE FUMARATE 50 MG PO TABS: 50 mg | ORAL | Qty: 1

## 2024-04-25 MED FILL — MELATONIN 5 MG PO TBDP: 5 mg | ORAL | Qty: 2 | Fill #0

## 2024-04-25 MED FILL — MEMANTINE HCL 5 MG PO TABS: 5 mg | ORAL | Qty: 2 | Fill #0

## 2024-04-25 MED FILL — ASPIRIN 325 MG PO TABS: 325 mg | ORAL | Qty: 1

## 2024-04-25 NOTE — Plan of Care (Signed)
"    Problem: Seizure Precautions  Goal: Remains free of injury related to seizures activity  04/25/2024 0934 by Aura Birchwood, RN  Outcome: Progressing  04/25/2024 0322 by Vernel Avelina LABOR, LPN  Outcome: Progressing  Flowsheets (Taken 04/24/2024 2047)  Remains free of injury related to seizure activity:   Maintain airway, patient safety  and administer oxygen as ordered   Monitor patient for seizure activity, document and report duration and description of seizure to Licensed Independent Practitioner   If seizure occurs, turn patient to side and suction secretions as needed   Reorient patient post seizure   Seizure pads on all 4 side rails   Instruct patient/family to notify RN of any seizure activity   Instruct patient/family to call for assistance with activity based on assessment     Problem: Skin/Tissue Integrity  Goal: Skin integrity remains intact  Description: 1.  Monitor for areas of redness and/or skin breakdown  04/25/2024 0934 by Aura Birchwood, RN  Outcome: Progressing  04/25/2024 0322 by Vernel Avelina LABOR, LPN  Outcome: Progressing  Flowsheets (Taken 04/24/2024 2155)  Skin Integrity Remains Intact: Monitor for areas of redness and/or skin breakdown     Problem: ABCDS Injury Assessment  Goal: Absence of physical injury  04/25/2024 0934 by Aura Birchwood, RN  Outcome: Progressing  04/25/2024 0322 by Vernel Avelina LABOR, LPN  Outcome: Progressing  Flowsheets (Taken 04/24/2024 2326)  Absence of Physical Injury: Implement safety measures based on patient assessment     Problem: Safety - Adult  Goal: Free from fall injury  04/25/2024 0934 by Aura Birchwood, RN  Outcome: Progressing  04/25/2024 0322 by Vernel Avelina LABOR, LPN  Outcome: Progressing  Flowsheets (Taken 04/24/2024 2326)  Free From Fall Injury: Instruct family/caregiver on patient safety     Problem: Nutrition Deficit:  Goal: Optimize nutritional status  04/25/2024 0934 by Aura Birchwood, RN  Outcome: Progressing  04/25/2024 0322 by Vernel Avelina LABOR,  LPN  Outcome: Progressing  Flowsheets (Taken 04/24/2024 0314)  Nutrient intake appropriate for improving, restoring, or maintaining nutritional needs:   Assess nutritional status and recommend course of action   Monitor oral intake, labs, and treatment plans     Problem: Genitourinary - Adult  Goal: Absence of urinary retention  04/25/2024 0934 by Aura Birchwood, RN  Outcome: Progressing  Flowsheets (Taken 04/25/2024 0322 by Vernel Avelina LABOR, LPN)  Absence of urinary retention:   Assess patients ability to void and empty bladder   Place urinary catheter per Licensed Independent Practitioner order if needed   Monitor intake/output and perform bladder scan as needed   Discuss with Licensed Independent Practitioner  medications to alleviate retention as needed  04/25/2024 0322 by Vernel Avelina LABOR, LPN  Outcome: Progressing  Flowsheets (Taken 04/25/2024 0322)  Absence of urinary retention:   Assess patients ability to void and empty bladder   Place urinary catheter per Licensed Independent Practitioner order if needed   Monitor intake/output and perform bladder scan as needed   Discuss with Licensed Independent Practitioner  medications to alleviate retention as needed  Note: Patient has a foley due to urinary retention.  Goal: Urinary catheter remains patent  04/25/2024 0934 by Aura Birchwood, RN  Outcome: Progressing  Flowsheets (Taken 04/25/2024 0322 by Vernel Avelina LABOR, LPN)  Urinary catheter remains patent: Assess patency of urinary catheter  04/25/2024 0322 by Vernel Avelina LABOR, LPN  Outcome: Progressing  Flowsheets (Taken 04/25/2024 0322)  Urinary catheter remains patent: Assess patency of urinary catheter     "

## 2024-04-25 NOTE — Plan of Care (Signed)
 "  Problem: Seizure Precautions  Goal: Remains free of injury related to seizures activity  04/25/2024 1403 by Aura Birchwood, RN  Outcome: Adequate for Discharge  04/25/2024 0934 by Aura Birchwood, RN  Outcome: Progressing  04/25/2024 0322 by Vernel Avelina LABOR, LPN  Outcome: Progressing  Flowsheets (Taken 04/24/2024 2047)  Remains free of injury related to seizure activity:   Maintain airway, patient safety  and administer oxygen as ordered   Monitor patient for seizure activity, document and report duration and description of seizure to Licensed Independent Practitioner   If seizure occurs, turn patient to side and suction secretions as needed   Reorient patient post seizure   Seizure pads on all 4 side rails   Instruct patient/family to notify RN of any seizure activity   Instruct patient/family to call for assistance with activity based on assessment     Problem: Skin/Tissue Integrity  Goal: Skin integrity remains intact  Description: 1.  Monitor for areas of redness and/or skin breakdown  04/25/2024 1403 by Aura Birchwood, RN  Outcome: Adequate for Discharge  04/25/2024 0934 by Aura Birchwood, RN  Outcome: Progressing  04/25/2024 0322 by Vernel Avelina LABOR, LPN  Outcome: Progressing  Flowsheets (Taken 04/24/2024 2155)  Skin Integrity Remains Intact: Monitor for areas of redness and/or skin breakdown     Problem: ABCDS Injury Assessment  Goal: Absence of physical injury  04/25/2024 1403 by Aura Birchwood, RN  Outcome: Adequate for Discharge  04/25/2024 0934 by Aura Birchwood, RN  Outcome: Progressing  04/25/2024 0322 by Vernel Avelina LABOR, LPN  Outcome: Progressing  Flowsheets (Taken 04/24/2024 2326)  Absence of Physical Injury: Implement safety measures based on patient assessment     Problem: Safety - Adult  Goal: Free from fall injury  04/25/2024 1403 by Aura Birchwood, RN  Outcome: Adequate for Discharge  04/25/2024 0934 by Aura Birchwood, RN  Outcome: Progressing  04/25/2024 0322 by Vernel Avelina LABOR,  LPN  Outcome: Progressing  Flowsheets (Taken 04/24/2024 2326)  Free From Fall Injury: Instruct family/caregiver on patient safety     Problem: Nutrition Deficit:  Goal: Optimize nutritional status  04/25/2024 1403 by Aura Birchwood, RN  Outcome: Adequate for Discharge  04/25/2024 0934 by Aura Birchwood, RN  Outcome: Progressing  04/25/2024 0322 by Vernel Avelina LABOR, LPN  Outcome: Progressing  Flowsheets (Taken 04/24/2024 0314)  Nutrient intake appropriate for improving, restoring, or maintaining nutritional needs:   Assess nutritional status and recommend course of action   Monitor oral intake, labs, and treatment plans     Problem: Genitourinary - Adult  Goal: Absence of urinary retention  04/25/2024 1403 by Aura Birchwood, RN  Outcome: Adequate for Discharge  04/25/2024 0934 by Aura Birchwood, RN  Outcome: Progressing  04/25/2024 0322 by Vernel Avelina LABOR, LPN  Outcome: Progressing  Flowsheets (Taken 04/25/2024 0322)  Absence of urinary retention:   Assess patients ability to void and empty bladder   Place urinary catheter per Licensed Independent Practitioner order if needed   Monitor intake/output and perform bladder scan as needed   Discuss with Licensed Independent Practitioner  medications to alleviate retention as needed  Note: Patient has a foley due to urinary retention.  Goal: Urinary catheter remains patent  04/25/2024 1403 by Aura Birchwood, RN  Outcome: Adequate for Discharge  04/25/2024 0934 by Aura Birchwood, RN  Outcome: Progressing  04/25/2024 0322 by Vernel Avelina LABOR, LPN  Outcome: Progressing  Flowsheets (Taken 04/25/2024 9677)  Urinary catheter remains patent: Assess patency of urinary catheter     "

## 2024-04-25 NOTE — Discharge Summary (Signed)
 "Discharge Summary    Date:04/25/2024        Patient Name:Raymond Skinner     Date of Birth:24-May-1955     Age:68 y.o.    Admit Date:04/21/2024   Admission Condition:fair   Discharged Condition:stable  Discharge Date: 04/25/24       Discharge Diagnoses   Principal Problem:    Rhabdomyolysis  Active Problems:    Dementia (HCC)    Alcohol abuse    Cannabis abuse    Urinary tract infection  Resolved Problems:    * No resolved hospital problems. [facility] Stay   Narrative of Hospital Course:     68 year old male with history of ETOH use, Cannabis use and Dementia presenting to the hospital with concerns of AMS and ETOH intoxication. On admission, ETOH levels of 266, CK elevated at 6938, UDS positive for marijuana. CT head with no acute abnormality. Was given IV fluids and admitted in house for further management. In house continued on high rate IVF for rhabdomyolysis with improved Ck levels, renal function remained normal.. No overt ETOH withdrawal symptoms noted, hence CIWA discontinued. Foley placed for urinary retention, SNF to attempt voiding trial in 3-5days as mobility improves, also on flomax . Some sundowning in house and improved with seroquel . Patient to follow up with PCP for continuous management of chronic medical problems.       Physical Examination:  General: Well-developed, no acute distress lying comfortably in bed.  HEENT: Atraumatic normocephalic, range of motion normal, no JVD, no tracheal deviation noted.  Cardiac: Normal S1-S2 no murmurs  Respiratory: clear To auscultation bilaterally, no rhonchi or rales, no wheezing  Abdomen: Soft, positive bowel sounds, no distention, nontender to palpation, no organomegaly noted, no rebound noted.    Extremities: no tenderness, no edema, moves all extremities  Psych: Affect normal and good eye contact, behavioral normal.        Consultants:   IP CONSULT TO SOCIAL WORK  IP CONSULT TO SOCIAL WORK    Time Spent on Discharge:  35 minutes were spent in patient  examination, evaluation, counseling as well as medication reconciliation, prescriptions for required medications, discharge plan and follow up.      Surgeries/Procedures Performed:  NONE     Significant Diagnostic Studies:   Recent Labs:  CBC:   Lab Results   Component Value Date/Time    WBC 4.1 04/25/2024 06:50 AM    RBC 4.42 04/25/2024 06:50 AM    HGB 12.3 04/25/2024 06:50 AM    HCT 38.4 04/25/2024 06:50 AM    MCV 86.9 04/25/2024 06:50 AM    MCH 27.8 04/25/2024 06:50 AM    MCHC 32.0 04/25/2024 06:50 AM    RDW 13.2 04/25/2024 06:50 AM    PLT 157 04/25/2024 06:50 AM     BMP:    Lab Results   Component Value Date/Time    GLUCOSE 127 04/25/2024 06:50 AM    NA 143 04/25/2024 06:50 AM    K 3.9 04/25/2024 06:50 AM    CL 109 04/25/2024 06:50 AM    CO2 28 04/25/2024 06:50 AM    ANIONGAP 6 04/25/2024 06:50 AM    BUN 11 04/25/2024 06:50 AM    CREATININE 0.7 04/25/2024 06:50 AM    CALCIUM  8.6 04/25/2024 06:50 AM    LABGLOM >90 04/25/2024 06:50 AM       Radiology Last 7 Days:  CT HEAD WO CONTRAST  Result Date: 04/21/2024   1. No acute intracranial  abnormality. 2. 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   _________________________________________  All CT scans are performed using dose optimization techniques as appropriate to the performed exam and include at least one of the following: Automated exposure control, adjustment of the mA and/or kV according to size, and the use of iterative reconstruction technique.  ______________________________________ Electronically signed by: OZELL RICKER M.D. Date:     04/21/2024 Time:    13:30       Discharge Plan   Disposition: To a non-[facility] facility    Provider Follow-Up:   No follow-up provider specified.       Patient Instructions   Diet: regular diet    Activity: activity as tolerated      Discharge Medications         Medication List        START taking these medications      pantoprazole  20 MG tablet  Commonly known as: PROTONIX   Take 1  tablet by mouth every morning (before breakfast)     QUEtiapine  25 MG tablet  Commonly known as: SEROQUEL   Take 1 tablet by mouth nightly     tamsulosin  0.4 MG capsule  Commonly known as: FLOMAX   Take 2 capsules by mouth daily  Start taking on: April 26, 2024     vitamin B-6 100 MG tablet  Commonly known as: PYRIDOXINE             CONTINUE taking these medications      aspirin  325 MG tablet  Commonly known as: Bayer Aspirin   Take 1 tablet by mouth daily     cyanocobalamin  1000 MCG tablet     D3 + K2 PO     disulfiram  250 MG tablet  Commonly known as: ANTABUSE   Take 1 tablet by mouth in the morning and at bedtime     donepezil  10 MG tablet  Commonly known as: ARICEPT   Take 1 tablet by mouth nightly     Ginkgo 60 MG Tabs     Melatonin 10 MG Tabs     memantine  10 MG tablet  Commonly known as: NAMENDA   Take 1 tablet by mouth 2 times daily     simvastatin  20 MG tablet  Commonly known as: ZOCOR   Take 1 tablet by mouth nightly     therapeutic multivitamin-minerals tablet               Where to Get Your Medications        These medications were sent to [facility] [place] - [address] - [place] [phone] [place] [phone]  [address], [place] [address]      Phone: [phone]   pantoprazole  20 MG tablet  QUEtiapine  25 MG tablet  tamsulosin  0.4 MG capsule         Electronically signed by Inocente Holmes, MD on 04/25/24 at 11:18 AM CST    "

## 2024-04-25 NOTE — Progress Notes (Signed)
"     04/25/24 0900   Subjective   Subjective Patient standing BS with PCA  going to shower.   Pain No C/O pain   Cognition   Overall Cognitive Status WFL   Orientation   Overall Orientation Status WFL   Vitals   O2 Device None (Room air)   Transfer Training   Transfer Training Yes   Sit to Stand Contact guard assistance   Gait Training   Gait Training Yes   Gait   Gait Training Yes   Overall Level of Assistance Contact guard assistance   Distance (ft) 10 Feet   Assistive Device Walker, rolling   Patient Education   Education Given To Patient   Education Provided Role of Therapy;Plan of Art Therapist;Fall Prevention Strategies   Education Provided Comments Use of call light and staff assist.   Education Method Demonstration;Verbal   Education Outcome Verbalized understanding;Demonstrated understanding;Continued education needed   Other Specialty Interventions   Other Treatments/Modalities In Shower PCA present.   Assessment   Activity Tolerance Patient tolerated treatment well       Electronically signed by Laurier JINNY Sharps, PTA on 04/25/2024 at 9:55 AM   "

## 2024-04-25 NOTE — Plan of Care (Signed)
"    Problem: Seizure Precautions  Goal: Remains free of injury related to seizures activity  Outcome: Progressing  Flowsheets (Taken 04/24/2024 2047)  Remains free of injury related to seizure activity:   Maintain airway, patient safety  and administer oxygen as ordered   Monitor patient for seizure activity, document and report duration and description of seizure to Licensed Independent Practitioner   If seizure occurs, turn patient to side and suction secretions as needed   Reorient patient post seizure   Seizure pads on all 4 side rails   Instruct patient/family to notify RN of any seizure activity   Instruct patient/family to call for assistance with activity based on assessment     Problem: Skin/Tissue Integrity  Goal: Skin integrity remains intact  Description: 1.  Monitor for areas of redness and/or skin breakdown  Outcome: Progressing  Flowsheets (Taken 04/24/2024 2155)  Skin Integrity Remains Intact: Monitor for areas of redness and/or skin breakdown     Problem: ABCDS Injury Assessment  Goal: Absence of physical injury  Outcome: Progressing  Flowsheets (Taken 04/24/2024 2326)  Absence of Physical Injury: Implement safety measures based on patient assessment     Problem: Safety - Adult  Goal: Free from fall injury  Outcome: Progressing  Flowsheets (Taken 04/24/2024 2326)  Free From Fall Injury: Instruct family/caregiver on patient safety     Problem: Nutrition Deficit:  Goal: Optimize nutritional status  Outcome: Progressing  Flowsheets (Taken 04/24/2024 0314)  Nutrient intake appropriate for improving, restoring, or maintaining nutritional needs:   Assess nutritional status and recommend course of action   Monitor oral intake, labs, and treatment plans     Problem: Genitourinary - Adult  Goal: Absence of urinary retention  Outcome: Progressing  Flowsheets (Taken 04/25/2024 0322)  Absence of urinary retention:   Assess patients ability to void and empty bladder   Place urinary catheter per Licensed Independent  Practitioner order if needed   Monitor intake/output and perform bladder scan as needed   Discuss with Licensed Independent Practitioner  medications to alleviate retention as needed  Note: Patient has a foley due to urinary retention.  Goal: Urinary catheter remains patent  Outcome: Progressing  Flowsheets (Taken 04/25/2024 0322)  Urinary catheter remains patent: Assess patency of urinary catheter     "

## 2024-04-25 NOTE — Progress Notes (Signed)
"  Occupational Therapy     04/25/24 1400   Subjective   Subjective Pt sitting up in recliner with PCA assisting to change catheter bag to leg bag.   Pain Assessment   Pain Assessment 0-10   Pain Level 0   Cognition   Overall Cognitive Status Exceptions   Cognition Comment Impulsive, poor problem solving and memory, had difficulty providing accurate details of his home or stating the date.  Pleasant, verbal, and easily engaged in therapeutic activity.   Orientation   Overall Orientation Status X   Orientation Level Oriented to person;Disoriented to time;Disoriented to situation   Bed Mobility Training   Bed Mobility Training No   Transfer Training   Transfer Training Yes   Overall Level of Assistance Minimal assistance;Contact guard assistance   Interventions Verbal cues;Tactile cues;Manual cues;Safety awareness training   Sit to Stand Minimal assistance;Contact guard assistance   Stand to Sit Minimal assistance;Contact guard assistance   Balance   Sitting Intact   Standing With support  (RW)   ADL   Feeding Setup;Supervision   Grooming Setup;Supervision   UE Bathing Setup;Stand by assistance   LE Bathing Minimal assistance   UE Dressing Setup;Stand by assistance   LE Dressing Minimal assistance;Moderate assistance   Putting On/Taking Off Footwear Minimal assistance   Toileting Minimal assistance   Functional Mobility Minimal assistance;Contact guard assistance   Functional Mobility Skilled Clinical Factors for steadying balance at times; posterior lean at times.   Assessment   Assessment Tx focused on performing LB dressing while seated and standing at RW requiring Min-Mod assist. Pt instructed on functional mobility and t/fs in hallway and in room with Min-CGA with cues for safety awareness and steadying balance. Transport came for patient at tx end.   Activity Tolerance Patient tolerated treatment well;Treatment limited secondary to decreased cognition   Discharge Recommendations Patient would benefit from continued  therapy after discharge;24 hour supervision or assist   Occupational Therapy Plan   Times Per Week 3-5   Times Per Day Once a day   OT Plan of Care   Monday X     Electronically signed by Almarie Acre, OTA on 04/25/2024 at 2:49 PM    "

## 2024-04-25 NOTE — Progress Notes (Signed)
"  Report called to [facility] at [facility]  "

## 2024-04-25 NOTE — Care Coordination (Signed)
"  PT/OT evals completed 12/7 and precert started this am 12/8 with [employer].  Awaiting approval/denial. Electronically signed by Recardo Sorrel, RN on 04/25/2024 at 9:54 AM    "

## 2024-04-25 NOTE — Care Coordination (Addendum)
"  Pt's insurance has approved for pt to d/c to [facility].  [address] [facility]  Phone: [phone]  Fax: [phone]  Admissions Fax: [phone]  Pharmacy:  [facility] - [place]  - [place], [place]  Electronically signed by Recardo Sorrel, RN on 04/25/2024 at 11:05 AM      Please fax d/c summary to Admissions fax and call report to facility and ask for [facility]. Electronically signed by Recardo Sorrel, RN on 04/25/2024 at 11:19 AM    "

## 2024-04-25 NOTE — Progress Notes (Signed)
"  Physical Therapy  Name: SHELDON SEM  MRN:  [id number]  Date of service:  04/25/2024       04/25/24 1105   Restrictions/Precautions   Restrictions/Precautions Fall Risk   Activity Level Up with Assist   General   Additional Pertinent Hx right hip fracture s/p THA 01/14/24   Family/Caregiver Present No   Diagnosis Rhabdomyolysis, dementia, alcohol and cannabis use   Subjective   Subjective Why won't that foot work?  I was walking just fine last week.   Vitals   O2 Device None (Room air)   Transfers   Sit to Stand Contact guard assistance   Stand to Sit Contact guard assistance   Ambulation   Surface Level tile   Device Rolling Walker   Assistance Contact guard assistance   Gait Deviations Slow Cadence;Increased BOS;Decreased step length;Decreased step height   Distance 150 feet   Comments He compensated very well for his left foot drop and lack of sensation.   Short Term Goals   Time Frame for Short Term Goals 2 weeks   Short Term Goal 1 Patient will perform independent bed mobility.   Short Term Goal 2 Patient will transfer safely with supervision from a variety of surfaces.   Short Term Goal 3 Patient will ambulate 250 ft with an appropriate assistive device safely with supervision.   Activity Tolerance   Activity Tolerance Patient tolerated treatment well   Assessment   Assessment patient was able to compensated for left foot.  Verbal cues to bend knee and lift hip while performing swing phase on left leg.   Discharge Recommendations Continue to assess pending progress;Patient would benefit from continued therapy after discharge   Physical Therapy Plan   General Plan 5-7 times per week   Therapy Duration 2 Weeks   Current Treatment Recommendations Strengthening;Balance training;Functional mobility training;Transfer training;Neuromuscular re-education;Gait training;Safety education & training;Patient/Caregiver education & training;Therapeutic activities;Endurance training   PT Plan of Care   Monday X   Safety Devices    Type of Devices Left in chair;Call light within reach         Electronically signed by Leita JONETTA Masker, PTA on 04/25/2024 at 2:17 PM    "

## 2024-04-28 NOTE — Telephone Encounter (Signed)
"  Care Transitions Initial Follow Up Call    Patient: Raymond Skinner Patient DOB: 02-02-1956   MRN: [id number]  Reason for Admission: altered mental status  Discharge Date: 04/25/24      Discharge department/facility: [facility] made within 2 business days of discharge: Yes, left message - first attempt.    Scheduled appointment with PCP within 0-14 days    Follow Up  Future Appointments   Date Time Provider Department Center   10/06/2024  9:00 AM Joshua Dorothyann SAILOR, APRN [facility] DEP       Rosina Dage, [place]   "

## 2024-04-29 NOTE — Telephone Encounter (Signed)
"  Care Transitions Initial Follow Up Call    Patient: Raymond Skinner Patient DOB: January 11, 1956   MRN: [id number]  Reason for Admission: altered mental status   Discharge Date: 04/25/24      Discharge department/facility: [facility] made within 2 business days of discharge: Yes, left message - second attempt.    Scheduled appointment with PCP within 0-14 days    Follow Up  Future Appointments   Date Time Provider Department Center   10/06/2024  9:00 AM Joshua Dorothyann SAILOR, APRN [facility] DE[facility]       Rosina Dage, MA   "

## 2024-05-14 ENCOUNTER — Emergency Department: Admit: 2024-05-14 | Payer: Medicare (Managed Care) | Primary: Family

## 2024-05-14 ENCOUNTER — Inpatient Hospital Stay
Admission: EM | Admit: 2024-05-14 | Discharge: 2024-05-23 | Disposition: A | Discharge status: Home / Self Care | Payer: Medicare (Managed Care) | Attending: Internal Medicine | Admitting: Internal Medicine

## 2024-05-14 DIAGNOSIS — T83518A Infection and inflammatory reaction due to other urinary catheter, initial encounter: Secondary | ICD-10-CM

## 2024-05-14 DIAGNOSIS — R410 Disorientation, unspecified: Principal | ICD-10-CM

## 2024-05-14 LAB — RESPIRATORY PANEL, MOLECULAR, WITH COVID-19
Adenovirus by PCR: NOT DETECTED
Bordetella parapertussis by PCR: NOT DETECTED
Bordetella pertussis by PCR: NOT DETECTED
Chlamydophilia pneumoniae by PCR: NOT DETECTED
Coronavirus 229E by PCR: NOT DETECTED
Coronavirus HKU1 by PCR: NOT DETECTED
Coronavirus NL63 by PCR: NOT DETECTED
Coronavirus OC43 by PCR: NOT DETECTED
Human Metapneumovirus by PCR: NOT DETECTED
Human Rhinovirus/Enterovirus by PCR: NOT DETECTED
Influenza A by PCR: NOT DETECTED
Influenza B by PCR: NOT DETECTED
Mycoplasma pneumoniae by PCR: NOT DETECTED
Parainfluenza Virus 1 by PCR: NOT DETECTED
Parainfluenza Virus 2 by PCR: NOT DETECTED
Parainfluenza Virus 3 by PCR: NOT DETECTED
Parainfluenza Virus 4 by PCR: NOT DETECTED
Respiratory Syncytial Virus by PCR: NOT DETECTED
SARS-CoV-2, PCR: NOT DETECTED

## 2024-05-14 LAB — COMPREHENSIVE METABOLIC PANEL W/ REFLEX TO MG FOR LOW K
ALT: 24 U/L (ref 10–50)
AST: 23 U/L (ref 10–50)
Albumin: 4.1 g/dL (ref 3.5–5.2)
Alkaline Phosphatase: 130 U/L — ABNORMAL HIGH (ref 40–129)
Anion Gap: 12 mmol/L (ref 8–16)
BUN: 13 mg/dL (ref 8–23)
CO2: 27 mmol/L (ref 22–29)
Calcium: 9.4 mg/dL (ref 8.8–10.2)
Chloride: 103 mmol/L (ref 98–107)
Creatinine: 0.8 mg/dL (ref 0.7–1.2)
Est, Glom Filt Rate: >90 (ref >60)
Glucose: 145 mg/dL — ABNORMAL HIGH (ref 70–99)
Potassium reflex Magnesium: 4.4 mmol/L (ref 3.5–5.0)
Sodium: 142 mmol/L (ref 136–145)
Total Bilirubin: 0.4 mg/dL (ref 0.2–1.2)
Total Protein: 7.2 g/dL (ref 6.4–8.3)

## 2024-05-14 LAB — URINALYSIS WITH REFLEX TO CULTURE
Bilirubin, Urine: NEGATIVE
Bilirubin, Urine: NEGATIVE
Glucose, Ur: NEGATIVE mg/dL
Glucose, Ur: NEGATIVE mg/dL
Ketones, Urine: 15 mg/dL — AB
Nitrite, Urine: NEGATIVE
Nitrite, Urine: POSITIVE — AB
Protein, UA: 100 mg/dL — AB
Protein, UA: 30 mg/dL — AB
Specific Gravity, UA: 1.017 (ref 1.005–1.030)
Specific Gravity, UA: 1.018 (ref 1.005–1.030)
Urobilinogen, Urine: 1 U/dL (ref <2.0)
Urobilinogen, Urine: 1 U/dL (ref <2.0)
pH, Urine: 6.5 (ref 5.0–8.0)
pH, Urine: 6.5 (ref 5.0–8.0)

## 2024-05-14 LAB — MICROSCOPIC URINALYSIS
Bacteria, UA: NEGATIVE /HPF
Epithelial Cells, UA: 1 /HPF (ref 0–5)
Hyaline Casts, UA: 2 /HPF (ref 0–5)
RBC, UA: 641 /HPF — ABNORMAL HIGH (ref 0–4)
WBC, UA: 41 /HPF — ABNORMAL HIGH (ref 0–5)

## 2024-05-14 LAB — CBC WITH AUTO DIFFERENTIAL
Basophils %: 0.6 % (ref 0.0–1.0)
Basophils Absolute: 0.1 K/uL (ref 0.00–0.20)
Eosinophils %: 1.2 % (ref 0.0–5.0)
Eosinophils Absolute: 0.1 K/uL (ref 0.00–0.60)
Hematocrit: 40.6 % — ABNORMAL LOW (ref 42.0–52.0)
Hemoglobin: 13.9 g/dL — ABNORMAL LOW (ref 14.0–18.0)
Immature Granulocytes #: 0 K/uL
Lymphocytes %: 10.4 % — ABNORMAL LOW (ref 20.0–40.0)
Lymphocytes Absolute: 1.2 K/uL (ref 1.1–4.5)
MCH: 28.5 pg (ref 27.0–31.0)
MCHC: 34.2 g/dL (ref 33.0–37.0)
MCV: 83.4 fL (ref 80.0–94.0)
MPV: 9.2 fL — ABNORMAL LOW (ref 9.4–12.4)
Monocytes %: 7.3 % (ref 0.0–10.0)
Monocytes Absolute: 0.8 K/uL (ref 0.00–0.90)
Neutrophils %: 80.3 % — ABNORMAL HIGH (ref 50.0–65.0)
Neutrophils Absolute: 9.3 K/uL — ABNORMAL HIGH (ref 1.5–7.5)
Platelets: 327 K/uL (ref 130–400)
RBC: 4.87 M/uL (ref 4.70–6.10)
RDW: 13.2 % (ref 11.5–14.5)
WBC: 11.6 K/uL — ABNORMAL HIGH (ref 4.8–10.8)

## 2024-05-14 LAB — LACTIC ACID: Lactic Acid: 0.9 mmol/L (ref 0.5–1.9)

## 2024-05-14 LAB — ETHANOL: Ethanol Lvl: <11 mg/dL (ref 0–11)

## 2024-05-14 LAB — CK: Total CK: 64 U/L (ref 39–308)

## 2024-05-14 LAB — AMMONIA: Ammonia: 13 umol/L — ABNORMAL LOW (ref 16–60)

## 2024-05-14 NOTE — ED Notes (Signed)
"  Notified Case Management   "

## 2024-05-14 NOTE — H&P (Signed)
 "    [employer] - History & Physical      PCP: Joshua Dorothyann SAILOR, APRN    Date of Admission: 05/14/2024    Date of Service: 05/14/2024    Chief Complaint:  Altered mental status    History Of Present Illness:   The patient is a 68 y.o. male with dementia, history of alcohol abuse, recent femur fracture comes to ED for altered mental status. Patient initially presented with family at bedside who stated that patient has been altered for the past 3 days. Patient has been at [facility] for rehab and family is concerned about patient when he is discharged. Patient has had an indwelling catheter since surgery due to urinary retention. Patient was diagnosed with a UTI and has been on oral antibiotics at the facility. Patient remains altered and is unable to tell me why he is at the hospital currently. He has no physical complaints.    In ED: CT head with no acute intracranial process; XR right hip with pelvis revealed hip arthroplasty on the right, no degenerative changes of the spine and pelvis, no displaced fracture evident however there is a subtle degree of lucency in the medial border of the greater trochanter which is a change from prior; CT right hip with no acute fracture; UA with small LE, negative nitrite, 41 WBC, negative bacteria, urine culture pending; PCR respiratory panel negative; WBC 11.6, H&H 13.9/40.6, CMP unremarkable.  Patient will be admitted inpatient to hospitalist.    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 Authorizing Provider   QUEtiapine  (SEROQUEL ) 25 MG tablet Take 1 tablet by mouth nightly 04/25/24   Omorodion, Inocente, MD    tamsulosin  (FLOMAX ) 0.4 MG capsule Take 2 capsules by mouth daily 04/26/24   Omorodion, Inocente, MD   pantoprazole  (PROTONIX ) 20 MG tablet Take 1 tablet by mouth every morning (before breakfast) 04/25/24   Omorodion, Inocente, MD   Melatonin 10 MG TABS Take 10 mg by mouth nightly    [provider]   Vitamin D -Vitamin K (D3 + K2 PO) Take by mouth    [provider]   aspirin  (BAYER ASPIRIN ) 325 MG tablet Take 1 tablet by mouth daily 04/08/24   Joshua Dorothyann SAILOR, APRN   simvastatin  (ZOCOR ) 20 MG tablet Take 1 tablet by mouth nightly 04/08/24   Joshua Dorothyann SAILOR, APRN   donepezil  (ARICEPT ) 10 MG tablet Take 1 tablet by mouth nightly 04/08/24   Joshua Dorothyann SAILOR, APRN   memantine  (NAMENDA ) 10 MG tablet Take 1 tablet by mouth 2 times daily 04/08/24   Joshua Dorothyann SAILOR, APRN   disulfiram  (ANTABUSE ) 250 MG tablet Take 1 tablet by mouth in the morning and at bedtime 04/08/24   Joshua Dorothyann SAILOR, APRN   Multiple Vitamins-Minerals (THERAPEUTIC MULTIVITAMIN-MINERALS) tablet Take 1 tablet by mouth daily    [provider]   vitamin B-6 (PYRIDOXINE ) 100 MG tablet Take 1 tablet by mouth daily  [provider]   cyanocobalamin  1000 MCG tablet Take 2.5 tablets by mouth daily    [provider]   Ginkgo 60 MG TABS Take by mouth    [provider]       Allergies:    Patient has no known allergies.    Social History:    The patient currently lives nursing home.  Tobacco:   reports that he quit smoking about 6 years ago. His smoking use included cigarettes. He started smoking about 49 years ago. He has a 21.2 pack-year smoking history. He has never used smokeless tobacco.  Alcohol:   reports no history of alcohol use.  Illicit Drugs: denies    Family History:      Problem Relation Age of Onset    Diabetes Mother     Dementia Father     No Known Problems Maternal Uncle     Dementia Paternal Aunt     Dementia Maternal Grandmother     Dementia Maternal Grandfather     Dementia  Paternal Grandmother     Dementia Paternal Grandfather        Review of Systems:   Review of Systems   Unable to perform ROS: Dementia      14 point review of systems is negative except as specifically addressed above.    Physical Examination:  BP (!) 177/98   Pulse 76   Temp 99.4 F (37.4 C) (Axillary)   Resp 14   SpO2 95%   Physical Exam  Vitals and nursing note reviewed.   Constitutional:       Appearance: He is ill-appearing.   HENT:      Head: Normocephalic and atraumatic.      Mouth/Throat:      Mouth: Mucous membranes are moist.      Pharynx: Oropharynx is clear.   Eyes:      Pupils: Pupils are equal, round, and reactive to light.   Cardiovascular:      Rate and Rhythm: Normal rate and regular rhythm.      Pulses: Normal pulses.      Heart sounds: Normal heart sounds. No murmur heard.  Pulmonary:      Effort: Pulmonary effort is normal. No respiratory distress.      Breath sounds: Normal breath sounds. No wheezing, rhonchi or rales.   Abdominal:      General: Bowel sounds are normal. There is no distension.      Palpations: Abdomen is soft.      Tenderness: There is no abdominal tenderness. There is no guarding.   Musculoskeletal:         General: Normal range of motion.      Cervical back: Normal range of motion.      Right lower leg: No edema.      Left lower leg: No edema.   Skin:     General: Skin is warm and dry.      Capillary Refill: Capillary refill takes less than 2 seconds.   Neurological:      Mental Status: He is alert and oriented to person, place, and time. Mental status is at baseline.      Motor: No weakness.   Psychiatric:         Attention and Perception: He is inattentive.      Diagnostic Data:  CBC:  Recent Labs     05/14/24  1430   WBC 11.6*   HGB 13.9*   HCT 40.6*   PLT 327  BMP:  Recent Labs     05/14/24  1430   NA 142   K 4.4   CL 103   CO2 27   BUN 13   CREATININE 0.8   CALCIUM  9.4     Recent Labs     05/14/24  1430   AST 23   ALT 24   BILITOT 0.4   ALKPHOS 130*     Cardiac  Enzymes:   Recent Labs     05/14/24  1430   CKTOTAL 64     Urinalysis:  Lab Results   Component Value Date/Time    NITRU Negative 05/14/2024 06:37 PM    WBCUA 41 05/14/2024 06:37 PM    BACTERIA Negative 05/14/2024 06:37 PM    RBCUA 641 05/14/2024 06:37 PM    BLOODU LARGE 05/14/2024 06:37 PM    GLUCOSEU Negative 05/14/2024 06:37 PM     CT HIP RIGHT WO CONTRAST  Result Date: 05/14/2024  EXAM:  CT OF THE RIGHT HIP WITHOUT CONTRAST  TECHNIQUE:  CT of the right hip was performed without IV contrast. Multiplanar reformats were made.  HISTORY:  Fall.  Possible fracture.  COMPARISON:  None.  FINDINGS:  Degenerative changes are present within the sacroiliac joint as well as the visualized lumbar facets.  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  No acute fracture of the posterior aspect of the pelvic ring.   Anterior pelvic ring is intact.  Some enthesophyte formation is present about the greater trochanter.  No periprosthetic fracture evident however.       1. No acute fracture.   All CT scans are performed using dose optimization techniques as appropriate to the performed exam and includes at least one of the following:  Automated exposure control, adjustment of the mA and/or kV according to size, and the use of iterative reconstruction technique.  All CT scans are performed using dose optimization techniques as appropriate to the performed exam and include at least one of the following: Automated exposure control, adjustment of the mA and/or kV according to size, and the use of iterative reconstruction technique.  ______________________________________ Electronically signed by: GUSTAVO HIGHTOWER M.D. Date:     05/14/2024 Time:    17:50     XR HIP 2-3 VW W PELVIS RIGHT  Result Date: 05/14/2024  EXAM:  PELVIS, 1 VIEW;  RIGHT HIP, 2 VIEW  HISTORY:  Fall.  COMPARISON: 01/14/2024  FINDINGS:  Hip arthroplasty is present.  Degenerative changes are present within the pelvis and spine.   Prominent enthesophyte along the iliac crests.  No hardware loosening evident at this time.  Lucency along the medial border of the greater trochanter which is a  slight change from prior.       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  No displaced fracture evident however there is a subtle degree of lucency in the medial border of the greater trochanter which is a change from prior.  If there is concern for occult fracture recommend CT.    ______________________________________ Electronically signed by: GUSTAVO HIGHTOWER M.D. Date:     05/14/2024 Time:    16:52     CT HEAD WO CONTRAST  Result Date: 05/14/2024  EXAMINATION: HEAD CT WITHOUT CONTRAST  HISTORY: Altered mental status  TECHNIQUE: Noncontrast CT of the brain was performed with images acquired from skull base to vertex. 2-D coronal and sagittal reformatted images were obtained from the axial source  images. Contrast Dose: None. CT Dose Reduction Techniques Performed: Yes.  COMPARISON: 04/21/2024  FINDINGS: Topogram demonstrates no significant abnormality. Intraparenchymal hemorrhage: None. Parenchyma: Normal gray-white differentiation.  No mass effect or midline shift. Similar cerebral atrophy and periventricular hypodensities. Extra-axial spaces and basal cisterns: Normal. Ventricles: Normal size and morphology for age. Paranasal sinuses and mastoid air cells: Visualized portions of paranasal sinuses are clear.  Mastoid air cells are clear. Orbits: Normal visualized portions. Sella/Skull Base: Normal. Other: Scalp and visualized soft tissues are normal.  Calvarium is normal.      1.  No acute intracranial process. 2. 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  All CT scans are performed using dose optimization techniques as appropriate to the performed exam and include at least one of the following: Automated exposure control, adjustment of the mA and/or kV according to size, and the use  of iterative reconstruction technique.  ______________________________________ Electronically signed by: GLENDIA BIRK M.D. Date:     05/14/2024 Time:    15:39       Assessment/Plan:  Principal Problem:    UTI (urinary tract infection)  Active Problems:    Dementia (HCC)    Alcohol abuse    Hyperlipidemia    Urinary retention  Resolved Problems:    * No resolved hospital problems. *   Principal Problem:    UTI (urinary tract infection)   - Rocephin  2,000 mg IV   - IVF NS @ 75 cc/hr   - Regular diet   - CBC w/ diff, BMP w/ reflex to mag daily   - Daily weights   - Strict I/O    Active Problems:    Dementia    - Continue aricept , namenda , seroquel       Alcohol abuse   - Continue disulfiram       Hyperlipidemia   - Continue ASA, atorvastatin        Urinary retention   - Continue indwelling foley catheter   - Continue tamsulosin     Resolved Problems:    * No resolved hospital problems. *    DVT prophylaxis: Lovenox  40 mg SQ daily  GI prophylaxis: Patient on home PPI      Signed:  Lauraine DELENA Portugal, APRN - CNP, 05/14/2024 9:06 PM      EMR Dragon/Transcription disclaimer:   Much of this encounter note is an electronic transcription/translation of spoken language to printed text. The electronic translation of spoken language may permit erroneous, or at times, nonsensical words or phrases to be inadvertently transcribed; although attempts have made to review the note for such errors, some may still exist.     "

## 2024-05-14 NOTE — Care Coordination (Signed)
"  SW spoke with [name], Ex SIL at bedside regarding discharge plan. She states that pt currently resides at [facility] and they are looking more for long term.     [name] states she is already in the process of trying to get him traditional medicaid. They just required a 3 midnight stay.     SW will follow   "

## 2024-05-14 NOTE — Progress Notes (Signed)
 "ED TO INPATIENT SBAR HANDOFF    Patient Name: Raymond Skinner   DOB: November 10, 1955  68 y.o.   Family/Caregiver Present: No  Code Status Order: Full Code    C-SSRS: Risk of Suicide: No Risk  Sitter No  Restraints:         Situation  Chief Complaint:   Chief Complaint   Patient presents with    Fall    Altered Mental Status     Pt presents to ED With c/o fall 3 days ago. Did not hit head per EMS AMS LKW last night at 1800. Family states AMS x 3 day. Recent UTI.     Patient Diagnosis: UTI (urinary tract infection) [N39.0]     Brief Description of Patient's Condition: Raymond Skinner is a 68 y.o. male who presents to the emergency department with fall 3 days ago. Mixed report. Per EMS AMS was last night. Recent UTI with indwelling foley. The patient is pleasant and confused for me. Can tell me name and year. Prior hx of right femur fracture he appears contorted baseline.  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 Missie local orthopedics and is doing rehab at [facility] rehab it sounds like it is a 30-day plan and then likely discharge the spouse is worried if she is gena be able to take care of him as he continues to become more confused and is still a high fall risk.  He has basically had a chronic indwelling Foley since she is not sure when this most recent leg bag was changed. In ED foley was changed revealing large clots and poor drainage. CBI approx 700 ml. Draining well. Pending long term placement. Social work is aware   Mental Status: disoriented  Arrived from: nursing home    Imaging:   CT HIP RIGHT WO CONTRAST   Final Result       1. No acute fracture.           All CT scans are performed using dose optimization techniques as appropriate to the performed exam and includes at least one of the following:  Automated exposure control, adjustment of the mA and/or kV  according to size, and the use of iterative    reconstruction technique.        All CT scans are performed using dose optimization techniques as appropriate to the performed exam and include    at least one of the following: Automated exposure control, adjustment of the mA and/or kV according to size, and the use of iterative reconstruction technique.        ______________________________________    Electronically signed by: GUSTAVO HIGHTOWER M.D.   Date:     05/14/2024   Time:    17:50       XR HIP 2-3 VW W PELVIS RIGHT   Final Result       Hip arthroplasty on the right       Degenerative changes of the spine and pelvis.       No displaced fracture evident however there is a subtle degree of lucency in the medial border of the greater trochanter which is a change from prior.  If there is concern for occult fracture recommend CT.               ______________________________________  Electronically signed by: GUSTAVO HIGHTOWER M.D.   Date:     05/14/2024   Time:    16:52       CT HEAD WO CONTRAST   Final Result   1.  No acute intracranial process.   2. 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        All CT scans are performed using dose optimization techniques as appropriate to the performed exam and include    at least one of the following: Automated exposure control, adjustment of the mA and/or kV according to size, and the use of iterative reconstruction technique.        ______________________________________    Electronically signed by: GLENDIA BIRK M.D.   Date:     05/14/2024   Time:    15:39         COVID-19 Results:   Internal Administration   First Dose      Second Dose           Last COVID Lab SARS-CoV-2, PCR (no units)   Date Value   05/14/2024 Not Detected           Abnormal labs:   Abnormal Labs Reviewed   CBC WITH AUTO DIFFERENTIAL - Abnormal; Notable for the following components:       Result Value    WBC 11.6 (*)     Hemoglobin 13.9 (*)     Hematocrit 40.6 (*)     MPV  9.2 (*)     Neutrophils % 80.3 (*)     Lymphocytes % 10.4 (*)     Neutrophils Absolute 9.3 (*)     All other components within normal limits   COMPREHENSIVE METABOLIC PANEL W/ REFLEX TO MG FOR LOW K - Abnormal; Notable for the following components:    Glucose 145 (*)     Alkaline Phosphatase 130 (*)     All other components within normal limits   URINALYSIS WITH REFLEX TO CULTURE - Abnormal; Notable for the following components:    Clarity, UA TURBID (*)     Blood, Urine LARGE (*)     Protein, UA 30 (*)     Nitrite, Urine POSITIVE (*)     Leukocyte Esterase, Urine LARGE (*)     All other components within normal limits   MICROSCOPIC URINALYSIS - Abnormal; Notable for the following components:    WBC, UA TNTC (*)     RBC, UA TNTC (*)     Bacteria, UA 4+ (*)     Amorphous, UA 2+ (*)     Crystals, UA 1+ Ca. Oxalate (*)     Crystals, UA 1+ Ca. Phos (*)     All other components within normal limits   URINALYSIS WITH REFLEX TO CULTURE - Abnormal; Notable for the following components:    Clarity, UA CLOUDY (*)     Ketones, Urine 15 (*)     Blood, Urine LARGE (*)     Protein, UA 100 (*)     Leukocyte Esterase, Urine SMALL (*)     All other components within normal limits   AMMONIA - Abnormal; Notable for the following components:    Ammonia 13 (*)     All other components within normal limits   MICROSCOPIC URINALYSIS - Abnormal; Notable for the following components:    WBC, UA 41 (*)     RBC, UA 641 (*)     All other components within normal limits  Background  Allergies: No Known Allergies  Current Medications:   Medications Administered         cefTRIAXone  (ROCEPHIN ) 2,000 mg in sterile water  20 mL IV syringe Admin Date  05/14/2024 Action  Given Dose  2,000 mg Route  IntraVENous Documented By  Keenan Maffucci, RN            History:   Past Medical History:   Diagnosis Date    Dementia (HCC)     Elbert Shine infection     as a child       Assessment  Vitals: Level of Consciousness: Responds to Voice (1)   Vitals:     05/14/24 2028 05/14/24 2058 05/14/24 2128 05/14/24 2158   BP: (!) 162/90 (!) 156/144 (!) 165/92 (!) 163/88   Pulse: 82 75 80 76   Resp:       Temp:       TempSrc:       SpO2: 93% 96% 97% 97%     Predictive Model Details          36 (Caution)  Factor Value    Calculated 05/14/2024 22:39 32% Glasgow coma scale 13    Deterioration Index Model 79% Age 85 years old     10% Respiratory rate 14     10% Systolic 163     7% WBC count abnormal (11.6 K/uL)     6% Potassium 4.4 mmol/L     2% Sodium 142 mmol/L     1% Temperature 99.4 F (37.4 C)     1% Hematocrit abnormal (40.6 %)     0% Pulse oximetry 97 %     0% Pulse 76       NPO? Yes  O2 Flow Rate:      Cardiac Rhythm: nsr  NIH Score: NIH     Active LDA's:   Peripheral IV 05/14/24 Left Antecubital (Active)   Site Assessment Clean, dry & intact 05/14/24 1447   Line Status Normal saline locked 05/14/24 1447   Phlebitis Assessment No symptoms 05/14/24 1447     Pertinent or High Risk Medications/Drips: no   If Yes, please provide details:   Blood Product Administration: no  If Yes, please provide details:   Sepsis Risk Score      Admitted with Sepsis? No    Recommendation  Incomplete orders:   Patient Belongings:   Additional Comments:   If any further questions, please call Sending RN at [phone]      Electronically signed by: Electronically signed by Maffucci Keenan, RN on 05/14/2024 at 10:39 PM   "

## 2024-05-14 NOTE — ED Provider Notes (Signed)
 "[facility] EMERGENCY DEPARTMENT  eMERGENCYdEPARTMENT eNCOUnter      [name]  MRN: [id number]  Birthdate 1956-05-09  Date of evaluation: 05/14/2024  Provider:Elga Santy Anola, APRN - CNP    Emergency Department care of this patient was assumed at 1720 from Carliss Fair, [place].  We have discussed the case and the plan of care.  I have seen and evaluated patient and reviewed ED course.      CHIEF COMPLAINT       Chief Complaint   Patient presents with    Fall    Altered Mental Status     Pt presents to ED With c/o fall 3 days ago. Did not hit head per EMS AMS LKW last night at 1800. Family states AMS x 3 day. Recent UTI.         PHYSICAL EXAM    (up to 7 for level 4, 8 or more for level 5)     ED Triage Vitals   BP Girls Systolic BP Percentile Girls Diastolic BP Percentile Boys Systolic BP Percentile Boys Diastolic BP Percentile Temp Temp Source Pulse   05/14/24 1439 -- -- -- -- 05/14/24 1450 05/14/24 1450 05/14/24 1439   (!) 177/98     99.4 F (37.4 C) Axillary 99      Respirations SpO2 Height Weight       05/14/24 1439 05/14/24 1439 -- --       21 98 %             Physical Exam  Patient was resting vital signs were stable, catheter was patent, see full physical exam and previous providers note.     DIAGNOSTIC RESULTS         RADIOLOGY:   Non-plain film imagessuch as CT, Ultrasound and MRI are read by the radiologist. Plain radiographic images are visualized and preliminarily interpreted by the emergency physician with the below findings:    CT of hip showed no acute fracture, CT of head showed no acute processes.      CT HIP RIGHT WO CONTRAST   Final Result       1. No acute fracture.           All CT scans are performed using dose optimization techniques as appropriate to the performed exam and includes at least one of the following:  Automated exposure control, adjustment of the mA and/or kV according to size, and the use of iterative    reconstruction technique.        All CT scans are performed using dose  optimization techniques as appropriate to the performed exam and include    at least one of the following: Automated exposure control, adjustment of the mA and/or kV according to size, and the use of iterative reconstruction technique.        ______________________________________    Electronically signed by: GUSTAVO HIGHTOWER M.D.   Date:     05/14/2024   Time:    17:50       XR HIP 2-3 VW W PELVIS RIGHT   Final Result       Hip arthroplasty on the right       Degenerative changes of the spine and pelvis.       No displaced fracture evident however there is a subtle degree of lucency in the medial border of the greater trochanter which is a change from prior.  If there is concern for occult fracture recommend CT.  ______________________________________    Electronically signed by: GUSTAVO HIGHTOWER M.D.   Date:     05/14/2024   Time:    16:52       CT HEAD WO CONTRAST   Final Result   1.  No acute intracranial process.   2. 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        All CT scans are performed using dose optimization techniques as appropriate to the performed exam and include    at least one of the following: Automated exposure control, adjustment of the mA and/or kV according to size, and the use of iterative reconstruction technique.        ______________________________________    Electronically signed by: GLENDIA BIRK M.D.   Date:     05/14/2024   Time:    15:39               LABS:  Labs Reviewed   CBC WITH AUTO DIFFERENTIAL - Abnormal; Notable for the following components:       Result Value    WBC 11.6 (*)     Hemoglobin 13.9 (*)     Hematocrit 40.6 (*)     MPV 9.2 (*)     Neutrophils % 80.3 (*)     Lymphocytes % 10.4 (*)     Neutrophils Absolute 9.3 (*)     All other components within normal limits   COMPREHENSIVE METABOLIC PANEL W/ REFLEX TO MG FOR LOW K - Abnormal; Notable for the following components:    Glucose 145 (*)     Alkaline Phosphatase 130 (*)      All other components within normal limits   URINALYSIS WITH REFLEX TO CULTURE - Abnormal; Notable for the following components:    Clarity, UA TURBID (*)     Blood, Urine LARGE (*)     Protein, UA 30 (*)     Nitrite, Urine POSITIVE (*)     Leukocyte Esterase, Urine LARGE (*)     All other components within normal limits   MICROSCOPIC URINALYSIS - Abnormal; Notable for the following components:    WBC, UA TNTC (*)     RBC, UA TNTC (*)     Bacteria, UA 4+ (*)     Amorphous, UA 2+ (*)     Crystals, UA 1+ Ca. Oxalate (*)     Crystals, UA 1+ Ca. Phos (*)     All other components within normal limits   URINALYSIS WITH REFLEX TO CULTURE - Abnormal; Notable for the following components:    Clarity, UA CLOUDY (*)     Ketones, Urine 15 (*)     Blood, Urine LARGE (*)     Protein, UA 100 (*)     Leukocyte Esterase, Urine SMALL (*)     All other components within normal limits   AMMONIA - Abnormal; Notable for the following components:    Ammonia 13 (*)     All other components within normal limits   MICROSCOPIC URINALYSIS - Abnormal; Notable for the following components:    WBC, UA 41 (*)     RBC, UA 641 (*)     All other components within normal limits   RESPIRATORY PANEL, MOLECULAR, WITH COVID-19   CULTURE, URINE   CULTURE, URINE   LACTIC ACID   CK   ETHANOL       All other labs were within normal range or not returned as of this dictation.    EMERGENCY  DEPARTMENT COURSE and DIFFERENTIAL DIAGNOSIS/MDM:   Vitals:    Vitals:    05/14/24 1439 05/14/24 1450 05/14/24 1524   BP: (!) 177/98     Pulse: 99  76   Resp: 21  14   Temp:  99.4 F (37.4 C)    TempSrc:  Axillary    SpO2: 98%  95%       MDM  Differential diagnosis: Renal calculi, pyelonephritis, intracranial hemorrhage and others      Patient appears to have failed outpatient antibiotic treatment, has increased confusion unable to bear weight and has had multiple falls at inpatient rehab.  Power of attorney-ex sister-in-law stated that she would like to have patient admitted  for long-term placement related to ongoing dementia, increased falls.    Reassessment      CONSULTS:  None    PROCEDURES:  Unless otherwise noted below, none     Procedures    FINAL IMPRESSION    No diagnosis found.      DISPOSITION/PLAN   DISPOSITION     PATIENT REFERRED TO:  No follow-up provider specified.    DISCHARGE MEDICATIONS:  New Prescriptions    No medications on file          (Please note that portions ofthis note were completed with a voice recognition program.  Efforts were made to edit the dictations but occasionally words are mis-transcribed.)    Marty Cantor, APRN - CNP(electronically signed)  Attending Emergency Physician         Cantor Marty, APRN - CNP  05/14/24 2202    "

## 2024-05-14 NOTE — ED Provider Notes (Signed)
 "  [facility] ONCOLOGY UNIT  eMERGENCYdEPARTMENT eNCOUnter      [name]  MRN: [id number]  Birthdate 12-08-1955  Date of evaluation: 05/14/2024  Provider:Athalee Esterline, PA    CHIEF COMPLAINT       Chief Complaint   Patient presents with    Fall    Altered Mental Status     Pt presents to ED With c/o fall 3 days ago. Did not hit head per EMS AMS LKW last night at 1800. Family states AMS x 3 day. Recent UTI.         HISTORY OF PRESENT ILLNESS  (Location/Symptom, Timing/Onset, Context/Setting, Quality, Duration, Modifying Factors, Severity.)   Raymond Skinner is a 68 y.o. male who presents to the emergency department with fall 3 days ago. Mixed report. Per EMS AMS was last night. Recent UTI with indwelling foley. The patient is pleasant and confused for me. Can tell me name and year. Prior hx of right femur fracture he appears contorted baseline.  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 Missie local orthopedics and is doing rehab at [facility] rehab it sounds like it is a 30-day plan and then likely discharge the spouse is worried if she is gena be able to take care of him as he continues to become more confused and is still a high fall risk.  He has basically had a chronic indwelling Foley since she is not sure when this most recent leg bag was changed.    HPI    Nursing Notes were reviewed and I agree.    REVIEW OF SYSTEMS    (2-9 systems for level 4, 10 or more for level 5)     Review of Systems   Constitutional:  Negative for activity change, appetite change, chills and fever.   HENT:  Negative for congestion and dental problem.    Eyes:  Negative for photophobia, discharge and itching.   Respiratory:  Negative for apnea, cough and shortness of breath.    Cardiovascular:  Negative for chest pain.   Musculoskeletal:  Negative for arthralgias, back pain, gait  problem, myalgias and neck pain.   Skin:  Negative for color change, pallor and rash.   Neurological:  Negative for dizziness, seizures and syncope.   Psychiatric/Behavioral:  Positive for confusion. Negative for agitation. The patient is not nervous/anxious.         Except as noted above the remainder of the review of systems was reviewed and negative.       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         SURGICAL HISTORY       Past Surgical History:   Procedure Laterality Date    TOTAL HIP ARTHROPLASTY Right 01/14/2024    HIP TOTAL ARTHROPLASTY performed by Dozier Gilmore DASEN, MD at [facility] OR         CURRENT MEDICATIONS       Current Discharge Medication List        CONTINUE these medications which have NOT CHANGED    Details   tamsulosin  (FLOMAX ) 0.4 MG capsule Take 2 capsules by mouth daily  Qty: 30 capsule,  Refills: 3      Vitamin D -Vitamin K (D3 + K2 PO) Take by mouth    Associated Diagnoses: Severe dementia with other behavioral disturbance, unspecified dementia type (HCC)      aspirin  (BAYER ASPIRIN ) 325 MG tablet Take 1 tablet by mouth daily  Qty: 30 tablet, Refills: 11    Associated Diagnoses: S/P total right hip arthroplasty      simvastatin  (ZOCOR ) 20 MG tablet Take 1 tablet by mouth nightly  Qty: 30 tablet, Refills: 11    Associated Diagnoses: S/P total right hip arthroplasty      donepezil  (ARICEPT ) 10 MG tablet Take 1 tablet by mouth nightly  Qty: 30 tablet, Refills: 11      disulfiram  (ANTABUSE ) 250 MG tablet Take 1 tablet by mouth in the morning and at bedtime  Qty: 60 tablet, Refills: 11    Associated Diagnoses: Alcohol abuse      vitamin B-6 (PYRIDOXINE ) 100 MG tablet Take 1 tablet by mouth daily      cyanocobalamin  1000 MCG tablet Take 2.5 tablets by mouth daily      Ginkgo 60 MG TABS Take by mouth      QUEtiapine  (SEROQUEL ) 25 MG tablet Take 1 tablet by mouth nightly  Qty: 60 tablet, Refills: 3      pantoprazole   (PROTONIX ) 20 MG tablet Take 1 tablet by mouth every morning (before breakfast)  Qty: 30 tablet, Refills: 0      Melatonin 10 MG TABS Take 10 mg by mouth nightly      memantine  (NAMENDA ) 10 MG tablet Take 1 tablet by mouth 2 times daily  Qty: 60 tablet, Refills: 11      Multiple Vitamins-Minerals (THERAPEUTIC MULTIVITAMIN-MINERALS) tablet Take 1 tablet by mouth daily             ALLERGIES     Patient has no known allergies.    FAMILY HISTORY       Family History   Problem Relation Age of Onset    Diabetes Mother     Dementia Father     No Known Problems Maternal Uncle     Dementia Paternal Aunt     Dementia Maternal Grandmother     Dementia Maternal Grandfather     Dementia Paternal Grandmother     Dementia Paternal Grandfather           SOCIAL HISTORY       Social History     Socioeconomic History    Marital status: Divorced   Tobacco Use    Smoking status: Former     Current packs/day: 0.00     Average packs/day: 0.5 packs/day for 42.4 years (21.2 ttl pk-yrs)     Types: Cigarettes     Start date: 12/31/1974     Quit date: 2019     Years since quitting: 6.9    Smokeless tobacco: Never   Vaping Use    Vaping status: Never Used   Substance and Sexual Activity    Alcohol use: Never    Drug use: Yes     Types: Marijuana Oda)   Social History Narrative    CODE STATUS: Full Code    HEALTH CARE PROXY: Mrs. [employer], a relative, [phone]    DOMICILED: with family at the bedside but planning for a nursing home     Social Drivers of Health     Financial Resource Strain: Low Risk  (11/05/2022)    Overall Financial Resource Strain (CARDIA)  Difficulty of Paying Living Expenses: Not hard at all   Food Insecurity: Patient Unable To Answer (05/15/2024)    Hunger Vital Sign     Worried About Running Out of Food in the Last Year: Patient unable to answer     Ran Out of Food in the Last Year: Patient unable to answer   Transportation Needs: Patient Unable To Answer (05/15/2024)    PRAPARE - Transportation     Lack of  Transportation (Medical): Patient unable to answer     Lack of Transportation (Non-Medical): Patient unable to answer   Physical Activity: Insufficiently Active (01/08/2024)    Exercise Vital Sign     Days of Exercise per Week: 5 days     Minutes of Exercise per Session: 10 min   Housing Stability: Patient Unable To Answer (05/15/2024)    Housing Stability Vital Sign     Unable to Pay for Housing in the Last Year: Patient unable to answer     Number of Times Moved in the Last Year: 2     Homeless in the Last Year: Patient unable to answer       SCREENINGS   NIH Stroke Scale  NIH Stroke Scale Assessed: NoGlasgow Coma Scale  Eye Opening: Spontaneous  Best Verbal Response: Confused  Best Motor Response: Obeys commands  Glasgow Coma Scale Score: 14      PHYSICAL EXAM    (up to 7 forlevel 4, 8 or more for level 5)     ED Triage Vitals   BP Girls Systolic BP Percentile Girls Diastolic BP Percentile Boys Systolic BP Percentile Boys Diastolic BP Percentile Temp Temp Source Pulse   05/14/24 1439 -- -- -- -- 05/14/24 1450 05/14/24 1450 05/14/24 1439   (!) 177/98     99.4 F (37.4 C) Axillary 99      Respirations SpO2 Height Weight       05/14/24 1439 05/14/24 1439 -- --       21 98 %             Physical Exam  Constitutional:       General: He is not in acute distress.     Appearance: He is well-developed. He is not diaphoretic.   HENT:      Head: Normocephalic and atraumatic.      Right Ear: External ear normal.      Left Ear: External ear normal.   Eyes:      Pupils: Pupils are equal, round, and reactive to light.   Neck:      Trachea: No tracheal deviation.   Cardiovascular:      Rate and Rhythm: Normal rate and regular rhythm.      Heart sounds: Normal heart sounds. No murmur heard.  Pulmonary:      Effort: Pulmonary effort is normal.      Breath sounds: Normal breath sounds. No stridor. No wheezing.   Chest:      Chest wall: No tenderness.   Abdominal:      General: Bowel sounds are normal. There is no distension.       Palpations: Abdomen is soft.      Tenderness: There is no abdominal tenderness.   Musculoskeletal:         General: Normal range of motion.      Cervical back: Normal range of motion and neck supple.   Skin:     General: Skin is warm and dry.      Capillary Refill: Capillary  refill takes less than 2 seconds.   Neurological:      Mental Status: He is alert and oriented to person, place, and time.      GCS: GCS eye subscore is 4. GCS verbal subscore is 4. GCS motor subscore is 6.   Psychiatric:         Behavior: Behavior normal.           DIAGNOSTIC RESULTS     RADIOLOGY:   Non-plain film images such as CT, Ultrasound and MRI are read by the radiologist. Plain radiographic images are visualized and preliminarilyinterpreted by Mearl Gee, MD with the below findings:        Interpretation per the Radiologist below, if available at the time of this note:    CT HIP RIGHT WO CONTRAST   Final Result       1. No acute fracture.           All CT scans are performed using dose optimization techniques as appropriate to the performed exam and includes at least one of the following:  Automated exposure control, adjustment of the mA and/or kV according to size, and the use of iterative    reconstruction technique.        All CT scans are performed using dose optimization techniques as appropriate to the performed exam and include    at least one of the following: Automated exposure control, adjustment of the mA and/or kV according to size, and the use of iterative reconstruction technique.        ______________________________________    Electronically signed by: GUSTAVO HIGHTOWER M.D.   Date:     05/14/2024   Time:    17:50       XR HIP 2-3 VW W PELVIS RIGHT   Final Result       Hip arthroplasty on the right       Degenerative changes of the spine and pelvis.       No displaced fracture evident however there is a subtle degree of lucency in the medial border of the greater trochanter which is a change from prior.  If there is  concern for occult fracture recommend CT.               ______________________________________    Electronically signed by: GUSTAVO HIGHTOWER M.D.   Date:     05/14/2024   Time:    16:52       CT HEAD WO CONTRAST   Final Result   1.  No acute intracranial process.   2. 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        All CT scans are performed using dose optimization techniques as appropriate to the performed exam and include    at least one of the following: Automated exposure control, adjustment of the mA and/or kV according to size, and the use of iterative reconstruction technique.        ______________________________________    Electronically signed by: GLENDIA BIRK M.D.   Date:     05/14/2024   Time:    15:39           LABS:  Labs Reviewed   CBC WITH AUTO DIFFERENTIAL - Abnormal; Notable for the following components:       Result Value    WBC 11.6 (*)     Hemoglobin 13.9 (*)     Hematocrit 40.6 (*)     MPV 9.2 (*)  Neutrophils % 80.3 (*)     Lymphocytes % 10.4 (*)     Neutrophils Absolute 9.3 (*)     All other components within normal limits   COMPREHENSIVE METABOLIC PANEL W/ REFLEX TO MG FOR LOW K - Abnormal; Notable for the following components:    Glucose 145 (*)     Alkaline Phosphatase 130 (*)     All other components within normal limits   URINALYSIS WITH REFLEX TO CULTURE - Abnormal; Notable for the following components:    Clarity, UA TURBID (*)     Blood, Urine LARGE (*)     Protein, UA 30 (*)     Nitrite, Urine POSITIVE (*)     Leukocyte Esterase, Urine LARGE (*)     All other components within normal limits   MICROSCOPIC URINALYSIS - Abnormal; Notable for the following components:    WBC, UA TNTC (*)     RBC, UA TNTC (*)     Bacteria, UA 4+ (*)     Amorphous, UA 2+ (*)     Crystals, UA 1+ Ca. Oxalate (*)     Crystals, UA 1+ Ca. Phos (*)     All other components within normal limits   URINALYSIS WITH REFLEX TO CULTURE - Abnormal; Notable for the following  components:    Clarity, UA CLOUDY (*)     Ketones, Urine 15 (*)     Blood, Urine LARGE (*)     Protein, UA 100 (*)     Leukocyte Esterase, Urine SMALL (*)     All other components within normal limits   AMMONIA - Abnormal; Notable for the following components:    Ammonia 13 (*)     All other components within normal limits   MICROSCOPIC URINALYSIS - Abnormal; Notable for the following components:    WBC, UA 41 (*)     RBC, UA 641 (*)     All other components within normal limits   BASIC METABOLIC PANEL W/ REFLEX TO MG FOR LOW K - Abnormal; Notable for the following components:    Glucose 129 (*)     All other components within normal limits   CBC WITH AUTO DIFFERENTIAL - Abnormal; Notable for the following components:    Hemoglobin 13.5 (*)     Hematocrit 40.7 (*)     MPV 8.9 (*)     Neutrophils % 66.1 (*)     All other components within normal limits   POCT GLUCOSE - Abnormal; Notable for the following components:    POC Glucose 118 (*)     All other components within normal limits   RESPIRATORY PANEL, MOLECULAR, WITH COVID-19   CULTURE, URINE    Narrative:     ORDER#: [id number]                          ORDERED BY: Dorthula Bier  SOURCE: Urine Clean Catch                  COLLECTED:  05/14/24 14:30  ANTIBIOTICS AT COLL.:                      RECEIVED :  05/14/24 14:54   CULTURE, URINE    Narrative:     ORDER#: [id number]                          ORDERED BY: LATANYA, Shamera Yarberry  SOURCE: Urine Clean Catch                  COLLECTED:  05/14/24 18:37  ANTIBIOTICS AT COLL.:                      RECEIVED :  05/14/24 18:40   LACTIC ACID   CK   ETHANOL   TSH       All other labs were within normal range or notreturned as of this dictation.    RE-ASSESSMENT          EMERGENCY DEPARTMENT COURSE and DIFFERENTIAL DIAGNOSIS/MDM:   Vitals:    Vitals:    05/15/24 0432 05/15/24 0818 05/15/24 1010 05/15/24 1123   BP: (!) 170/99 (!) 149/95  (!) 156/93   Pulse: 91 85  80   Resp: 24      Temp: 97.2 F (36.2 C) 98.1 F (36.7 C)  97.2 F (36.2  C)   TempSrc: Temporal Temporal  Temporal   SpO2: 95% 98% 96% 100%   Weight:       Height:             MDM  At this time waiting on CT rule out for any hairline fracture.  We have done some gentle CBI and patient is voiding with no hematuria or extensive blood clots we will assess with that urine sample from new Foley if this is UTI induced or not.  The goal for admission based on worsening condition and spouse concern for him not really improving with rehab doing a short-term 30-day stay.  Chiquita the nurse practitioner night shift is taking over at this time will follow on imaging.       PROCEDURES:    Procedures      FINAL IMPRESSION      1. Disorientation    2. Urinary tract infection associated with catheterization of urinary tract, unspecified indwelling urinary catheter type, initial encounter    3. Hematuria, unspecified type    4. Multiple falls          DISPOSITION/PLAN   DISPOSITION Admitted 05/14/2024 09:21:22 PM               PATIENT REFERRED TO:  No follow-up provider specified.    DISCHARGE MEDICATIONS:  Current Discharge Medication List          (Please note that portions of this note were completed with a voice recognition program.  Efforts were made to edit the dictations but occasionallywords are mis-transcribed.)    Carliss Fair, PA      Fair [place], [place]  05/15/24 1615    "

## 2024-05-15 ENCOUNTER — Inpatient Hospital Stay: Admit: 2024-05-15 | Payer: Medicare (Managed Care) | Primary: Family

## 2024-05-15 LAB — CBC WITH AUTO DIFFERENTIAL
Basophils %: 0.8 % (ref 0.0–1.0)
Basophils Absolute: 0.1 K/uL (ref 0.00–0.20)
Eosinophils %: 2.8 % (ref 0.0–5.0)
Eosinophils Absolute: 0.2 K/uL (ref 0.00–0.60)
Hematocrit: 40.7 % — ABNORMAL LOW (ref 42.0–52.0)
Hemoglobin: 13.5 g/dL — ABNORMAL LOW (ref 14.0–18.0)
Immature Granulocytes #: 0 K/uL
Lymphocytes %: 21.3 % (ref 20.0–40.0)
Lymphocytes Absolute: 1.8 K/uL (ref 1.1–4.5)
MCH: 27.8 pg (ref 27.0–31.0)
MCHC: 33.2 g/dL (ref 33.0–37.0)
MCV: 83.7 fL (ref 80.0–94.0)
MPV: 8.9 fL — ABNORMAL LOW (ref 9.4–12.4)
Monocytes %: 8.8 % (ref 0.0–10.0)
Monocytes Absolute: 0.7 K/uL (ref 0.00–0.90)
Neutrophils %: 66.1 % — ABNORMAL HIGH (ref 50.0–65.0)
Neutrophils Absolute: 5.5 K/uL (ref 1.5–7.5)
Platelets: 303 K/uL (ref 130–400)
RBC: 4.86 M/uL (ref 4.70–6.10)
RDW: 13.2 % (ref 11.5–14.5)
WBC: 8.3 K/uL (ref 4.8–10.8)

## 2024-05-15 LAB — POCT GLUCOSE
POC Glucose: 118 mg/dL — ABNORMAL HIGH (ref 70–99)
POC Glucose: 107 mg/dL — ABNORMAL HIGH (ref 70–99)

## 2024-05-15 LAB — BASIC METABOLIC PANEL W/ REFLEX TO MG FOR LOW K
Anion Gap: 14 mmol/L (ref 8–16)
BUN: 13 mg/dL (ref 8–23)
CO2: 24 mmol/L (ref 22–29)
Calcium: 9.2 mg/dL (ref 8.8–10.2)
Chloride: 102 mmol/L (ref 98–107)
Creatinine: 0.7 mg/dL (ref 0.7–1.2)
Est, Glom Filt Rate: >90 (ref >60)
Glucose: 129 mg/dL — ABNORMAL HIGH (ref 70–99)
Potassium reflex Magnesium: 4 mmol/L (ref 3.5–5.0)
Sodium: 140 mmol/L (ref 136–145)

## 2024-05-15 LAB — TSH: TSH: 0.547 u[IU]/mL (ref 0.270–4.200)

## 2024-05-15 MED ORDER — SODIUM CHLORIDE 0.9 % IV SOLN
0.9 | INTRAVENOUS | Status: DC | PRN
Start: 2024-05-15 — End: 2024-05-23
  Administered 2024-05-22: 12:00:00 via INTRAVENOUS

## 2024-05-15 MED ORDER — ACETAMINOPHEN 325 MG PO TABS
325 | Freq: Four times a day (QID) | ORAL | Status: DC | PRN
Start: 2024-05-15 — End: 2024-05-23

## 2024-05-15 MED ORDER — ASPIRIN 325 MG PO TABS
325 | Freq: Every day | ORAL | Status: DC
Start: 2024-05-15 — End: 2024-05-23
  Administered 2024-05-15 – 2024-05-23 (×9): 325 mg via ORAL

## 2024-05-15 MED ORDER — HYDRALAZINE HCL 20 MG/ML IJ SOLN
20 | Freq: Four times a day (QID) | INTRAMUSCULAR | Status: DC | PRN
Start: 2024-05-15 — End: 2024-05-23
  Administered 2024-05-16 – 2024-05-18 (×6): 10 mg via INTRAVENOUS

## 2024-05-15 MED ORDER — ONDANSETRON HCL 4 MG/2ML IJ SOLN
4 | Freq: Four times a day (QID) | INTRAMUSCULAR | Status: DC | PRN
Start: 2024-05-15 — End: 2024-05-23

## 2024-05-15 MED ORDER — ONDANSETRON 4 MG PO TBDP
4 | Freq: Three times a day (TID) | ORAL | Status: DC | PRN
Start: 2024-05-15 — End: 2024-05-23

## 2024-05-15 MED ORDER — SODIUM CHLORIDE 0.9 % IV SOLN
0.9 | INTRAVENOUS | Status: AC
Start: 2024-05-15 — End: 2024-05-16
  Administered 2024-05-15 (×2): via INTRAVENOUS

## 2024-05-15 MED ORDER — NORMAL SALINE FLUSH 0.9 % IV SOLN
0.9 | INTRAVENOUS | Status: DC | PRN
Start: 2024-05-15 — End: 2024-05-23

## 2024-05-15 MED ORDER — ACETAMINOPHEN 650 MG RE SUPP
650 | Freq: Four times a day (QID) | RECTAL | Status: DC | PRN
Start: 2024-05-15 — End: 2024-05-23

## 2024-05-15 MED ORDER — STERILE WATER FOR INJECTION (MIXTURES ONLY)
2 | Freq: Once | INTRAMUSCULAR | Status: AC
Start: 2024-05-15 — End: 2024-05-14
  Administered 2024-05-15: 03:00:00 2000 mg via INTRAVENOUS

## 2024-05-15 MED ORDER — STERILE WATER FOR INJECTION (MIXTURES ONLY)
2 | INTRAMUSCULAR | Status: DC
Start: 2024-05-15 — End: 2024-05-18
  Administered 2024-05-16 – 2024-05-18 (×3): 2000 mg via INTRAVENOUS

## 2024-05-15 MED ORDER — NORMAL SALINE FLUSH 0.9 % IV SOLN
0.9 | Freq: Two times a day (BID) | INTRAVENOUS | Status: DC
Start: 2024-05-15 — End: 2024-05-23
  Administered 2024-05-15 – 2024-05-22 (×11): 10 mL via INTRAVENOUS

## 2024-05-15 MED ORDER — DISULFIRAM 250 MG PO TABS
250 | Freq: Two times a day (BID) | ORAL | Status: DC
Start: 2024-05-15 — End: 2024-05-23
  Administered 2024-05-15 – 2024-05-23 (×16): 250 mg via ORAL

## 2024-05-15 MED ORDER — ENOXAPARIN SODIUM 40 MG/0.4ML IJ SOSY
40 | Freq: Every day | INTRAMUSCULAR | Status: DC
Start: 2024-05-15 — End: 2024-05-23
  Administered 2024-05-15 – 2024-05-22 (×8): 40 mg via SUBCUTANEOUS

## 2024-05-15 MED ORDER — QUETIAPINE FUMARATE 50 MG PO TABS
50 | Freq: Every evening | ORAL | Status: DC
Start: 2024-05-15 — End: 2024-05-18
  Administered 2024-05-16 – 2024-05-18 (×3): 25 mg via ORAL

## 2024-05-15 MED ORDER — ATORVASTATIN CALCIUM 10 MG PO TABS
10 | Freq: Every day | ORAL | Status: DC
Start: 2024-05-15 — End: 2024-05-23
  Administered 2024-05-15 – 2024-05-23 (×9): 10 mg via ORAL

## 2024-05-15 MED ORDER — MEMANTINE HCL 5 MG PO TABS
5 | Freq: Two times a day (BID) | ORAL | Status: DC
Start: 2024-05-15 — End: 2024-05-23

## 2024-05-15 MED ORDER — DONEPEZIL HCL 10 MG PO TABS
10 | Freq: Every evening | ORAL | Status: DC
Start: 2024-05-15 — End: 2024-05-23
  Administered 2024-05-16 – 2024-05-23 (×7): 10 mg via ORAL

## 2024-05-15 MED ORDER — TAMSULOSIN HCL 0.4 MG PO CAPS
0.4 | Freq: Every day | ORAL | Status: DC
Start: 2024-05-15 — End: 2024-05-23
  Administered 2024-05-15 – 2024-05-23 (×9): 0.8 mg via ORAL

## 2024-05-15 MED ORDER — POLYETHYLENE GLYCOL 3350 17 G PO PACK
17 | Freq: Every day | ORAL | Status: DC | PRN
Start: 2024-05-15 — End: 2024-05-23
  Administered 2024-05-23: 13:00:00 17 g via ORAL

## 2024-05-15 MED ORDER — STERILE WATER FOR INJECTION (MIXTURES ONLY)
2 | INTRAMUSCULAR | Status: DC
Start: 2024-05-15 — End: 2024-05-15

## 2024-05-15 MED ORDER — PANTOPRAZOLE SODIUM 20 MG PO TBEC
20 | Freq: Every day | ORAL | Status: DC
Start: 2024-05-15 — End: 2024-05-23
  Administered 2024-05-16 – 2024-05-23 (×5): 20 mg via ORAL

## 2024-05-15 MED FILL — DISULFIRAM 250 MG PO TABS: 250 mg | ORAL | Qty: 1 | Fill #0

## 2024-05-15 MED FILL — ASPIRIN 325 MG PO TABS: 325 mg | ORAL | Qty: 1 | Fill #0

## 2024-05-15 MED FILL — PANTOPRAZOLE SODIUM 20 MG PO TBEC: 20 mg | ORAL | Qty: 1 | Fill #0

## 2024-05-15 MED FILL — ENOXAPARIN SODIUM 40 MG/0.4ML IJ SOSY: 40 MG/0.4ML | INTRAMUSCULAR | Qty: 0.4 | Fill #0

## 2024-05-15 MED FILL — CEFTRIAXONE SODIUM 2 G IJ SOLR: 2 g | INTRAMUSCULAR | Qty: 2000 | Fill #0

## 2024-05-15 MED FILL — ATORVASTATIN CALCIUM 10 MG PO TABS: 10 mg | ORAL | Qty: 1 | Fill #0

## 2024-05-15 MED FILL — TAMSULOSIN HCL 0.4 MG PO CAPS: 0.4 mg | ORAL | Qty: 2 | Fill #0

## 2024-05-15 NOTE — Progress Notes (Signed)
 "  [facility]      Progress Note    Patient:  Raymond Skinner  Date of Birth: 12-22-1955  Date of Service: 05/15/2024  MRN: [id number]   Acct: [id number]   Primary Care Physician: Joshua Dorothyann SAILOR, APRN  Advance Directive: Full Code  Admit Date: 05/14/2024       Hospital Day: 1    Portions of this note have been copied forward, however, updated to reflect the most current clinical status of this patient.     CHIEF COMPLAINT Altered mental status     SUBJECTIVE:  Mr. Wieting was A&O x 3, some confusion at times. Denies fever or chills. Denies nausea or vomiting. Denies abdominal pain.       CUMULATIVE HOSPITAL COURSE:   The patient is a 68 y.o. male with PMH of Dementia, alcohol abuse, recent femur fracture who presented to [facility] ED from SNF for evaluation of altered mental status. Reportedly family stated patient had been altered for the past 3 days. Patient has had an indwelling catheter since surgery due to urinary retention. Patient was diagnosed with UTI and was started on oral antibiotic at the facility. Foley catheter was exchanged in ED per nursing.   Workup in ED revealed CT head with no acute intracranial process; XR right hip with pelvis revealed hip arthroplasty on the right, no degenerative changes of the spine and pelvis, no displaced fracture evident however there is a subtle degree of lucency in the medial border of the greater trochanter which is a change from prior; CT right hip with no acute fracture; UA with small LE, negative nitrite, 41 WBC, negative bacteria, urine culture pending; PCR respiratory panel negative; WBC 11.6, H&H 13.9/40.6, CMP unremarkable.  Patient was admitted to hospital medicine for further evaluation.  IV Rocephin  was initiated.  Urine culture indicated growth to be on, additional incubation required.      Review of Systems   Unable to perform ROS: Dementia        Objective:   VITALS:  BP (!) 156/93   Pulse 80   Temp 97.2 F (36.2 C) (Temporal)   Resp 24   Ht 1.829 m  (6')   Wt 71.1 kg (156 lb 12 oz)   SpO2 100%   BMI 21.26 kg/m   24HR INTAKE/OUTPUT:    Intake/Output Summary (Last 24 hours) at 05/15/2024 1306  Last data filed at 05/15/2024 0940  Gross per 24 hour   Intake 353.75 ml   Output 920 ml   Net -566.25 ml         Physical Exam  Constitutional:       General: He is not in acute distress.     Appearance: Normal appearance. He is not ill-appearing.   HENT:      Head: Normocephalic and atraumatic.      Right Ear: External ear normal.      Left Ear: External ear normal.      Nose: Nose normal.      Mouth/Throat:      Mouth: Mucous membranes are moist.   Eyes:      Extraocular Movements: Extraocular movements intact.      Conjunctiva/sclera: Conjunctivae normal.      Pupils: Pupils are equal, round, and reactive to light.   Cardiovascular:      Rate and Rhythm: Normal rate and regular rhythm.      Pulses: Normal pulses.      Heart sounds: Normal heart sounds.   Pulmonary:  Effort: Pulmonary effort is normal. No respiratory distress.      Breath sounds: Normal breath sounds. No wheezing, rhonchi or rales.   Abdominal:      General: Bowel sounds are normal. There is no distension.      Palpations: Abdomen is soft.      Tenderness: There is no abdominal tenderness.   Genitourinary:     Comments: Foley cath with cloudy yellow urine noted  Musculoskeletal:         General: No swelling, tenderness or deformity. Normal range of motion.      Cervical back: Normal range of motion and neck supple. No muscular tenderness.      Right lower leg: No edema.      Left lower leg: No edema.   Skin:     General: Skin is warm and dry.      Findings: No bruising or lesion.   Neurological:      Mental Status: He is alert and oriented to person, place, and time.      Comments: Dementia  A&O x 3   Pleasantly confused at times    Psychiatric:         Mood and Affect: Mood normal.         Behavior: Behavior normal.         Thought Content: Thought content normal.            Medications:       sodium chloride       sodium chloride  75 mL/hr at 05/15/24 0012      cefTRIAXone  (ROCEPHIN ) IV  2,000 mg IntraVENous Q24H    aspirin   325 mg Oral Daily    disulfiram   250 mg Oral BID    donepezil   10 mg Oral Nightly    [Held by provider] memantine   10 mg Oral BID    pantoprazole   20 mg Oral QAM AC    QUEtiapine   25 mg Oral Nightly    atorvastatin   10 mg Oral Daily    tamsulosin   0.8 mg Oral Daily    sodium chloride  flush  5-40 mL IntraVENous 2 times per day    enoxaparin   40 mg SubCUTAneous Daily     hydrALAZINE , sodium chloride  flush, sodium chloride , ondansetron  **OR** ondansetron , acetaminophen  **OR** acetaminophen , polyethylene glycol  Diet NPO Exceptions are: Sips of Water  with Meds     Lab and other Data:     Recent Labs     05/14/24  1430 05/15/24  0434   WBC 11.6* 8.3   HGB 13.9* 13.5*   PLT 327 303     Recent Labs     05/14/24  1430 05/15/24  0434   NA 142 140   K 4.4 4.0   CL 103 102   CO2 27 24   BUN 13 13   CREATININE 0.8 0.7   GLUCOSE 145* 129*     Recent Labs     05/14/24  1430   AST 23   ALT 24   BILITOT 0.4   ALKPHOS 130*     Troponin T: No results for input(s): TROPONINI in the last 72 hours.  Pro-BNP: No results for input(s): BNP in the last 72 hours.  INR: No results for input(s): INR in the last 72 hours.  UA:  Recent Labs     05/14/24  1430 05/14/24  1837   COLORU Yellow Yellow   PHUR 6.5 6.5   WBCUA TNTC* 41*   RBCUA TNTC* 641*  BACTERIA 4+* Negative   CLARITYU TURBID* CLOUDY*   LEUKOCYTESUR LARGE* SMALL*   UROBILINOGEN 1.0 1.0   BILIRUBINUR Negative Negative   BLOODU LARGE* LARGE*   GLUCOSEU Negative Negative   AMORPHOUS 2+*  --      A1C: No results for input(s): LABA1C in the last 72 hours.  ABG:No results for input(s): PHART, PCO2ART, PO2ART, HCO3ART, BEART, HGBAE, O2SATART, CARBOXHGBART in the last 72 hours.    RAD:   CT HIP RIGHT WO CONTRAST  Result Date: 05/14/2024  EXAM:  CT OF THE RIGHT HIP WITHOUT CONTRAST  TECHNIQUE:  CT of the right hip was performed without  IV contrast. Multiplanar reformats were made.  HISTORY:  Fall.  Possible fracture.  COMPARISON:  None.  FINDINGS:  Degenerative changes are present within the sacroiliac joint as well as the visualized lumbar facets.  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  No acute fracture of the posterior aspect of the pelvic ring.   Anterior pelvic ring is intact.  Some enthesophyte formation is present about the greater trochanter.  No periprosthetic fracture evident however.       1. No acute fracture.   All CT scans are performed using dose optimization techniques as appropriate to the performed exam and includes at least one of the following:  Automated exposure control, adjustment of the mA and/or kV according to size, and the use of iterative reconstruction technique.  All CT scans are performed using dose optimization techniques as appropriate to the performed exam and include at least one of the following: Automated exposure control, adjustment of the mA and/or kV according to size, and the use of iterative reconstruction technique.  ______________________________________ Electronically signed by: GUSTAVO HIGHTOWER M.D. Date:     05/14/2024 Time:    17:50     XR HIP 2-3 VW W PELVIS RIGHT  Result Date: 05/14/2024  EXAM:  PELVIS, 1 VIEW;  RIGHT HIP, 2 VIEW  HISTORY:  Fall.  COMPARISON: 01/14/2024  FINDINGS:  Hip arthroplasty is present.  Degenerative changes are present within the pelvis and spine.  Prominent enthesophyte along the iliac crests.  No hardware loosening evident at this time.  Lucency along the medial border of the greater trochanter which is a  slight change from prior.       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  No displaced fracture evident however there is a subtle degree of lucency in the medial border of the greater trochanter which is a change from prior.  If there is concern for occult fracture recommend CT.     ______________________________________ Electronically signed by: GUSTAVO HIGHTOWER M.D. Date:     05/14/2024 Time:    16:52     CT HEAD WO CONTRAST  Result Date: 05/14/2024  EXAMINATION: HEAD CT WITHOUT CONTRAST  HISTORY: Altered mental status  TECHNIQUE: Noncontrast CT of the brain was performed with images acquired from skull base to vertex. 2-D coronal and sagittal reformatted images were obtained from the axial source images. Contrast Dose: None. CT Dose Reduction Techniques Performed: Yes.  COMPARISON: 04/21/2024  FINDINGS: Topogram demonstrates no significant abnormality. Intraparenchymal hemorrhage: None. Parenchyma: Normal gray-white differentiation.  No mass effect or midline shift. Similar cerebral atrophy and periventricular hypodensities. Extra-axial spaces and basal cisterns: Normal. Ventricles: Normal size and morphology for age. Paranasal sinuses and mastoid air cells: Visualized portions of paranasal sinuses are clear.  Mastoid air cells are clear. Orbits: Normal visualized  portions. Sella/Skull Base: Normal. Other: Scalp and visualized soft tissues are normal.  Calvarium is normal.      1.  No acute intracranial process. 2. 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  All CT scans are performed using dose optimization techniques as appropriate to the performed exam and include at least one of the following: Automated exposure control, adjustment of the mA and/or kV according to size, and the use of iterative reconstruction technique.  ______________________________________ Electronically signed by: GLENDIA BIRK M.D. Date:     05/14/2024 Time:    15:39           Micro:    Results       Procedure Component Value Units Date/Time    Culture, Urine [[id number]] Collected: 05/14/24 1837    Order Status: Completed Specimen: Urine, Clean Catch Updated: 05/15/24 1243     Urine Culture, Routine Growth too young, additional incubation required    Narrative:      ORDER#:  [id number]                          ORDERED BY: NANCE, DEREK  SOURCE: Urine Clean Catch                  COLLECTED:  05/14/24 18:37  ANTIBIOTICS AT COLL.:                      RECEIVED :  05/14/24 18:40    Culture, Urine [[id number]] Collected: 05/14/24 1430    Order Status: Completed Specimen: Urine, Clean Catch Updated: 05/15/24 1243     Urine Culture, Routine Growth too young, additional incubation required    Narrative:      ORDER#: [id number]                          ORDERED BY: NANCE, DEREK  SOURCE: Urine Clean Catch                  COLLECTED:  05/14/24 14:30  ANTIBIOTICS AT COLL.:                      RECEIVED :  05/14/24 14:54    Respiratory Panel, Molecular, with COVID-19 (Restricted: peds pts or suitable admitted adults) [[id number]] Collected: 05/14/24 1325    Order Status: Completed Specimen: Nasopharynx Updated: 05/14/24 1649     Adenovirus by PCR Not Detected     Bordetella parapertussis by PCR Not Detected     Bordetella pertussis by PCR Not Detected     Chlamydophilia pneumoniae by PCR Not Detected     Coronavirus 229E by PCR Not Detected     Coronavirus HKU1 by PCR Not Detected     Coronavirus NL63 by PCR Not Detected     Coronavirus OC43 by PCR Not Detected     SARS-CoV-2, PCR Not Detected     Human Metapneumovirus by PCR Not Detected     Human Rhinovirus/Enterovirus by PCR Not Detected     Influenza A by PCR Not Detected     Influenza B by PCR Not Detected     Mycoplasma pneumoniae by PCR Not Detected     Parainfluenza Virus 1 by PCR Not Detected     Parainfluenza Virus 2 by PCR Not Detected     Parainfluenza Virus 3 by PCR Not Detected     Parainfluenza Virus 4 by PCR Not  Detected     Respiratory Syncytial Virus by PCR Not Detected             Assessment/Plan   Principal Problem:    UTI (urinary tract infection)  Active Problems:    Dementia (HCC)    Alcohol abuse    Hyperlipidemia    Urinary retention  Resolved Problems:    * No resolved hospital problems. *      Principal Problem:    UTI  (urinary tract infection)-    - Continue IV Rocephin     - Urine culture indicated growth to urine, additional incubation required   - Monitor I's and O's closely         Active Problems:    Dementia (HCC)-    - Continue Aricept , and Seroquel     - Hold Namenda     - Fall precautions    - PT/OT/SLP       Alcohol abuse-    - Continue disulfiram         Hyperlipidemia-    - Continue ASA and statin       Urinary retention-    - Continue indwelling foley cath    - continue Flomax           Antibiotic: Rocephin       DVT Prophylaxis: Lovenox       GI prophylaxis:  Protonix       Discharge planning: TBD        Further Orders per Clinical course/attending.     Electronically signed by Sallee KATHEE Blanch, APRN - CNP on 05/15/2024 at 1:06 PM       EMR Dragon/Transcription disclaimer:   Much of this encounter note is an electronic transcription/translation of spoken language to printed text. The electronic translation of spoken language may permit erroneous, or at times, nonsensical words or phrases to be inadvertently transcribed; although attempts have made to review the note for such errors, some may still exist.  "

## 2024-05-15 NOTE — Progress Notes (Signed)
"  Hard to even take b/p patient will not hold still due to mental status, making all pressures seem high.  "

## 2024-05-15 NOTE — Progress Notes (Signed)
"  Raymond Skinner arrived to room # 430.   Presented with: AMS  Mental Status: Patient is disoriented.   Vitals:    05/15/24 0432   BP: (!) 170/99   Pulse: 91   Resp: 24   Temp: 97.2 F (36.2 C)   SpO2: 95%     Patient safety contract and falls prevention contract reviewed with patient Yes.  Oriented Patient to room.  Call light within reach. Yes.  Needs, issues or concerns expressed at this time: no.      Electronically signed by Consuelo JULIANNA Blush, RN on 05/15/2024 at 6:29 AM  "

## 2024-05-15 NOTE — Plan of Care (Signed)
"    Problem: Safety - Adult  Goal: Free from fall injury  Outcome: Progressing     Problem: Cardiovascular - Adult  Goal: Maintains optimal cardiac output and hemodynamic stability  Outcome: Progressing  Goal: Absence of cardiac dysrhythmias or at baseline  Outcome: Progressing     Problem: Skin/Tissue Integrity - Adult  Goal: Skin integrity remains intact  Description: 1.  Monitor for areas of redness and/or skin breakdown  2.  Assess vascular access sites hourly  3.  Every 4-6 hours minimum:  Change oxygen saturation probe site  4.  Every 4-6 hours:  If on nasal continuous positive airway pressure, respiratory therapy assess nares and determine need for appliance change or resting period  Outcome: Progressing  Flowsheets (Taken 05/15/2024 0000)  Skin Integrity Remains Intact:   Monitor for areas of redness and/or skin breakdown   Assess vascular access sites hourly   Every 4-6 hours minimum:  Change oxygen saturation probe site   Every 4-6 hours:  If on nasal continuous positive airway pressure, assess nares and determine need for appliance change or resting period   Turn and reposition as indicated   Monitor skin under medical devices  Goal: Incisions, wounds, or drain sites healing without S/S of infection  Outcome: Progressing     Problem: Gastrointestinal - Adult  Goal: Maintains or returns to baseline bowel function  Outcome: Progressing     Problem: Genitourinary - Adult  Goal: Absence of urinary retention  Outcome: Progressing  Goal: Urinary catheter remains patent  Outcome: Progressing     Problem: Metabolic/Fluid and Electrolytes - Adult  Goal: Electrolytes maintained within normal limits  Outcome: Progressing  Goal: Glucose maintained within prescribed range  Outcome: Progressing     Problem: Skin/Tissue Integrity  Goal: Skin integrity remains intact  Description: 1.  Monitor for areas of redness and/or skin breakdown  2.  Assess vascular access sites hourly  3.  Every 4-6 hours minimum:  Change oxygen  saturation probe site  4.  Every 4-6 hours:  If on nasal continuous positive airway pressure, respiratory therapy assess nares and determine need for appliance change or resting period  Outcome: Progressing  Flowsheets (Taken 05/15/2024 0000)  Skin Integrity Remains Intact:   Monitor for areas of redness and/or skin breakdown   Assess vascular access sites hourly   Every 4-6 hours minimum:  Change oxygen saturation probe site   Every 4-6 hours:  If on nasal continuous positive airway pressure, assess nares and determine need for appliance change or resting period   Turn and reposition as indicated   Monitor skin under medical devices     "

## 2024-05-15 NOTE — Progress Notes (Signed)
 "Social Drivers of Health     Tobacco Use: Medium Risk (04/08/2024)    Patient History     Smoking Tobacco Use: Former     Smokeless Tobacco Use: Never     Passive Exposure: Not on file   Alcohol Use: Not At Risk (01/08/2024)    AUDIT-C     Frequency of Alcohol Consumption: Never     Average Number of Drinks: Patient does not drink     Frequency of Binge Drinking: Never   Financial Resource Strain: Low Risk  (11/05/2022)    Overall Financial Resource Strain (CARDIA)     Difficulty of Paying Living Expenses: Not hard at all   Food Insecurity: Patient Unable To Answer (05/15/2024)    Hunger Vital Sign     Worried About Running Out of Food in the Last Year: Patient unable to answer     Ran Out of Food in the Last Year: Patient unable to answer   Transportation Needs: Patient Unable To Answer (05/15/2024)    PRAPARE - Transportation     Lack of Transportation (Medical): Patient unable to answer     Lack of Transportation (Non-Medical): Patient unable to answer   Physical Activity: Insufficiently Active (01/08/2024)    Exercise Vital Sign     Days of Exercise per Week: 5 days     Minutes of Exercise per Session: 10 min   Stress: Not on file   Social Connections: Not on file   Intimate Partner Violence: Not on file   Depression: Not at risk (02/29/2024)    PHQ-2     PHQ-2 Score: 0   Recent Concern: Depression - At risk (01/08/2024)    PHQ-2     PHQ-2 Score: 3   Housing Stability: Patient Unable To Answer (05/15/2024)    Housing Stability Vital Sign     Unable to Pay for Housing in the Last Year: Patient unable to answer     Number of Times Moved in the Last Year: 2     Homeless in the Last Year: Patient unable to answer   Interpersonal Safety: Patient Unable To Answer (05/15/2024)    Interpersonal Safety Domain Source: IP Abuse Screening     Physical abuse: Unable to assess     Verbal abuse: Unable to assess     Emotional abuse: Unable to assess     Financial abuse: Unable to assess     Sexual abuse: Unable to assess    Utilities: Patient Declined (05/15/2024)    [facility] Utilities     Threatened with loss of utilities: Patient declined     Depression screening from [facility] wheel:         PHQ-9 Score (if applicable):         SDOH: Patient Able to answer Social Determinant of Health screening questions.     History    Tobacco Use      Smoking status: Former        Packs/day: 0.00        Years: 0.5 packs/day for 42.4 years (21.2 ttl pk-yrs)        Types: Cigarettes        Start date: 12/31/1974        Quit date: 2019        Years since quitting: 6.9      Smokeless tobacco: Never    E-cigarette/Vaping       Questions Responses    E-cigarette/Vaping Use Never User    Start Date  Passive Exposure     Quit Date     Counseling Given     Comments             Social History     Substance and Sexual Activity   Drug Use Yes    Types: Marijuana (Weed)      HX: Patient UNAble to answer Tobacco History and/or Substance use screening questions.     Psychosocial: Abuse Screening  Physical abuse: Unable to assess  Verbal abuse: Unable to assess  Emotional abuse: Unable to assess  Financial abuse: Unable to assess  Sexual abuse: Unable to assess  Possible abuse reported to: None needed    Functional Screening: ADL Screening  Because of a physical, mental, or emotional condition, do you have serious difficulty concentrating, remembering, or making decisions? Yes  Because of a physical, mental, or emotional condition, do you have difficulty doing errands alone such as visiting a doctor's office or shopping? Yes    Utilities  In the past 12 months, has the electric, gas, oil or water  company threatened to shut off services in your home? Patient unable to answer    Education  Do you want help with school or training? For example, starting or completing job training or getting a high school diploma, GED, or equivalent? Patient unable to answer  Do you speak a language other than English at home? Patient unable to answer    Employment  Do you want help  finding or keeping work or a job?Patient unable to answer    Safety  How often does anyone including family and friends, physically hurt you? Patient unable to answer  How often does anyone including family and friends, insult or talk down to you? Patient unable to answer  How often does anyone including family and friends, threaten you with harm? Patient unable to answer  How often does anyone including family and friends, scream or curse at you? Patient unable to answer    Basic Daily Needs  Think about the place you live. Do you have problems with any of the following?Unable to answer    Family & Community Support  How often do you feel lonely or isolated from those around you? Patient unable to answer    Care Management consult needed? Yes        "

## 2024-05-16 LAB — CBC WITH AUTO DIFFERENTIAL
Basophils %: 1.1 % — ABNORMAL HIGH (ref 0.0–1.0)
Basophils Absolute: 0.1 K/uL (ref 0.00–0.20)
Eosinophils %: 3.8 % (ref 0.0–5.0)
Eosinophils Absolute: 0.2 K/uL (ref 0.00–0.60)
Hematocrit: 42.9 % (ref 42.0–52.0)
Hemoglobin: 14.4 g/dL (ref 14.0–18.0)
Immature Granulocytes #: 0 K/uL
Lymphocytes %: 18.8 % — ABNORMAL LOW (ref 20.0–40.0)
Lymphocytes Absolute: 1.2 K/uL (ref 1.1–4.5)
MCH: 28.3 pg (ref 27.0–31.0)
MCHC: 33.6 g/dL (ref 33.0–37.0)
MCV: 84.4 fL (ref 80.0–94.0)
MPV: 9.7 fL (ref 9.4–12.4)
Monocytes %: 8.9 % (ref 0.0–10.0)
Monocytes Absolute: 0.6 K/uL (ref 0.00–0.90)
Neutrophils %: 67.1 % — ABNORMAL HIGH (ref 50.0–65.0)
Neutrophils Absolute: 4.2 K/uL (ref 1.5–7.5)
Platelets: 282 K/uL (ref 130–400)
RBC: 5.08 M/uL (ref 4.70–6.10)
RDW: 13 % (ref 11.5–14.5)
WBC: 6.3 K/uL (ref 4.8–10.8)

## 2024-05-16 LAB — BASIC METABOLIC PANEL W/ REFLEX TO MG FOR LOW K
Anion Gap: 17 mmol/L — ABNORMAL HIGH (ref 8–16)
BUN: 12 mg/dL (ref 8–23)
CO2: 20 mmol/L — ABNORMAL LOW (ref 22–29)
Calcium: 9.2 mg/dL (ref 8.8–10.2)
Chloride: 102 mmol/L (ref 98–107)
Creatinine: 0.6 mg/dL — ABNORMAL LOW (ref 0.7–1.2)
Est, Glom Filt Rate: >90 (ref >60)
Glucose: 111 mg/dL — ABNORMAL HIGH (ref 70–99)
Potassium reflex Magnesium: 4 mmol/L (ref 3.5–5.0)
Sodium: 139 mmol/L (ref 136–145)

## 2024-05-16 MED ORDER — LISINOPRIL 10 MG PO TABS
10 | Freq: Every day | ORAL | Status: DC
Start: 2024-05-16 — End: 2024-05-20
  Administered 2024-05-16 – 2024-05-20 (×5): 10 mg via ORAL

## 2024-05-16 MED ORDER — VANCOMYCIN INTERMITTENT DOSING (PLACEHOLDER)
INTRAVENOUS | Status: DC
Start: 2024-05-16 — End: 2024-05-18

## 2024-05-16 MED ORDER — SENNA-DOCUSATE SODIUM 8.6-50 MG PO TABS
8.6-50 | Freq: Two times a day (BID) | ORAL | Status: DC
Start: 2024-05-16 — End: 2024-05-18
  Administered 2024-05-16 – 2024-05-18 (×3): 2 via ORAL

## 2024-05-16 MED ORDER — POLYETHYLENE GLYCOL 3350 17 G PO PACK
17 | Freq: Every day | ORAL | Status: DC
Start: 2024-05-16 — End: 2024-05-18
  Administered 2024-05-16 – 2024-05-17 (×2): 17 g via ORAL

## 2024-05-16 MED ORDER — VANCOMYCIN HCL 1 G IV SOLR
1 | Freq: Two times a day (BID) | INTRAVENOUS | Status: DC
Start: 2024-05-16 — End: 2024-05-18
  Administered 2024-05-17 – 2024-05-18 (×3): 1250 mg via INTRAVENOUS

## 2024-05-16 MED ORDER — VANCOMYCIN INTERMITTENT DOSING (PLACEHOLDER)
INTRAVENOUS | Status: DC
Start: 2024-05-16 — End: 2024-05-16

## 2024-05-16 MED ORDER — VANCOMYCIN HCL 1 G IV SOLR
1 | Freq: Once | INTRAVENOUS | Status: AC
Start: 2024-05-16 — End: 2024-05-16
  Administered 2024-05-16: 18:00:00 1750 mg/kg via INTRAVENOUS

## 2024-05-16 MED FILL — HYDRALAZINE HCL 20 MG/ML IJ SOLN: 20 mg/mL | INTRAMUSCULAR | Qty: 1 | Fill #0

## 2024-05-16 MED FILL — STIMULANT LAXATIVE 8.6-50 MG PO TABS: 8.6-50 mg | ORAL | Qty: 2 | Fill #0

## 2024-05-16 MED FILL — VANCOMYCIN HCL 10 G IV SOLR: 10 g | INTRAVENOUS | Qty: 1250 | Fill #0

## 2024-05-16 MED FILL — PANTOPRAZOLE SODIUM 20 MG PO TBEC: 20 mg | ORAL | Qty: 1 | Fill #0

## 2024-05-16 MED FILL — ENOXAPARIN SODIUM 40 MG/0.4ML IJ SOSY: 40 MG/0.4ML | INTRAMUSCULAR | Qty: 0.4 | Fill #0

## 2024-05-16 MED FILL — QUETIAPINE FUMARATE 50 MG PO TABS: 50 mg | ORAL | Qty: 1 | Fill #0

## 2024-05-16 MED FILL — VANCOMYCIN HCL 10 G IV SOLR: 10 g | INTRAVENOUS | Qty: 1750 | Fill #0

## 2024-05-16 MED FILL — TAMSULOSIN HCL 0.4 MG PO CAPS: 0.4 mg | ORAL | Qty: 2 | Fill #0

## 2024-05-16 MED FILL — ATORVASTATIN CALCIUM 10 MG PO TABS: 10 mg | ORAL | Qty: 1 | Fill #0

## 2024-05-16 MED FILL — DISULFIRAM 250 MG PO TABS: 250 mg | ORAL | Qty: 1 | Fill #0

## 2024-05-16 MED FILL — DONEPEZIL HCL 10 MG PO TABS: 10 mg | ORAL | Qty: 1 | Fill #0

## 2024-05-16 MED FILL — CEFTRIAXONE SODIUM 2 G IJ SOLR: 2 g | INTRAMUSCULAR | Qty: 2000 | Fill #0

## 2024-05-16 MED FILL — ASPIRIN 325 MG PO TABS: 325 mg | ORAL | Qty: 1 | Fill #0

## 2024-05-16 MED FILL — LISINOPRIL 10 MG PO TABS: 10 mg | ORAL | Qty: 1 | Fill #0

## 2024-05-16 NOTE — Care Coordination (Signed)
"  Pt is from [facility] and [address].  He will be able to return but will need a precert.  Electronically signed by Recardo Sorrel, RN on 05/16/2024 at 8:25 AM    "

## 2024-05-16 NOTE — Care Coordination (Signed)
 "Case Management Assessment  Initial Evaluation    Date/Time of Evaluation: 05/16/2024 8:36 AM  Assessment Completed by: Recardo Sorrel, RN    If patient is discharged prior to next notation, then this note serves as note for discharge by case management.    Patient Name: Raymond Skinner                   Date of Birth: [birth year]  Diagnosis: UTI (urinary tract infection) [N39.0]  Disorientation [R41.0]  Multiple falls [R29.6]  Urinary tract infection associated with catheterization of urinary tract, unspecified indwelling urinary catheter type, initial encounter [T83.511A, N39.0]  Hematuria, unspecified type [R31.9]                   Date / Time: 05/14/2024  2:27 PM    Patient Admission Status: Inpatient   Readmission Risk (Low < 19, Mod (19-27), High > 27): Readmission Risk Score: 19.3    Current PCP: Joshua Dorothyann SAILOR, APRN  PCP verified by CM? (P) Yes    Chart Reviewed: Yes      History Provided by: Other (see comment), Medical Record (facility)  Patient Orientation: Other (see comment) (pt is very confused and disoriented)    Patient Cognition: Dementia / Early Alzheimer's    Hospitalization in the last 30 days (Readmission):  Yes    If yes, Readmission Assessment in CM Navigator will be completed.    Advance Directives:      Code Status: Full Code   Patient's Primary Decision Maker is: Legal Next of Kin    Primary Decision Maker: Council,[name] - Other Relative - [phone]    Primary Decision Maker: Aubert,James - Brother/Sister [phone]    Discharge Planning:    Patient lives with: (P) Other (Comment) (lives at nursing facility) Type of Home: (P) Skilled Nursing Facility  Primary Care Giver: Other (Comment), Family [facility])  Patient Support Systems include: Family Members, Other (Comment) (nursing facility)   Current community resources:    Current services prior to admission: (P) None            Current DME:              Type of Home Care services:       ADLS  Prior functional level: (P)  Assistance with the following:, Bathing, Toileting, Dressing, Mobility  Current functional level: (P) Unable to Assess    PT AM-PAC:   /24  OT AM-PAC:   /24    Family can provide assistance at DC: (P) No  Would you like Case Management to discuss the discharge plan with any other family members/significant others, and if so, who?    Plans to Return to Present Housing: (P) Yes  Other Identified Issues/Barriers to RETURNING to current housing: medical stability  Potential Assistance needed at discharge: (P) N/A            Potential DME:    Patient expects to discharge to: (P) Skilled nursing facility  Plan for transportation at discharge: (P) Family    Financial    Payor: UHC MEDICARE / Plan: DREMA DUAL COMPLETE / Product Type: *No Product type* /     Does insurance require precert for SNF: Yes    Potential assistance Obtaining Medications: (P) No  Meds-to-Beds request:        [facility] - [place], [place] - [address] - [place] [phone] - F [phone]  [address]  [place] [address]  Phone: [phone] Fax:  [phone]    [facility] - [place]WO[place] Guardian, [place] - [address] - [place] [phone] GLENWOOD FAL[place] [phone]  [address]  [place] [address]  Phone: [phone] Fax: [phone]      Notes:    Factors facilitating achievement of predicted outcomes: Family support, Caregiver support, and Has needed Durable Medical Equipment at home    Barriers to discharge: Medical complications    Additional Case Management Notes: pt is a resident at [facility] and rehab.  He will be able to return at time of discharge but will need a precert.  Will follow for needs.    The Plan for Transition of Care is related to the following treatment goals of UTI (urinary tract infection) [N39.0]  Disorientation [R41.0]  Multiple falls [R29.6]  Urinary tract infection associated with catheterization of urinary tract, unspecified indwelling urinary catheter type, initial encounter [T83.511A,  N39.0]  Hematuria, unspecified type [R31.9]    IF APPLICABLE: The Patient and/or patient representative Raymond Skinner and his family were provided with a choice of provider and agrees with the discharge plan. Freedom of choice list with basic dialogue that supports the patient's individualized plan of care/goals and shares the quality data associated with the providers was provided to:     Patient Representative Name:       The Patient and/or Patient Representative Agree with the Discharge Plan?      Recardo Sorrel, RN  Case Management Department                 05/16/24 [phone]   Service Assessment   Information Provided By Other (see comment);Medical Record  (facility)   Patient Orientation Other (see comment)  (pt is very confused and disoriented)   Cognition Dementia / Early Alzheimer's   Primary Caregiver Other (Comment);Family  [facility])   Support Systems Family Members;Other (Comment)  (nursing facility)   Patient's Healthcare Decision Maker is: Legal Next of Kin   PCP Verified by CM Yes   Last Visit to PCP Within last 3 months   Prior Functional Level Assistance with the following:;Bathing;Toileting;Dressing;Mobility   History of falls? 1   Current Functional Level at Time of Initial Assessment Unable to Assess   Can patient return to prior living arrangement Yes   Ability to make needs known: Poor   Family able to assist with home care needs: No   Other than yourself, who should be included in your transition plan for discharge from the hospital? family   Receives Help From Other (Comment)  (former sister in social worker and nursing facility)   Social/Functional History   Prior Level of Assist for ADLs Needs assistance   Bathing Modified independent   Dressing Modified independent   Grooming Modified independent    Feeding Independent   Toileting Needs assistance   Prior Level of Assist for Homemaking Needs assistance  (living at a nursing facility)   Homemaking Responsibilities No   Prior Level of Assist for Transfers  Needs assistance   Ambulation Assistance Needs assistance   Active Driver No   Patient's Driver Info family or nursing facility   Occupation Retired   Surveyor, Minerals Prior to Acute Admission Skilled Nursing Facility   Lives With Other (Comment)  (lives at nursing facility)   Current Services Prior To Admission None   Potential Assistance Needed N/A   Potential Assistance Obtaining Medications No   Patient expects to be discharged to: Skilled nursing facility   Home  Layout One level   Home Access Level entry   Entrance Stairs - Rails None   Psychologist, Sport And Exercise accessible   Services At/After Discharge   [employer] Information Provided? No   Who will provide transportation at discharge? Family       "

## 2024-05-16 NOTE — Progress Notes (Signed)
 "   Hospitalists      Patient:  Raymond Skinner  Date of Birth: [birth year]  Date of Service: 05/16/2024  MRN: [id number]   Acct: [id number]   Primary Care Physician: Joshua Dorothyann SAILOR, APRN  Advance Directive: Full Code  Admit Date: 05/14/2024       Hospital Day: 2  Portions of this note have been copied forward, however, changed to reflect the most current clinical status of this patient.  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  Patient was recently hospitalized on 12/4 due to rhabdomyolysis this improved with IVF, did have urinary retention and indwelling Foley catheter was placed.  Patient was discharged 12/8 to [facility] facility.   Patient presented to [facility] ER due to altered mental status.  Patient noted to have UTI and had been placed on oral antibiotics at SNF facility.  Without improvement SNF sent patient to [facility] ER for further evaluation.  Workup in ER CT head no acute intracranial abnormality, CT right hip no acute fracture, XR left foot mild soft tissue swelling without acute fracture, urinalysis small LE, WBC 41.  Patient initiated on Rocephin  and was admitted to hospitalist service.  Patient remains altered unable to state name, date of birth.  Urine culture moderate growth Enterococcus faecalis initiated on IV vancomycin.  Per POA present at bedside patient is normally able to state name and is able to follow simple commands at baseline.    Review of Systems:   Review of Systems   Unable to perform ROS: Mental status change         Objective:   VITALS:  BP 138/80   Pulse 84   Temp 98.2 F (36.8 C) (Oral)   Resp 18   Ht 1.829 m (6' 0.01)   Wt 71.1 kg (156 lb 12 oz)   SpO2 99%   BMI 21.25 kg/m   24HR INTAKE/OUTPUT:    Intake/Output  Summary (Last 24 hours) at 05/16/2024 1533  Last data filed at 05/16/2024 1006  Gross per 24 hour   Intake --   Output 1550 ml   Net -1550 ml       Physical Exam  Vitals and nursing note reviewed.   Constitutional:       Appearance: Normal appearance.   HENT:      Mouth/Throat:      Mouth: Mucous membranes are moist.      Pharynx: Oropharynx is clear.   Eyes:      Extraocular Movements: Extraocular movements intact.      Conjunctiva/sclera: Conjunctivae normal.      Pupils: Pupils are equal, round, and reactive to light.   Cardiovascular:      Rate and Rhythm: Normal rate and regular rhythm.      Pulses: Normal pulses.      Heart sounds: Normal heart sounds. No murmur heard.  Pulmonary:      Effort: Pulmonary effort is normal. No respiratory distress.      Breath sounds: Normal breath sounds.   Abdominal:      General: Bowel sounds are normal. There is no distension.      Palpations: Abdomen is soft.      Tenderness: There is no abdominal tenderness. There is no guarding or  rebound.   Musculoskeletal:         General: No swelling. Normal range of motion.      Cervical back: Normal range of motion and neck supple. No rigidity or tenderness.      Right lower leg: No edema.      Left lower leg: No edema.   Skin:     General: Skin is warm and dry.      Capillary Refill: Capillary refill takes less than 2 seconds.      Comments: Pressure injury to bilateral ankles, see media   Neurological:      Mental Status: He is alert.      Cranial Nerves: No cranial nerve deficit, dysarthria or facial asymmetry.      Motor: Weakness (generalized) present.      Comments: Disoriented to person, place, time, and situation   Unable to follow commands   Pleasantly confused    Psychiatric:         Mood and Affect: Mood normal.         Behavior: Behavior normal.         Medications:      sodium chloride       sodium chloride  75 mL/hr at 05/15/24 1326      polyethylene glycol  17 g Oral Daily    sennosides-docusate sodium   2 tablet Oral BID     lisinopril  10 mg Oral Daily    vancomycin (VANCOCIN) intermittent dosing (placeholder)   Other RX Placeholder    [START ON 05/17/2024] vancomycin  1,250 mg IntraVENous Q12H    cefTRIAXone  (ROCEPHIN ) IV  2,000 mg IntraVENous Q24H    aspirin   325 mg Oral Daily    disulfiram   250 mg Oral BID    donepezil   10 mg Oral Nightly    [Held by provider] memantine   10 mg Oral BID    pantoprazole   20 mg Oral QAM AC    QUEtiapine   25 mg Oral Nightly    atorvastatin   10 mg Oral Daily    tamsulosin   0.8 mg Oral Daily    sodium chloride  flush  5-40 mL IntraVENous 2 times per day    enoxaparin   40 mg SubCUTAneous Daily     hydrALAZINE , sodium chloride  flush, sodium chloride , ondansetron  **OR** ondansetron , acetaminophen  **OR** acetaminophen , polyethylene glycol  ADULT DIET; Easy to Chew  ADULT ORAL NUTRITION SUPPLEMENT; Lunch, Dinner; Wound Healing Oral Supplement     Lab and other Data:     Recent Labs     05/14/24  1430 05/15/24  0434 05/16/24  0400   WBC 11.6* 8.3 6.3   HGB 13.9* 13.5* 14.4   PLT 327 303 282     Recent Labs     05/14/24  1430 05/15/24  0434 05/16/24  0400   NA 142 140 139   K 4.4 4.0 4.0   CL 103 102 102   CO2 27 24 20*   BUN 13 13 12    CREATININE 0.8 0.7 0.6*   GLUCOSE 145* 129* 111*     Recent Labs     05/14/24  1430   AST 23   ALT 24   BILITOT 0.4   ALKPHOS 130*     Troponin T: No results for input(s): TROPONINI in the last 72 hours.  Pro-BNP: No results for input(s): BNP in the last 72 hours.  INR: No results for input(s): INR in the last 72 hours.  UA:  Recent Labs     05/14/24  1430 05/14/24  1837   COLORU Yellow Yellow   PHUR 6.5 6.5   WBCUA TNTC* 41*   RBCUA TNTC* 641*   BACTERIA 4+* Negative   CLARITYU TURBID* CLOUDY*   LEUKOCYTESUR LARGE* SMALL*   UROBILINOGEN 1.0 1.0   BILIRUBINUR Negative Negative   BLOODU LARGE* LARGE*   GLUCOSEU Negative Negative   AMORPHOUS 2+*  --      A1C: No results for input(s): LABA1C in the last 72 hours.  ABG:No results for input(s): PHART, PCO2ART, PO2ART,  HCO3ART, BEART, HGBAE, O2SATART, CARBOXHGBART in the last 72 hours.    RAD:   XR FOOT LEFT (MIN 3 VIEWS)  Result Date: 05/16/2024   1.  Mild soft tissue swelling without visible acute fracture. 2. Chronic findings as above.    ______________________________________ Electronically signed by: NORLEEN KURK M.D. Date:     05/16/2024 Time:    06:58     CT HIP RIGHT WO CONTRAST  Result Date: 05/14/2024   1. No acute fracture.   All CT scans are performed using dose optimization techniques as appropriate to the performed exam and includes at least one of the following:  Automated exposure control, adjustment of the mA and/or kV according to size, and the use of iterative reconstruction technique.  All CT scans are performed using dose optimization techniques as appropriate to the performed exam and include at least one of the following: Automated exposure control, adjustment of the mA and/or kV according to size, and the use of iterative reconstruction technique.  ______________________________________ Electronically signed by: GUSTAVO HIGHTOWER M.D. Date:     05/14/2024 Time:    17:50     XR HIP 2-3 VW W PELVIS RIGHT  Result Date: 05/14/2024   Hip arthroplasty on the right  Degenerative changes of the spine and pelvis.  No displaced fracture evident however there is a subtle degree of lucency in the medial border of the greater trochanter which is a change from prior.  If there is concern for occult fracture recommend CT.    ______________________________________ Electronically signed by: GUSTAVO HIGHTOWER M.D. Date:     05/14/2024 Time:    16:52     CT HEAD WO CONTRAST  Result Date: 05/14/2024  1.  No acute intracranial process. 2. 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  All CT scans are performed using dose optimization techniques as appropriate to the performed exam and include at least one of the following: Automated exposure control, adjustment of the mA and/or kV according to  size, and the use of iterative reconstruction technique.  ______________________________________ Electronically signed by: GLENDIA BIRK M.D. Date:     05/14/2024 Time:    15:39     Assessment/Plan   Principal Problem:    UTI (urinary tract infection)   -Urinalysis & Culture    -IV antibiotic, Rocephin  & Vancomycin    -Monitor I&O  Active Problems    Dementia    Aricept , seroquel    Held namenda     Fall precautions   PT/OT/SLP    Alcohol abuse   Disulfiram    Hypertension    Currently 190/110   Lisinopril initiated   As needed anti-hypertensive      Hyperlipidemia   Statin therapy     Urinary retention   Foley catheter exchanged    Flomax     Monitor I&O    Moderate malnutrition   Dieitican consulted   Constipation    Scheduled bowel regimen     DVT Prophylaxis: lovenox   Leontine CHRISTELLA Pinal, APRN - CNP, 05/16/2024 3:33 PM  "

## 2024-05-16 NOTE — Progress Notes (Addendum)
"  [facility]   Pharmacy Pharmacokinetic Monitoring Service - Vancomycin      Raymond Skinner is a 68 y.o. male starting on vancomycin  therapy for UTI. Pharmacy consulted by Leontine Pinal, APRN for monitoring and adjustment.    Target Concentration: Goal AUC/MIC 400-600 mg*hr/L    Additional Antimicrobials: Ceftriaxone     Pertinent Laboratory Values:   Wt Readings from Last 1 Encounters:   05/14/24 71.1 kg (156 lb 12 oz)     Temp Readings from Last 1 Encounters:   05/17/24 98.4 F (36.9 C)     Estimated Creatinine Clearance: 90 mL/min (based on SCr of 0.8 mg/dL).  Recent Labs     05/16/24  0400 05/17/24  0245   CREATININE 0.6* 0.8   BUN 12 11   WBC 6.3 6.4     Procalcitonin:     Pertinent Cultures:  Culture Date Source Results   05/14/24 Urine Enterococcous faecalis  Corynebacterium striatum    MRSA Nasal Swab: N/A. Non-respiratory infection.    Plan:  Dosing recommendations based on Bayesian software  Start vancomycin  1750 mg x 1 loading dose, followed by 1250 mg every 12 hours  Anticipated AUC of 481 and trough concentration of 13.3 at steady state  Renal labs as indicated  Vancomycin  concentration ordered for 05/17/24 @ 0100   Pharmacy will continue to monitor patient and adjust therapy as indicated    Thank you for the consult,  Aroldo Galli, [facility]  05/17/2024 10:20 AM    "

## 2024-05-16 NOTE — Progress Notes (Signed)
 " Occupational Therapy Initial Assessment  Date: 05/16/2024   Patient Name: Raymond Skinner  MRN: [id number]     DOB: 06-22-1955    Date of Service: 05/16/2024    Discharge Recommendations:  Patient would benefit from continued therapy after discharge       Assessment   Assessment: Evaluation completed and tx initiated.  The patient would benefit from further skilled therapy to upgrade safety and functional independence. Currently significantly impaired by decreased cognition to comprehend basic self care or mobility tasks.  Treatment Diagnosis: UTI, AMS    REQUIRES OT FOLLOW-UP: Yes  Activity Tolerance  Activity Tolerance: Treatment limited secondary to decreased cognition           Patient Diagnosis(es): The primary encounter diagnosis was Disorientation. Diagnoses of Urinary tract infection associated with catheterization of urinary tract, unspecified indwelling urinary catheter type, initial encounter, Hematuria, unspecified type, and Multiple falls were also pertinent to this visit.   has a past medical history of Dementia (HCC) and Epstein Barr infection.   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    Treatment Diagnosis: UTI, AMS      Restrictions  Restrictions/Precautions  Restrictions/Precautions: Fall Risk  Activity Level: Up with Assist  Required Braces or Orthoses?: No  Position Activity Restriction  Other Position/Activity Restrictions: Right Anterior Total Hip 12/2023    Subjective   General  Patient assessed for rehabilitation services?: Yes  Vital Signs  BP: (!) 190/110 (manual)  MAP (Calculated): 137     05/16/24 1056   Pain   Post-Pain   (winces at times during treatment but can not localize or rate)   Pain Interventions   (modified handling strategies, slow and gentle mobilization)     Social/Functional History  Social/Functional History  Lives With: Other (Comment) (lives at nursing facility)  Type of Home: Facility [facility])  Home Layout: One level  Home  Access: Level entry  Entrance Stairs - Rails: None  Nurse, Learning Disability: Wheelchair accessible  [employer] Help From: Other (Comment) (former sister in social worker and nursing facility)  Prior Level of Assist for ADLs: Needs assistance  Bathing: Modified independent  Dressing: Modified independent  Grooming: Modified independent   Feeding: Independent  Toileting: Needs assistance  Prior Level of Assist for Homemaking: Needs assistance (living at a nursing facility)  Homemaking Responsibilities: No  Prior Level of Assist for Transfers: Needs assistance  Active Driver: No  Patient's Driver Info: family or nursing facility  Occupation: Retired  Additional Comments: pt unable to report prior level of function       Objective           Observation/Palpation  Posture: Fair  Observation: IV, foley  Ecologist: Unable to assess  Toilet Transfers Comments: unable to facilitate standing    ADL  Feeding: Supervision;Maximum assistance  Grooming: Supervision;Maximum assistance  UE Bathing: Maximum assistance  LE Bathing: Maximum assistance  UE Dressing: Maximum assistance  LE Dressing: Maximum assistance  Toileting: Maximum assistance          Bed mobility  Supine to Sit: Substantial/Maximal assistance  Sit to Supine: Substantial/Maximal assistance  Bed Mobility Comments: required scooting up in bed x2 due to pt sliding down. Sat on EOB x 5 mins SBA.    Transfers  Transfer Comments: Sat EOB with supervision to min A.  Attempted to stand but was not able to comprehend the task         Cognition  Overall Cognitive  Status: Exceptions  Arousal/Alertness: Appears intact  Following Commands: Impaired;Follows one step commands with increased time;Follows one step commands with repetition  Attention Span: Impaired;Attends with cues to redirect  Memory: Impaired;Decreased recall of recent events;Decreased recall of precautions  Safety Judgement: Impaired;Decreased awareness of need for safety;Decreased awareness of need  for assistance  Problem Solving: Impaired;Assistance required to generate solutions;Decreased awareness of errors;Assistance required to implement solutions  Insights: Decreased awareness of deficits  Initiation: Requires cues for all  Sequencing: Requires cues for all                     LUE AROM (degrees)  LUE AROM : WFL  RUE AROM (degrees)  RUE AROM : WFL                          Plan   Occupational Therapy Plan  Times Per Week: 3-5    Goals  Short Term Goals  Short Term Goal 1: The patient will demo sufficient cognition to meaningfully participate in any simple self care tasks consistently  Short Term Goal 2: The patient will demo sufficient cognition to stand/transfer with assist as needed       Tx inititiated: Emphasis of tx was reorientation and following simple commands using different strategies to improve comprehension; minimal success facilitating meaningful responses. (15 mins)             Electronically signed by Ronal JINNY Raw, OT on 05/16/2024 at 12:56 PM  "

## 2024-05-16 NOTE — Progress Notes (Signed)
 "Physical Therapy  Facility/Department: [facility]  Physical Therapy Initial Assessment    Name: Raymond Skinner  DOB: 1955/11/11  MRN: [id number]  Date of Service: 05/16/2024    Discharge Recommendations:  Continue to assess pending progress, 24 hour supervision or assist, Patient would benefit from continued therapy after discharge          Patient Diagnosis(es): The primary encounter diagnosis was Disorientation. Diagnoses of Urinary tract infection associated with catheterization of urinary tract, unspecified indwelling urinary catheter type, initial encounter, Hematuria, unspecified type, and Multiple falls were also pertinent to this visit.  Past Medical History:  has a past medical history of Dementia (HCC) and Epstein Barr infection.  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    Assessment  Body Structures, Functions, Activity Limitations Requiring Skilled Therapeutic Intervention: Decreased functional mobility ;Decreased ADL status;Decreased ROM;Decreased body mechanics;Decreased cognition;Decreased safe awareness;Decreased strength;Decreased balance;Decreased coordination;Decreased posture;Increased pain  Assessment: Pt. will benefit from cont. PT to decrease impairments. Pt a fall risk and should not attempt mobility on his own. Pt. needs 24 hr care. Anticipate return to SNF.  Treatment Diagnosis: impaired gait and mobility  Therapy Prognosis: Fair  Decision Making: Medium Complexity  Barriers to Learning: cognition  Requires PT Follow-Up: Yes  Activity Tolerance  Activity Tolerance: Patient limited by fatigue;Treatment limited secondary to decreased cognition    Plan  Physical Therapy Plan  General Plan: 5-7 times per week  Therapy Duration: 2 Weeks  Current Treatment Recommendations: Strengthening, ROM, Functional mobility training, Balance training, Transfer training, Gait training, Endurance training, Equipment evaluation,  education, sports administrator, Patient/Caregiver education & training, Therapeutic activities, Safety education & training, Positioning  Safety Devices  Type of Devices: Gait belt, Left in bed, Call light within reach, Bed alarm in place, Patient at risk for falls, Heels elevated for pressure relief, Nurse notified (APRN, Logan, present and taking manual BP.)    Restrictions  Restrictions/Precautions  Restrictions/Precautions: Fall Risk  Activity Level: Up with Assist  Required Braces or Orthoses?: No     Subjective  General  Chart Reviewed: Yes  Patient assessed for rehabilitation services?: Yes  Additional Pertinent Hx: R hip fx, THA 01/14/24; recent hospital admit  Response To Previous Treatment: Not applicable  Family/Caregiver Present: No  Referring Practitioner: Tobie Sallee NOVAK, APRN - CNP  Referral Date : 05/15/24  Diagnosis: disorientation, UTI, hematuria, falls  Follows Commands: Impaired  Other (Comment): limited ability to follow commands  General  General Comments: RN, Therisa perks PT.  Subjective  Subjective: Pt. speaking, but non-sensical.         Social/Functional History  Social/Functional History  Lives With: Other (Comment) (lives at nursing facility)  Type of Home: Facility [facility])  Home Layout: One level  Home Access: Level entry  Entrance Stairs - Rails: None  Nurse, Learning Disability: Wheelchair accessible  [employer] Help From: Other (Comment) (former sister in social worker and nursing facility)  Prior Level of Assist for ADLs: Needs assistance  Bathing: Modified independent  Dressing: Modified independent  Grooming: Modified independent   Feeding: Independent  Toileting: Needs assistance  Prior Level of Assist for Homemaking: Needs assistance (living at a nursing facility)  Homemaking Responsibilities: No  Prior Level of Assist for Transfers: Needs assistance  Active Driver: No  Patient's Driver Info: family or nursing facility  Occupation: Retired  Additional Comments: pt unable to report prior level  of function  Vision/Hearing  Hearing  Hearing: Within Functional Limits  Cognition   Orientation  Overall Orientation Status: Impaired  Orientation Level: Oriented to person (1st name only)  Cognition  Overall Cognitive Status: Exceptions  Arousal/Alertness: Appears intact  Following Commands: Impaired;Follows one step commands with increased time;Follows one step commands with repetition  Attention Span: Impaired;Attends with cues to redirect  Memory: Impaired;Decreased recall of recent events;Decreased recall of precautions  Safety Judgement: Impaired;Decreased awareness of need for safety;Decreased awareness of need for assistance  Problem Solving: Impaired;Assistance required to generate solutions;Decreased awareness of errors;Assistance required to implement solutions  Insights: Decreased awareness of deficits  Initiation: Requires cues for all  Sequencing: Requires cues for all    Objective  Temp: 98.2 F (36.8 C)  Pulse: 84  Heart Rate Source: Monitor  Respirations: 18  SpO2: 99 %  O2 Device: None (Room air)  BP: (!) 190/110 (manual)  MAP (Calculated): 137  BP Location: Right upper arm  BP Method: Automatic  Patient Position: Lying right side     Observation/Palpation  Posture: Fair  Observation: IV, foley     Pain  Pre-Pain:  (unable to rate, does wince at times)    AROM RLE (degrees)  RLE AROM: WFL  RLE General AROM: knee and hip 0-90 AAROM, ankle WFLS  AROM LLE (degrees)  LLE AROM : Exceptions  LLE General AROM: L ankle to neutral DF AAROM, knee and hip 0-90 AAROM  Strength RLE  Strength RLE: Exception  Comment: 3-/5 knee, ankle and hip  Strength LLE  Strength LLE: Exception  Comment: 3-/5 knee and hip, ankle 2-/5           Bed mobility  Supine to Sit: Substantial/Maximal assistance  Sit to Supine: Substantial/Maximal assistance  Scooting: Substantial/Maximal assistance  Bed Mobility Comments: required scooting up in bed x2 due to pt sliding down. Sat on EOB x 5 mins SBA.  Transfers  Sit to Stand:  Unable to assess  Stand to Sit: Unable to assess  Comment: pt does not initate proper movements for sit to stand technique.  Ambulation  Comments: did not attempt due to poor cognition     Balance  Posture: Fair  Sitting - Static: Good;-  Sitting - Dynamic: Fair;+          OutComes Score                                                  AM-PAC - Mobility              Tinneti Score       Goals  Short Term Goals  Time Frame for Short Term Goals: 2 wks  Short Term Goal 1: supine to sit Min A  Short Term Goal 2: sit to stand Min A  Short Term Goal 3: bed to chair Min A  Short Term Goal 4: amb. 56' with RW CGA  Patient Goals   Patient Goals : not stated       Education  Patient Education  Education Given To: Patient  Education Provided: Role of Therapy;Plan of Care;Mobility Training;Transfer Training;Family Education;Fall Prevention Strategies  Education Provided Comments: use of call light, staff A  Education Method: Demonstration;Verbal  Barriers to Learning: Cognition  Education Outcome: Continued education needed;Unable to verbalize;Unable to demonstrate understanding      Therapy Time   Individual Concurrent Group Co-treatment   Time In  Time Out           Minutes                   Tawni JONELLE Husband, PT       Electronically signed by Tawni JONELLE Husband, PT on 05/16/2024 at 12:42 PM    "

## 2024-05-16 NOTE — Progress Notes (Signed)
 "SLP Therapy  Facility/Department: [facility]   CLINICAL BEDSIDE SWALLOW EVALUATION    NAME: Raymond Skinner  DOB: [birth year]  MRN: [id number]    ADMISSION DATE: 05/14/2024  ADMITTING DIAGNOSIS: has Recurrent falls; Dementia (HCC); Closed fracture of right femur, initial encounter (HCC); Delirium superimposed on dementia; Closed subcapital fracture of neck of right femur (HCC); Rhabdomyolysis; Alcohol abuse; Cannabis abuse; Urinary tract infection; UTI (urinary tract infection); Hyperlipidemia; and Urinary retention on their problem list.  ONSET DATE: 05/14/2024    Date of Eval: 05/16/2024    Evaluating Therapist: Bartley Jenkins Coombe, SLP    Recommended Diet and Intervention  Solid Consistency Recommendation: Easy to Chew  Liquid Consistency Recommendation: Thin  Recommended Form of Meds: Whole with water   Recommendations: Total feed  Planned Dysphagia Interventions: Diet tolerance monitoring;Patient/Family education    Compensatory Swallowing Strategies  Alternate solids and liquids;Total feed;Small bites/sips;Upright as possible for all oral intake        Reason for Referral  Raymond Skinner was referred for a bedside swallow evaluation to assess the efficiency of his swallow function, identify signs and symptoms of aspiration and make recommendations regarding safe dietary consistencies, effective compensatory strategies, and safe eating environment.    Primary Complaint  Pt verbalized no complaints.    Current Diet level:  Current Diet : NPO  Current Liquid Diet : NPO          Impression  Dysphagia Diagnosis: Mild oral stage dysphagia;Mild pharyngeal stage dysphagia  Dysphagia Impression : Pt demo mild oral and pharyngeal stage dysphagia. Pt demo throat clearing X 1 with initial sip of thin, via cup. Pt did not demo coughing with any PO trial. Pt demo good laryngeal elevation with all PO trials.  Dysphagia Outcome Severity Scale: Level 5: Mild dysphagia- Distant supervision. May need one diet consistency  restricted     Treatment Plan  Requires SLP Intervention: Yes  Speech Therapy Plan  Planned Dysphagia Interventions: Diet tolerance monitoring;Patient/Family education  D/C Recommendations: Ongoing speech therapy is recommended during this hospitalization       Treatment Goals       Short Term Goals  Goal 1: Pt will safely tolerate easy to chew diet with thin liquids with minimal outward S/S of aspiration/penetration during all PO intake.  Goal 2: Pt/family/staff will follow swallow recommendations to ensure safety during all PO intake.         Patient/family involved in developing goals and treatment plan: yes      General  Comments: Pt was resting comfortably in his bed. There was no family/caregiver present.  Subjective  Subjective: Pt stated his first and last names but was unable to answer any other biographical questions. Pt demo difficulty following simple commands.  Behavior/Cognition: Cooperative;Pleasant mood;Requires cueing;Confused  Temperature Spikes Noted: No  Respiratory Status: Room air  O2 Device: None (Room air)  Communication Observation: Functional (Pt's speech is 100% intelligible, but he does have difficulty answering questions and following simple commands.)  Follows Directions: Simple (verbal/visual cuing is required)  Baseline Vocal Quality: Normal  Volitional Cough: Strong  Prior Dysphagia History: Per notes in chart, pt is on a regular diet with thin liquids at the nursing home.    Social Functional  Social/Functional History  Lives With: Other (Comment) (lives at nursing facility)  Home Layout: One level  Home Access: Level entry  Entrance Stairs - Rails: None  Bathroom Accessibility: Wheelchair accessible  [employer] Help From: Other (Comment) (former sister in social worker and nursing facility)  Prior Level of Assist for ADLs: Needs assistance  Bathing: Modified independent  Dressing: Modified independent  Grooming: Modified independent   Feeding: Independent  Toileting: Needs assistance  Prior  Level of Assist for Homemaking: Needs assistance (living at a nursing facility)  Homemaking Responsibilities: No  Prior Level of Assist for Transfers: Needs assistance  Active Driver: No  Patient's Driver Info: family or nursing facility  Occupation: Retired    Vision/Hearing  Vision: Within Systems Developer  Hearing: Within Electrical Engineer  Oral Motor: Impaired  Oral Hygiene: Moist;Clean  Dentition: Upper dentures;Partials (comment) (Pt has lower partial with some missing teeth.)  Baseline Vocal Quality: Normal  Volitional Cough: Strong  Labial Symmetry: Abnormal symmetry left  Gag: No Impairment    Oral/ Pharyngeal Phase    Oral Phase: Impaired  PO Trials  Neuromuscular Estim Used: No  Assessment Method(s): Observation;Palpation  Patient Position: Pt was upright in bed.  Consistency Presented: Ice Chips;Thin;Pureed;Soft & Bite Sized;Regular  How Presented: SLP-fed/Presented;Straw;Spoon;Cup/sip;Self-fed/presented  Bolus Acceptance: No impairment (Pt required cuing to open his mouth when presented with cup and spoon.)  Bolus Formation/Control: Impaired  Type of Impairment: Mastication;Lip closure  Propulsion: No impairment  Oral Residue: Less than 10% of bolus;Lingual (Pt cleared with liquid wash.)  Initiation of Swallow: No impairment  Laryngeal Elevation: Functional  Aspiration Signs/Symptoms: Throat clear (Pt demo throat clearing times 1 with thin liquids, via cup.)  Pharyngeal Phase Characteristics: Suspected pharyngeal residue  Effective Modifications: Alternate liquids/solids;Straw (Pt tolerated thin liquids better with straw vs cup.)  Cues for Modifications: Moderate  Oral Phase  Pt required cuing to open his mouth to accept straw, cup and spoon. Pt demo slightly decreased mastication with more solid consistency. Pt demo minimal lingual residue with more solid consistency. Pt cleared with liquid wash.    Indicators of Pharyngeal Dysfunction  Pt demo throat clearing X 1 with initial sip of  thin, via cup. Pt did not demo coughing with any PO trial. Pt demo good laryngeal elevation with all PO trials.    Prognosis  Consulted and agree with results and recommendations: Patient;RN  RN Name: Therisa    Education  Education Topic: Results/ recommendations of diagnostic testing  Education Method: Verbal  Patient Education Response: No evidence of learning       Therapy Time     Individual Concurrent Group Co-treatment   Time In           Time Out           Minutes                   Bartley Jenkins Coombe, SLP  05/16/2024 9:02 AM    "

## 2024-05-16 NOTE — Plan of Care (Signed)
"    Problem: Safety - Adult  Goal: Free from fall injury  05/16/2024 0922 by Charlann Kung, RN  Outcome: Progressing  05/15/2024 2119 by Sharl Herring, LPN  Outcome: Progressing  Flowsheets (Taken 05/15/2024 2118)  Free From Fall Injury: Instruct family/caregiver on patient safety     Problem: Cardiovascular - Adult  Goal: Maintains optimal cardiac output and hemodynamic stability  05/16/2024 0922 by Charlann Kung, RN  Outcome: Progressing  05/15/2024 2119 by Sharl Herring, LPN  Outcome: Progressing  Goal: Absence of cardiac dysrhythmias or at baseline  05/16/2024 0922 by Charlann Kung, RN  Outcome: Progressing  05/15/2024 2119 by Sharl Herring, LPN  Outcome: Progressing     Problem: Skin/Tissue Integrity - Adult  Goal: Skin integrity remains intact  Description: 1.  Monitor for areas of redness and/or skin breakdown  2.  Assess vascular access sites hourly  3.  Every 4-6 hours minimum:  Change oxygen saturation probe site  4.  Every 4-6 hours:  If on nasal continuous positive airway pressure, respiratory therapy assess nares and determine need for appliance change or resting period  05/16/2024 9077 by Charlann Kung, RN  Outcome: Progressing  05/15/2024 2119 by Sharl Herring, LPN  Outcome: Progressing  Flowsheets (Taken 05/15/2024 2118)  Skin Integrity Remains Intact: Monitor for areas of redness and/or skin breakdown  Goal: Incisions, wounds, or drain sites healing without S/S of infection  05/16/2024 0922 by Charlann Kung, RN  Outcome: Progressing  05/15/2024 2119 by Sharl Herring, LPN  Outcome: Progressing     Problem: Gastrointestinal - Adult  Goal: Maintains or returns to baseline bowel function  05/16/2024 0922 by Charlann Kung, RN  Outcome: Progressing  05/15/2024 2119 by Sharl Herring, LPN  Outcome: Progressing     Problem: Genitourinary - Adult  Goal: Absence of urinary retention  05/16/2024 0922 by Charlann Kung, RN  Outcome: Progressing  05/15/2024 2119 by Sharl Herring, LPN  Outcome:  Progressing  Goal: Urinary catheter remains patent  05/16/2024 9077 by Charlann Kung, RN  Outcome: Progressing  05/15/2024 2119 by Sharl Herring, LPN  Outcome: Progressing     Problem: Metabolic/Fluid and Electrolytes - Adult  Goal: Electrolytes maintained within normal limits  05/16/2024 0922 by Charlann Kung, RN  Outcome: Progressing  05/15/2024 2119 by Sharl Herring, LPN  Outcome: Progressing  Goal: Glucose maintained within prescribed range  05/16/2024 0922 by Charlann Kung, RN  Outcome: Progressing  05/15/2024 2119 by Sharl Herring, LPN  Outcome: Progressing     Problem: Skin/Tissue Integrity  Goal: Skin integrity remains intact  Description: 1.  Monitor for areas of redness and/or skin breakdown  2.  Assess vascular access sites hourly  3.  Every 4-6 hours minimum:  Change oxygen saturation probe site  4.  Every 4-6 hours:  If on nasal continuous positive airway pressure, respiratory therapy assess nares and determine need for appliance change or resting period  05/16/2024 9077 by Charlann Kung, RN  Outcome: Progressing  05/15/2024 2119 by Sharl Herring, LPN  Outcome: Progressing  Flowsheets (Taken 05/15/2024 2118)  Skin Integrity Remains Intact: Monitor for areas of redness and/or skin breakdown     "

## 2024-05-16 NOTE — Progress Notes (Signed)
"  Comprehensive Nutrition Assessment    Type and Reason for Visit:  Initial, Positive nutrition screen    Nutrition Recommendations/Plan:   Add moderate malnutrition to problem list  Add Juven in pudding BID to lunch and dinner trays  Will cont to monitor PO intake of meals and ONS  Will cont to monitor weights     Malnutrition Assessment:  Malnutrition Status:  Moderate malnutrition (05/16/24 1413)    Context:  Acute Illness     Findings of the 6 clinical characteristics of malnutrition:  Energy Intake:  Mild decrease in energy intake  Weight Loss:  Greater than 7.5% over 3 months     Body Fat Loss:  Unable to assess     Muscle Mass Loss:  Unable to assess    Fluid Accumulation:  Mild Extremities  Grip Strength:  Not Performed    Nutrition Assessment:    Consult and +NS for wounds. Pt's diet has now been advanced to soft easy to chew diet. Unable to assess PO intake of meals. Will add Juven in pudding BID to lunch and dinner tray to enhance wound healing. Will cont to monitor PO intake of meals and ONS. Aware of major weight loss in 3 mo, 19%. This is significant. Will cont to monitor weights.    Nutrition Related Findings:    Many wounds noted. Na 139, K 4.0, BUN 12, Creat 0.6, Glu 111-145, POC 107-118. LLE trace edema. Wound Type: Multiple, Traumatic Wounds, Pressure Injury       Current Nutrition Intake & Therapies:    Average Meal Intake: Unable to assess  Average Supplements Intake: None Ordered  ADULT DIET; Easy to Chew    Anthropometric Measures:  Height: 182.9 cm (6' 0.01)  Ideal Body Weight (IBW): 178 lbs (81 kg)    Admission Body Weight: 71.1 kg (156 lb 12 oz)  Current Body Weight: 71.1 kg (156 lb 12 oz), 88.1 % IBW. Weight Source: Bed scale  Current BMI (kg/m2): 21.3  Usual Body Weight: 87.5 kg (192 lb 14.4 oz) (10/13)   % Weight Change (Calculated): -18.7  Weight Adjustment For: No Adjustment   BMI Categories: Underweight (BMI less than 22) age over 46    Estimated Daily Nutrient Needs:  Energy  Requirements Based On: Kcal/kg  Weight Used for Energy Requirements: Current  Energy (kcal/day): 8221-7866 kcal (25-30 kcal/kg)  Weight Used for Protein Requirements: Current  Protein (g/day): 71-142 g (1-2 g/kg)  Method Used for Fluid Requirements: 1 ml/kcal  Fluid (ml/day): 8221-7866 ml/kcal    Nutrition Diagnosis:   Inadequate oral intake related to increase demand for energy/nutrients as evidenced by wounds    Nutrition Interventions:   Food and/or Nutrient Delivery: Continue Current Diet, Start Oral Nutrition Supplement  Nutrition Education/Counseling: No recommendation at this time  Coordination of Nutrition Care: Continue to monitor while inpatient       Goals:  Goals: Meet at least 75% of estimated needs, PO intake 50% or greater, Maintain adequate nutrition status  Type of Goal: New goal  Previous Goal Met: New Goal    Nutrition Monitoring and Evaluation:   Behavioral-Environmental Outcomes: None Identified  Food/Nutrient Intake Outcomes: Food and Nutrient Intake, Supplement Intake  Physical Signs/Symptoms Outcomes: Biochemical Data, Fluid Status or Edema, Nutrition Focused Physical Findings, Skin, Weight    Discharge Planning:    Continue Oral Nutrition Supplement, Continue current diet     Alyssa Herndon  Contact: [phone]    "

## 2024-05-16 NOTE — Progress Notes (Addendum)
 "     [facility]  Nurse  Consult Note       NAME:  Raymond Skinner  MEDICAL RECORD NUMBER:  [id number]  AGE: 68 y.o.   GENDER: male  DOB: 08-Jun-1955  TODAY'S DATE:  05/16/2024    Subjective   Reason for Wound Nurse Evaluation and Assessment: bilateral ankle pressure wounds      Raymond Skinner is a 68 y.o. male referred by:   []  Physician  [x]  Nursing  []  Other:     Wound Identification:  Wound Type: pressure  Contributing Factors: chronic pressure and decreased mobility    Wound History: Wound care has been consulted for pressure injuries to the bilateral ankles. Currently the wounds are unstageable and covered with moist eschar. Peri wound skin has blanchable erythema. No s/s of infection noted at this time.       Current Wound Care Treatment:      BILATERAL ANKLES: Paint eschar covered wounds with betadine twice daily. Leave open to air. 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    HIP TOTAL ARTHROPLASTY performed by Dozier Gilmore DASEN, MD at [facility] OR       FAMILY HISTORY    Family History   Problem Relation Age of Onset    Diabetes Mother     Dementia Father     No Known Problems Maternal Uncle     Dementia Paternal Aunt     Dementia Maternal Grandmother     Dementia Maternal Grandfather     Dementia Paternal Grandmother     Dementia Paternal Grandfather        SOCIAL HISTORY    Social History     Tobacco Use    Smoking status: Former     Current packs/day: 0.00     Average packs/day: 0.5 packs/day for 42.4 years (21.2 ttl pk-yrs)     Types: Cigarettes     Start date: 12/31/1974     Quit date: 2019     Years since quitting: 6.9    Smokeless tobacco:  Never   Vaping Use    Vaping status: Never Used   Substance Use Topics    Alcohol use: Never    Drug use: Yes     Types: Marijuana (Weed)       ALLERGIES    No Known Allergies    MEDICATIONS    No current facility-administered medications on file prior to encounter.     Current Outpatient Medications on File Prior to Encounter   Medication Sig Dispense Refill    tamsulosin  (FLOMAX ) 0.4 MG capsule Take 2 capsules by mouth daily 30 capsule 3    Vitamin D -Vitamin K (D3 + K2 PO) Take by mouth      aspirin  (BAYER ASPIRIN ) 325 MG tablet Take 1 tablet by mouth daily 30 tablet 11    simvastatin  (ZOCOR ) 20 MG tablet Take 1 tablet by mouth nightly  30 tablet 11    donepezil  (ARICEPT ) 10 MG tablet Take 1 tablet by mouth nightly 30 tablet 11    disulfiram  (ANTABUSE ) 250 MG tablet Take 1 tablet by mouth in the morning and at bedtime 60 tablet 11    vitamin B-6 (PYRIDOXINE ) 100 MG tablet Take 1 tablet by mouth daily      cyanocobalamin  1000 MCG tablet Take 2.5 tablets by mouth daily      Ginkgo 60 MG TABS Take by mouth      QUEtiapine  (SEROQUEL ) 25 MG tablet Take 1 tablet by mouth nightly (Patient not taking: Reported on 05/15/2024) 60 tablet 3    pantoprazole  (PROTONIX ) 20 MG tablet Take 1 tablet by mouth every morning (before breakfast) 30 tablet 0    Melatonin 10 MG TABS Take 10 mg by mouth nightly      memantine  (NAMENDA ) 10 MG tablet Take 1 tablet by mouth 2 times daily 60 tablet 11    Multiple Vitamins-Minerals (THERAPEUTIC MULTIVITAMIN-MINERALS) tablet Take 1 tablet by mouth daily         Objective    BP (!) 173/90   Pulse 84   Temp 98.2 F (36.8 C) (Oral)   Resp 18   Ht 1.829 m (6')   Wt 71.1 kg (156 lb 12 oz)   SpO2 99%   BMI 21.26 kg/m     LABS:  WBC:    Lab Results   Component Value Date/Time    WBC 6.3 05/16/2024 04:00 AM     H/H:    Lab Results   Component Value Date/Time    HGB 14.4 05/16/2024 04:00 AM    HCT 42.9 05/16/2024 04:00 AM     PTT:    Lab Results   Component Value Date/Time    APTT 31.1 01/13/2024  11:56 PM   [APTT}  PT/INR:    Lab Results   Component Value Date/Time    PROTIME 14.0 01/13/2024 11:56 PM    INR 1.09 01/13/2024 11:56 PM     HgBA1c:    Lab Results   Component Value Date/Time    LABA1C 6.2 04/21/2024 12:11 PM       Assessment   Braden Risk Score: Braden Scale Score: 15    Patient Active Problem List   Diagnosis Code    Recurrent falls R29.6    Dementia (HCC) F03.90    Closed fracture of right femur, initial encounter (HCC) S72.91XA    Delirium superimposed on dementia 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    Closed subcapital fracture of neck of right femur (HCC) S72.011A    Rhabdomyolysis M62.82    Alcohol abuse F10.10    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    Urinary tract infection N39.0    UTI (urinary tract infection) N39.0    Hyperlipidemia E78.5    Urinary retention R33.9       Measurements:  Wound Pressure Injury Ankle Right;Medial (Active)   Wound Image   05/15/24 0000   Wound Type Wound 05/15/24 2048   Wound Etiology Pressure Unstageable 05/15/24 0000   Wound Dressing Status Clean;Dry;Intact 05/15/24 0841   Wound Cleansed Soap and water  05/15/24 0000   Wound Dressing/Treatment Open to air 05/16/24 0918   Wound Assessment Dry;Purple/maroon 05/15/24 0841   Wound Drainage Amount None (dry) 05/16/24 0918   Wound Drainage Description Serosanguinous 04/25/24 1015   Wound Odor None 05/16/24 0918   Peri-wound Assessment Intact;Fragile 05/15/24 0841   Wound Margins Attached edges;Defined edges 05/15/24 0000   Number of days:  Wound 04/22/24 Pressure Injury Ankle Left;Lateral (Active)   Wound Image   05/15/24 0000   Wound Type Wound 05/15/24 2048   Wound Etiology Pressure Unstageable 05/15/24 0000   Wound Dressing Status Clean;Dry;Intact 05/15/24 0841   Wound Cleansed Soap and water  05/15/24 0000   Wound Dressing/Treatment Open to air 05/16/24 0918   Wound Assessment Dry;Purple/maroon 05/15/24 0841   Wound Drainage Amount None (dry) 05/16/24 0918   Wound Drainage Description Serosanguinous 04/25/24 1015   Wound Odor None 05/16/24 0918    Peri-wound Assessment Intact;Fragile;Warm 05/15/24 0841   Wound Margins Defined edges 05/15/24 0000   Number of days: 24       Wound 05/15/24 Traumatic Abrasion Wrist Posterior;Left abrasions from fall (Active)   Wound Image   05/15/24 0000   Wound Type Wound 05/16/24 0918   Wound Etiology Traumatic 05/16/24 0918   Wound Dressing Status Other (Comment) 05/16/24 0918   Wound Drainage Amount None (dry) 05/16/24 0918   Wound Odor None 05/16/24 0918   Peri-wound Assessment Intact 05/15/24 0000   Number of days: 1            Response to treatment:  Well tolerated by patient.     Pain Assessment:  Severity:  0 / 10  Quality of pain: N/A  Wound Pain Timing/Severity: none  Premedicated: N/A    Plan   Plan of Care: Wound Pressure Injury Ankle Right;Medial-Wound Dressing/Treatment: Open to air  Wound 04/22/24 Pressure Injury Ankle Left;Lateral-Wound Dressing/Treatment: Open to air    Specialty Bed Required : N/A   []  Low Air Loss   []  Pressure Redistribution  []  Fluid Immersion  []  Bariatric  []  Total Pressure Relief  []  Other:     Current Diet: ADULT DIET; Easy to [employer]  Dietician consult:  N/A    Discharge Plan:  Placement for patient upon discharge: Skilled Nursing    Patient appropriate for Outpatient Wound Care Center: N/a    Referrals:  []  Social Worker  []  Home Health Care  []  Supplies  []  Other    Patient/Caregiver Teaching:  Level of patient/caregiver understanding able to:   []  Indicates understanding       []  Needs reinforcement  []  Unsuccessful      [x]  Verbal Understanding  []  Demonstrated understanding       []  No evidence of learning  []  Refused teaching         []  N/A       Electronically signed by   Edsel Sharps, RN 05/16/2024      "

## 2024-05-16 NOTE — Care Coordination (Signed)
"     05/16/24 0837   Readmission Assessment   Who is being [place] Other  (chart)   Number of Days since last admission? 8-30 days   Previous Disposition SNF   What was the patient's/caregiver's perception as to why they think they needed to return back to the hospital? Worsening of symptoms/Unexpected complications  (Fall & AMS)   Did you visit your Primary Care Physician after you left the hospital, before you returned this time? No   Why weren't you able to visit your PCP? Did not have an appointment   Did you see a specialist, such as Cardiac, Pulmonary, Orthopedic Physician, etc. after you left the hospital? No   Who advised the patient to return to the hospital? Skilled Unit;Caregiver   Does the patient report anything that got in the way of taking their medications? No   What could we have done to help prevent your return to the hospital? Other (Comment)  (no noted in chart)   Have You Received a Follow Up Phone Call from a HealthCare Provider After Discharge? Yes  (Left message 2x)     Electronically signed by Angeline Roup on 05/16/2024 at 9:30 AM   "

## 2024-05-17 LAB — BASIC METABOLIC PANEL W/ REFLEX TO MG FOR LOW K
Anion Gap: 13 mmol/L (ref 8–16)
BUN: 11 mg/dL (ref 8–23)
CO2: 25 mmol/L (ref 22–29)
Calcium: 9 mg/dL (ref 8.8–10.2)
Chloride: 104 mmol/L (ref 98–107)
Creatinine: 0.8 mg/dL (ref 0.7–1.2)
Est, Glom Filt Rate: >90 (ref >60)
Glucose: 117 mg/dL — ABNORMAL HIGH (ref 70–99)
Potassium reflex Magnesium: 4.1 mmol/L (ref 3.5–5.0)
Sodium: 142 mmol/L (ref 136–145)

## 2024-05-17 LAB — CBC WITH AUTO DIFFERENTIAL
Basophils %: 0.8 % (ref 0.0–1.0)
Basophils Absolute: 0.1 K/uL (ref 0.00–0.20)
Eosinophils %: 5.5 % — ABNORMAL HIGH (ref 0.0–5.0)
Eosinophils Absolute: 0.4 K/uL (ref 0.00–0.60)
Hematocrit: 40 % — ABNORMAL LOW (ref 42.0–52.0)
Hemoglobin: 12.9 g/dL — ABNORMAL LOW (ref 14.0–18.0)
Immature Granulocytes #: 0 K/uL
Lymphocytes %: 23.5 % (ref 20.0–40.0)
Lymphocytes Absolute: 1.5 K/uL (ref 1.1–4.5)
MCH: 27.3 pg (ref 27.0–31.0)
MCHC: 32.3 g/dL — ABNORMAL LOW (ref 33.0–37.0)
MCV: 84.6 fL (ref 80.0–94.0)
MPV: 9.3 fL — ABNORMAL LOW (ref 9.4–12.4)
Monocytes %: 11.3 % — ABNORMAL HIGH (ref 0.0–10.0)
Monocytes Absolute: 0.7 K/uL (ref 0.00–0.90)
Neutrophils %: 58.7 % (ref 50.0–65.0)
Neutrophils Absolute: 3.8 K/uL (ref 1.5–7.5)
Platelets: 291 K/uL (ref 130–400)
RBC: 4.73 M/uL (ref 4.70–6.10)
RDW: 13.2 % (ref 11.5–14.5)
WBC: 6.4 K/uL (ref 4.8–10.8)

## 2024-05-17 MED ORDER — LACTULOSE 10 GM/15ML PO SOLN
10 | Freq: Every day | ORAL | Status: DC
Start: 2024-05-17 — End: 2024-05-18
  Administered 2024-05-17: 18:00:00 30 g via ORAL

## 2024-05-17 MED FILL — ATORVASTATIN CALCIUM 10 MG PO TABS: 10 mg | ORAL | Qty: 1 | Fill #0

## 2024-05-17 MED FILL — VANCOMYCIN HCL 10 G IV SOLR: 10 g | INTRAVENOUS | Qty: 1250 | Fill #0

## 2024-05-17 MED FILL — QUETIAPINE FUMARATE 50 MG PO TABS: 50 mg | ORAL | Qty: 1 | Fill #0

## 2024-05-17 MED FILL — DONEPEZIL HCL 10 MG PO TABS: 10 mg | ORAL | Qty: 1 | Fill #0

## 2024-05-17 MED FILL — STIMULANT LAXATIVE 8.6-50 MG PO TABS: 8.6-50 mg | ORAL | Qty: 2 | Fill #0

## 2024-05-17 MED FILL — LISINOPRIL 10 MG PO TABS: 10 mg | ORAL | Qty: 1 | Fill #0

## 2024-05-17 MED FILL — ASPIRIN 325 MG PO TABS: 325 mg | ORAL | Qty: 1 | Fill #0

## 2024-05-17 MED FILL — POLYETHYLENE GLYCOL 3350 17 G PO PACK: 17 g | ORAL | Qty: 1 | Fill #0

## 2024-05-17 MED FILL — DISULFIRAM 250 MG PO TABS: 250 mg | ORAL | Qty: 1 | Fill #0

## 2024-05-17 MED FILL — TAMSULOSIN HCL 0.4 MG PO CAPS: 0.4 mg | ORAL | Qty: 2 | Fill #0

## 2024-05-17 MED FILL — LACTULOSE 10 GM/15ML PO SOLN: 10 GM/15ML | ORAL | Qty: 60 | Fill #0

## 2024-05-17 MED FILL — PANTOPRAZOLE SODIUM 20 MG PO TBEC: 20 mg | ORAL | Qty: 1 | Fill #0

## 2024-05-17 MED FILL — HYDRALAZINE HCL 20 MG/ML IJ SOLN: 20 mg/mL | INTRAMUSCULAR | Qty: 1 | Fill #0

## 2024-05-17 MED FILL — CEFTRIAXONE SODIUM 2 G IJ SOLR: 2 g | INTRAMUSCULAR | Qty: 2000 | Fill #0

## 2024-05-17 MED FILL — ENOXAPARIN SODIUM 40 MG/0.4ML IJ SOSY: 40 MG/0.4ML | INTRAMUSCULAR | Qty: 0.4 | Fill #0

## 2024-05-17 NOTE — Progress Notes (Signed)
"     05/17/24 1500   Subjective   Subjective Attempt.   NSG administering an enema.   PT Plan of Care   Tuesday D       Electronically signed by Laurier JINNY Sharps, PTA on 05/17/2024 at 3:36 PM   "

## 2024-05-17 NOTE — Plan of Care (Signed)
"    Problem: Safety - Adult  Goal: Free from fall injury  Outcome: Progressing     Problem: Skin/Tissue Integrity - Adult  Goal: Skin integrity remains intact  Description: 1.  Monitor for areas of redness and/or skin breakdown  2.  Assess vascular access sites hourly  3.  Every 4-6 hours minimum:  Change oxygen saturation probe site  4.  Every 4-6 hours:  If on nasal continuous positive airway pressure, respiratory therapy assess nares and determine need for appliance change or resting period  Outcome: Progressing  Goal: Incisions, wounds, or drain sites healing without S/S of infection  Outcome: Progressing     Problem: Skin/Tissue Integrity  Goal: Skin integrity remains intact  Description: 1.  Monitor for areas of redness and/or skin breakdown  2.  Assess vascular access sites hourly  3.  Every 4-6 hours minimum:  Change oxygen saturation probe site  4.  Every 4-6 hours:  If on nasal continuous positive airway pressure, respiratory therapy assess nares and determine need for appliance change or resting period  Outcome: Progressing     Problem: Nutrition Deficit:  Goal: Optimize nutritional status  Outcome: Progressing     "

## 2024-05-17 NOTE — Plan of Care (Signed)
 "  Problem: Safety - Adult  Goal: Free from fall injury  05/17/2024 1037 by Charlann Kung, RN  Outcome: Adequate for Discharge  05/17/2024 0138 by Randy Stagger, RN  Outcome: Progressing     Problem: Safety - Adult  Goal: Free from fall injury  Outcome: Progressing     Problem: Cardiovascular - Adult  Goal: Maintains optimal cardiac output and hemodynamic stability  Outcome: Adequate for Discharge  Goal: Absence of cardiac dysrhythmias or at baseline  Outcome: Adequate for Discharge     Problem: Skin/Tissue Integrity - Adult  Goal: Skin integrity remains intact  Description: 1.  Monitor for areas of redness and/or skin breakdown  2.  Assess vascular access sites hourly  3.  Every 4-6 hours minimum:  Change oxygen saturation probe site  4.  Every 4-6 hours:  If on nasal continuous positive airway pressure, respiratory therapy assess nares and determine need for appliance change or resting period  05/17/2024 1037 by Charlann Kung, RN  Outcome: Adequate for Discharge  05/17/2024 0138 by Randy Stagger, RN  Outcome: Progressing  Goal: Incisions, wounds, or drain sites healing without S/S of infection  05/17/2024 1037 by Charlann Kung, RN  Outcome: Adequate for Discharge  05/17/2024 0138 by Randy Stagger, RN  Outcome: Progressing     Problem: Gastrointestinal - Adult  Goal: Maintains or returns to baseline bowel function  Outcome: Adequate for Discharge     Problem: Genitourinary - Adult  Goal: Absence of urinary retention  Outcome: Adequate for Discharge  Goal: Urinary catheter remains patent  05/17/2024 1037 by Charlann Kung, RN  Outcome: Adequate for Discharge  05/17/2024 0138 by Randy Stagger, RN  Outcome: Progressing     Problem: Metabolic/Fluid and Electrolytes - Adult  Goal: Electrolytes maintained within normal limits  Outcome: Adequate for Discharge  Goal: Glucose maintained within prescribed range  Outcome: Adequate for Discharge     Problem: Musculoskeletal - Adult  Goal: Return  mobility to safest level of function  Outcome: Progressing  Goal: Maintain proper alignment of affected body part  Outcome: Progressing  Goal: Return ADL status to a safe level of function  Outcome: Progressing     Problem: Infection - Adult  Goal: Absence of infection at discharge  Outcome: Progressing  Goal: Absence of infection during hospitalization  Outcome: Progressing     Problem: Metabolic/Fluid and Electrolytes - Adult  Goal: Electrolytes maintained within normal limits  Outcome: Progressing     Problem: Skin/Tissue Integrity  Goal: Skin integrity remains intact  Description: 1.  Monitor for areas of redness and/or skin breakdown  2.  Assess vascular access sites hourly  3.  Every 4-6 hours minimum:  Change oxygen saturation probe site  4.  Every 4-6 hours:  If on nasal continuous positive airway pressure, respiratory therapy assess nares and determine need for appliance change or resting period  05/17/2024 1037 by Charlann Kung, RN  Outcome: Adequate for Discharge  05/17/2024 0138 by Randy Stagger, RN  Outcome: Progressing     Problem: Skin/Tissue Integrity  Goal: Skin integrity remains intact  Description: 1.  Monitor for areas of redness and/or skin breakdown  2.  Assess vascular access sites hourly  3.  Every 4-6 hours minimum:  Change oxygen saturation probe site  4.  Every 4-6 hours:  If on nasal continuous positive airway pressure, respiratory therapy assess nares and determine need for appliance change or resting period  Outcome: Progressing     Problem: Nutrition Deficit:  Goal: Optimize nutritional status  05/17/2024 1037 by Charlann Kung, RN  Outcome: Adequate for Discharge  05/17/2024 0138 by Randy Stagger, RN  Outcome: Progressing     Problem: Nutrition Deficit:  Goal: Optimize nutritional status  Outcome: Progressing     Problem: ABCDS Injury Assessment  Goal: Absence of physical injury  Outcome: Progressing     "

## 2024-05-17 NOTE — Progress Notes (Signed)
 "  [facility]      Patient:  Raymond Skinner  Date of Birth: 03-24-1956  Date of Service: 05/17/2024  MRN: [id number]   Acct: [id number]   Primary Care Physician: Joshua Dorothyann SAILOR, APRN  Advance Directive: Full Code  Admit Date: 05/14/2024       Hospital Day: 3  Portions of this note have been copied forward, however, changed to reflect the most current clinical status of this patient.  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  Patient was recently hospitalized on 12/4 due to rhabdomyolysis this improved with IVF, did have urinary retention and indwelling Foley catheter was placed.  Patient was discharged 12/8 to [facility] facility.   Patient presented to [facility] ER due to altered mental status.  Patient noted to have UTI and had been placed on oral antibiotics at SNF facility.  Without improvement SNF sent patient to [facility] ER for further evaluation.  Workup in ER CT head no acute intracranial abnormality, CT right hip no acute fracture, XR left foot mild soft tissue swelling without acute fracture, urinalysis small LE, WBC 41.  Patient initiated on Rocephin  and was admitted to hospitalist service.  Patient remains altered unable to state name, date of birth.  Urine culture moderate growth Enterococcus faecalis initiated on IV vancomycin .  Per POA present at bedside patient is normally able to state name and is able to follow simple commands at baseline. Discussed with nursing has been more oriented and following simple commands. Did self remove foley. Will monitor with routine bladder scans and external catheter. Scheduled bowel regimen.     Review of Systems:   Review of Systems   Unable to perform ROS: Mental status change         Objective:   VITALS:   BP 128/76   Pulse (!) 108   Temp 98.4 F (36.9 C) (Temporal)   Resp 18   Ht 1.829 m (6' 0.01)   Wt 71.1 kg (156 lb 12 oz)   SpO2 98%   BMI 21.25 kg/m   24HR INTAKE/OUTPUT:    Intake/Output Summary (Last 24 hours) at 05/17/2024 1204  Last data filed at 05/16/2024 1809  Gross per 24 hour   Intake --   Output 300 ml   Net -300 ml       Physical Exam  Vitals and nursing note reviewed.   Constitutional:       Appearance: Normal appearance.   HENT:      Mouth/Throat:      Mouth: Mucous membranes are moist.      Pharynx: Oropharynx is clear.   Eyes:      Extraocular Movements: Extraocular movements intact.      Conjunctiva/sclera: Conjunctivae normal.      Pupils: Pupils are equal, round, and reactive to light.   Cardiovascular:      Rate and Rhythm: Normal rate and regular rhythm.      Pulses: Normal pulses.      Heart sounds: Normal heart sounds. No murmur heard.  Pulmonary:      Effort: Pulmonary effort is normal. No respiratory distress.      Breath sounds: Normal breath sounds.   Abdominal:      General: Bowel sounds are normal.  There is no distension.      Palpations: Abdomen is soft.      Tenderness: There is no abdominal tenderness. There is no guarding or rebound.   Musculoskeletal:         General: No swelling. Normal range of motion.      Cervical back: Normal range of motion and neck supple. No rigidity or tenderness.      Right lower leg: No edema.      Left lower leg: No edema.   Skin:     General: Skin is warm and dry.      Capillary Refill: Capillary refill takes less than 2 seconds.      Comments: Pressure injury to bilateral ankles, see media   Neurological:      Mental Status: He is alert.      Cranial Nerves: No cranial nerve deficit, dysarthria or facial asymmetry.      Motor: Weakness (generalized) present.      Comments: On exam remains Disoriented to person, place, time, and situation   Able to follow simple commands   Pleasantly confused    Psychiatric:         Mood and Affect: Mood  normal.         Behavior: Behavior normal.         Medications:      sodium chloride         polyethylene glycol  17 g Oral Daily    sennosides-docusate sodium   2 tablet Oral BID    lisinopril   10 mg Oral Daily    vancomycin  (VANCOCIN ) intermittent dosing (placeholder)   Other RX Placeholder    vancomycin   1,250 mg IntraVENous Q12H    cefTRIAXone  (ROCEPHIN ) IV  2,000 mg IntraVENous Q24H    aspirin   325 mg Oral Daily    disulfiram   250 mg Oral BID    donepezil   10 mg Oral Nightly    [Held by provider] memantine   10 mg Oral BID    pantoprazole   20 mg Oral QAM AC    QUEtiapine   25 mg Oral Nightly    atorvastatin   10 mg Oral Daily    tamsulosin   0.8 mg Oral Daily    sodium chloride  flush  5-40 mL IntraVENous 2 times per day    enoxaparin   40 mg SubCUTAneous Daily     hydrALAZINE , sodium chloride  flush, sodium chloride , ondansetron  **OR** ondansetron , acetaminophen  **OR** acetaminophen , polyethylene glycol  ADULT DIET; Easy to Chew  ADULT ORAL NUTRITION SUPPLEMENT; Lunch, Dinner; Wound Healing Oral Supplement     Lab and other Data:     Recent Labs     05/15/24  0434 05/16/24  0400 05/17/24  0245   WBC 8.3 6.3 6.4   HGB 13.5* 14.4 12.9*   PLT 303 282 291     Recent Labs     05/15/24  0434 05/16/24  0400 05/17/24  0245   NA 140 139 142   K 4.0 4.0 4.1   CL 102 102 104   CO2 24 20* 25   BUN 13 12 11    CREATININE 0.7 0.6* 0.8   GLUCOSE 129* 111* 117*     Recent Labs     05/14/24  1430   AST 23   ALT 24   BILITOT 0.4   ALKPHOS 130*     Troponin T: No results for input(s): TROPONINI in the last 72 hours.  Pro-BNP: No results for input(s): BNP in the last 72 hours.  INR:  No results for input(s): INR in the last 72 hours.  UA:  Recent Labs     05/14/24  1430 05/14/24  1837   COLORU Yellow Yellow   PHUR 6.5 6.5   WBCUA TNTC* 41*   RBCUA TNTC* 641*   BACTERIA 4+* Negative   CLARITYU TURBID* CLOUDY*   LEUKOCYTESUR LARGE* SMALL*   UROBILINOGEN 1.0 1.0   BILIRUBINUR Negative Negative   BLOODU LARGE* LARGE*   GLUCOSEU Negative  Negative   AMORPHOUS 2+*  --      A1C: No results for input(s): LABA1C in the last 72 hours.  ABG:No results for input(s): PHART, PCO2ART, PO2ART, HCO3ART, BEART, HGBAE, O2SATART, CARBOXHGBART in the last 72 hours.    RAD:   XR FOOT LEFT (MIN 3 VIEWS)  Result Date: 05/16/2024   1.  Mild soft tissue swelling without visible acute fracture. 2. Chronic findings as above.    ______________________________________ Electronically signed by: NORLEEN KURK M.D. Date:     05/16/2024 Time:    06:58     CT HIP RIGHT WO CONTRAST  Result Date: 05/14/2024   1. No acute fracture.   All CT scans are performed using dose optimization techniques as appropriate to the performed exam and includes at least one of the following:  Automated exposure control, adjustment of the mA and/or kV according to size, and the use of iterative reconstruction technique.  All CT scans are performed using dose optimization techniques as appropriate to the performed exam and include at least one of the following: Automated exposure control, adjustment of the mA and/or kV according to size, and the use of iterative reconstruction technique.  ______________________________________ Electronically signed by: GUSTAVO HIGHTOWER M.D. Date:     05/14/2024 Time:    17:50     XR HIP 2-3 VW W PELVIS RIGHT  Result Date: 05/14/2024   Hip arthroplasty on the right  Degenerative changes of the spine and pelvis.  No displaced fracture evident however there is a subtle degree of lucency in the medial border of the greater trochanter which is a change from prior.  If there is concern for occult fracture recommend CT.    ______________________________________ Electronically signed by: GUSTAVO HIGHTOWER M.D. Date:     05/14/2024 Time:    16:52     CT HEAD WO CONTRAST  Result Date: 05/14/2024  1.  No acute intracranial process. 2. 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  All CT scans are performed using dose optimization techniques  as appropriate to the performed exam and include at least one of the following: Automated exposure control, adjustment of the mA and/or kV according to size, and the use of iterative reconstruction technique.  ______________________________________ Electronically signed by: GLENDIA BIRK M.D. Date:     05/14/2024 Time:    15:39     Assessment/Plan   Principal Problem:    UTI (urinary tract infection)   -Urinalysis & Culture    -IV antibiotic, Rocephin  & Vancomycin     -Monitor I&O  Active Problems    Dementia    Aricept , seroquel    Held namenda     Fall precautions   PT/OT/SLP    Alcohol abuse   Disulfiram    Hypertension    Currently 128/76   Lisinopril  initiated   As needed anti-hypertensive      Hyperlipidemia   Statin therapy     Urinary retention   Self removed today    Bladder scan routinely    Flomax   Monitor I&O    Moderate malnutrition   Dieitican consulted   Constipation    Scheduled bowel regimen increased     DVT Prophylaxis: lovenox       Raymond CHRISTELLA Pinal, APRN - CNP, 05/17/2024 12:04 PM  "

## 2024-05-18 LAB — BASIC METABOLIC PANEL W/ REFLEX TO MG FOR LOW K
Anion Gap: 14 mmol/L (ref 8–16)
BUN: 13 mg/dL (ref 8–23)
CO2: 23 mmol/L (ref 22–29)
Calcium: 9.5 mg/dL (ref 8.8–10.2)
Chloride: 104 mmol/L (ref 98–107)
Creatinine: 0.8 mg/dL (ref 0.7–1.2)
Est, Glom Filt Rate: >90 (ref >60)
Glucose: 131 mg/dL — ABNORMAL HIGH (ref 70–99)
Potassium reflex Magnesium: 3.9 mmol/L (ref 3.5–5.0)
Sodium: 141 mmol/L (ref 136–145)

## 2024-05-18 LAB — CULTURE, URINE: Urine Culture, Routine: <10000 — AB

## 2024-05-18 LAB — CBC WITH AUTO DIFFERENTIAL
Basophils %: 0.8 % (ref 0.0–1.0)
Basophils Absolute: 0.1 K/uL (ref 0.00–0.20)
Eosinophils %: 3.8 % (ref 0.0–5.0)
Eosinophils Absolute: 0.3 K/uL (ref 0.00–0.60)
Hematocrit: 40.4 % — ABNORMAL LOW (ref 42.0–52.0)
Hemoglobin: 13.2 g/dL — ABNORMAL LOW (ref 14.0–18.0)
Immature Granulocytes #: 0 K/uL
Lymphocytes %: 19.6 % — ABNORMAL LOW (ref 20.0–40.0)
Lymphocytes Absolute: 1.4 K/uL (ref 1.1–4.5)
MCH: 27.4 pg (ref 27.0–31.0)
MCHC: 32.7 g/dL — ABNORMAL LOW (ref 33.0–37.0)
MCV: 83.8 fL (ref 80.0–94.0)
MPV: 9.4 fL (ref 9.4–12.4)
Monocytes %: 9.5 % (ref 0.0–10.0)
Monocytes Absolute: 0.7 K/uL (ref 0.00–0.90)
Neutrophils %: 66.2 % — ABNORMAL HIGH (ref 50.0–65.0)
Neutrophils Absolute: 4.8 K/uL (ref 1.5–7.5)
Platelets: 303 K/uL (ref 130–400)
RBC: 4.82 M/uL (ref 4.70–6.10)
RDW: 13.2 % (ref 11.5–14.5)
WBC: 7.2 K/uL (ref 4.8–10.8)

## 2024-05-18 LAB — VANCOMYCIN LEVEL, TROUGH: Vancomycin Tr: 10 ug/mL (ref 10.0–20.0)

## 2024-05-18 MED ORDER — METOPROLOL TARTRATE 25 MG PO TABS
25 | Freq: Two times a day (BID) | ORAL | Status: DC
Start: 2024-05-18 — End: 2024-05-20
  Administered 2024-05-18 – 2024-05-20 (×5): 25 mg via ORAL

## 2024-05-18 MED ORDER — QUETIAPINE FUMARATE 50 MG PO TABS
50 | Freq: Every evening | ORAL | Status: DC
Start: 2024-05-18 — End: 2024-05-23
  Administered 2024-05-19 – 2024-05-23 (×5): 50 mg via ORAL

## 2024-05-18 MED ORDER — VANCOMYCIN HCL 1 G IV SOLR
1 | Freq: Two times a day (BID) | INTRAVENOUS | Status: AC
Start: 2024-05-18 — End: 2024-05-23
  Administered 2024-05-18 – 2024-05-23 (×10): 1250 mg via INTRAVENOUS

## 2024-05-18 MED ORDER — DOXYCYCLINE HYCLATE 100 MG IV SOLR
100 | Freq: Two times a day (BID) | INTRAVENOUS | Status: DC
Start: 2024-05-18 — End: 2024-05-18
  Administered 2024-05-18: 14:00:00 100 mg via INTRAVENOUS

## 2024-05-18 MED ORDER — VANCOMYCIN INTERMITTENT DOSING (PLACEHOLDER)
INTRAVENOUS | Status: DC
Start: 2024-05-18 — End: 2024-05-23

## 2024-05-18 MED FILL — STIMULANT LAXATIVE 8.6-50 MG PO TABS: 8.6-50 mg | ORAL | Qty: 2 | Fill #0

## 2024-05-18 MED FILL — HYDRALAZINE HCL 20 MG/ML IJ SOLN: 20 mg/mL | INTRAMUSCULAR | Qty: 1 | Fill #0

## 2024-05-18 MED FILL — VANCOMYCIN HCL 10 G IV SOLR: 10 g | INTRAVENOUS | Qty: 1250 | Fill #0

## 2024-05-18 MED FILL — DISULFIRAM 250 MG PO TABS: 250 mg | ORAL | Qty: 1 | Fill #0

## 2024-05-18 MED FILL — TAMSULOSIN HCL 0.4 MG PO CAPS: 0.4 mg | ORAL | Qty: 2 | Fill #0

## 2024-05-18 MED FILL — PANTOPRAZOLE SODIUM 20 MG PO TBEC: 20 mg | ORAL | Qty: 1 | Fill #0

## 2024-05-18 MED FILL — VANCOMYCIN HCL 1 G IV SOLR: 1 g | INTRAVENOUS | Qty: 1250 | Fill #0

## 2024-05-18 MED FILL — ASPIRIN 325 MG PO TABS: 325 mg | ORAL | Qty: 1 | Fill #0

## 2024-05-18 MED FILL — ENOXAPARIN SODIUM 40 MG/0.4ML IJ SOSY: 40 MG/0.4ML | INTRAMUSCULAR | Qty: 0.4 | Fill #0

## 2024-05-18 MED FILL — METOPROLOL TARTRATE 25 MG PO TABS: 25 mg | ORAL | Qty: 1 | Fill #0

## 2024-05-18 MED FILL — ATORVASTATIN CALCIUM 10 MG PO TABS: 10 mg | ORAL | Qty: 1 | Fill #0

## 2024-05-18 MED FILL — DONEPEZIL HCL 10 MG PO TABS: 10 mg | ORAL | Qty: 1 | Fill #0

## 2024-05-18 MED FILL — QUETIAPINE FUMARATE 50 MG PO TABS: 50 mg | ORAL | Qty: 1 | Fill #0

## 2024-05-18 MED FILL — LISINOPRIL 10 MG PO TABS: 10 mg | ORAL | Qty: 1 | Fill #0

## 2024-05-18 MED FILL — DOXYCYCLINE HYCLATE 100 MG IV SOLR: 100 mg | INTRAVENOUS | Qty: 100 | Fill #0

## 2024-05-18 MED FILL — CEFTRIAXONE SODIUM 2 G IJ SOLR: 2 g | INTRAMUSCULAR | Qty: 2000 | Fill #0

## 2024-05-18 NOTE — Progress Notes (Signed)
"  Nutrition Assessment     Type and Reason for Visit: Reassess    Nutrition Recommendations/Plan:   Will cont to monitor PO intake of meals and ONS     Malnutrition Assessment:  Malnutrition Status: Moderate malnutrition    Nutrition Assessment:  Follow up on pt. Visited pt this morning. Pt receiving Soft Easy to Limited brands. Per documentaiton pt is eating 1-50% of meals. Unable to assess Juven intake. Aware of 18# weight increase per weight hx. Aware of current weight being closer to admission weight. Aware of BLE trace. Will cont to monitor PO intake of meals and ONS.    Estimated Daily Nutrient Needs:  Energy (kcal):  1778-2133 kcal (25-30 kcal/kg) Weight Used for Energy Requirements: Current     Protein (g):  71-142 g (1-2 g/kg) Weight Used for Protein Requirements: Current        Fluid (ml/day):  8221-7866 ml/kcal Method Used for Fluid Requirements: 1 ml/kcal    Nutrition Related Findings:   Na 141, GFR >90, Glu 111-131. BLE trace edema. Last BM: 12/31. Wound Type: Multiple, Traumatic Wounds, Pressure Injury    Current Nutrition Therapies:    ADULT DIET; Easy to Chew  ADULT ORAL NUTRITION SUPPLEMENT; Lunch, Dinner; Wound Healing Oral Supplement    Anthropometric Measures:  Height: 182.9 cm (6' 0.01)  Current Body Wt: 79 kg (174 lb 2.6 oz)   BMI: 23.6      Nutrition Diagnosis:   Inadequate oral intake related to increase demand for energy/nutrients as evidenced by wounds    Nutrition Interventions:   Food and/or Nutrient Delivery: Continue Current Diet, Continue Oral Nutrition Supplement  Nutrition Education/Counseling: No recommendation at this time  Coordination of Nutrition Care: Continue to monitor while inpatient  Plan of Care discussed with: Pt    Goals:  Goals: Meet at least 75% of estimated needs, PO intake 50% or greater, Maintain adequate nutrition status  Type of Goal: Continue current goal  Previous Goal Met: No Progress toward Goal(s)    Nutrition Monitoring and Evaluation:   Behavioral-Environmental  Outcomes: None Identified  Food/Nutrient Intake Outcomes: Food and Nutrient Intake, Supplement Intake  Physical Signs/Symptoms Outcomes: Biochemical Data, Fluid Status or Edema, Meal Time Behavior, Nutrition Focused Physical Findings, Skin, Weight    Discharge Planning:    Continue Oral Nutrition Supplement, Continue current diet     Raymond Skinner  Contact: [phone]    "

## 2024-05-18 NOTE — Progress Notes (Signed)
"  Physical Therapy  Name: Raymond Skinner  MRN:  [id number]  Date of service:  05/18/2024     05/18/24 1014   Restrictions/Precautions   Restrictions/Precautions Fall Risk   Activity Level Up with Assist   Required Braces or Orthoses? 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   General   Chart Reviewed Yes   Additional Pertinent Hx R hip fx, THA 01/14/24; recent hospital admit   Family/Caregiver Present No   Referring Practitioner Tobie Sallee NOVAK, APRN - CNP   Referral Date  05/15/24   Diagnosis disorientation, UTI, hematuria, falls   Follows Commands X   Subjective   Subjective Pt in bed, talking but not making sense   Pain   Pre-Pain 0   Post-Pain 0   Orientation   Overall Orientation Status X   Bed mobility   Supine to Sit Substantial/Maximal assistance;2 Person assistance   Sit to Supine Substantial/Maximal assistance;2 Person assistance   Scooting Substantial/Maximal assistance;2 Person assistance   Transfers   Sit to Stand Unable to assess   Comment attempted to stand severalx with A x 2  (Pt leaning backwards and not able to understand how to A with tfer)   Ambulation   Comments did not attempt due to poor cognition   Patient Goals    Patient Goals  not stated   Short Term Goals   Time Frame for Short Term Goals 2 wks   Short Term Goal 1 supine to sit Min A   Short Term Goal 2 sit to stand Min A   Short Term Goal 3 bed to chair Min A   Short Term Goal 4 amb. 59' with RW CGA   Activity Tolerance   Activity Tolerance Patient tolerated treatment well   Assessment   Assessment pt with limited toleration due to cognition  Pt with A x 2 to perform bed mob and sit eob   pt needing post support and constant cueing to maintain posture eob   Therapist attempted sit stand with pt with A x 2 , however pt unable to comprehend and pt never coming to stand  pt returned to bed with safety and comfort measrues   Discharge Recommendations Continue to assess pending  progress;24 hour supervision or assist;Patient would benefit from continued therapy after discharge   Physical Therapy Plan   General Plan 5-7 times per week   Therapy Duration 2 Weeks   Current Treatment Recommendations Strengthening;ROM;Functional mobility training;Balance training;Transfer training;Gait training;Endurance training;Equipment evaluation, education, & procurement;Patient/Caregiver education & training;Therapeutic activities;Safety education & training;Positioning   PT Plan of Care   Wednesday X   Safety Devices   Type of Devices Call light within reach;Bed alarm in place;Left in bed;Telesitter in use   PT Whiteboard Notes   Therapy Whiteboard RE 1/12 UTI, falls   Recommendation   Requires PT Follow-Up Yes           Electronically signed by Khristen Cheyney, PTA on 05/18/2024 at 12:48 PM     "

## 2024-05-18 NOTE — Progress Notes (Signed)
"   [facility]   Pharmacy Pharmacokinetic Monitoring Service - Vancomycin     Consulting Provider: Leontine Pinal   Indication: UTI  Target Concentration: Goal AUC/MIC 400-600 mg*hr/L  Day of Therapy: 3  Additional Antimicrobials: none    Pertinent Laboratory Values:   Wt Readings from Last 1 Encounters:   05/18/24 79 kg (174 lb 3.2 oz)     Temp Readings from Last 1 Encounters:   05/18/24 97.2 F (36.2 C) (Temporal)     Estimated Creatinine Clearance: 98 mL/min (based on SCr of 0.8 mg/dL).  Recent Labs     05/17/24  0245 05/18/24  0110   CREATININE 0.8 0.8   BUN 11 13   WBC 6.4 7.2       Pertinent Cultures:  Culture Date Source Results   05/14/24 urine Corynebacterium striatum  Enterococcus faecalis   MRSA Nasal Swab: N/A. Non-respiratory infection.    Recent vancomycin  administrations                     vancomycin  (VANCOCIN ) 1,250 mg in sodium chloride  0.9 % 250 mL IVPB (mg) 1,250 mg New Bag 05/18/24 0322     1,250 mg New Bag 05/17/24 1411     1,250 mg New Bag  0201    vancomycin  (VANCOCIN ) 1,750 mg in sodium chloride  0.9 % 500 mL IVPB (mg) 1,750 mg New Bag 05/16/24 1239                      Plan:  Re-consulted to dose vancomycin  for UTI  Restart previous regimen of vancomycin  1250 mg Q12H  Anticipated AUC 521 and trough 15.6  Pharmacy will continue to monitor patient and adjust therapy as indicated  No repeat vancomycin  levels ordered at this time. Patient was therapeutic this morning    Thank you for the consult,  EMILY NECKEL, RPH  05/18/2024 5:43 PM   "

## 2024-05-18 NOTE — Progress Notes (Signed)
 "  [facility]      Patient:  Raymond Skinner  Date of Birth: October 21, 1955  Date of Service: 05/18/2024  MRN: [id number]   Acct: [id number]   Primary Care Physician: Joshua Dorothyann SAILOR, APRN  Advance Directive: Full Code  Admit Date: 05/14/2024       Hospital Day: 4  Portions of this note have been copied forward, however, changed to reflect the most current clinical status of this patient.  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  Patient was recently hospitalized on 12/4 due to rhabdomyolysis this improved with IVF, did have urinary retention and indwelling Foley catheter was placed.  Patient was discharged 12/8 to [facility] facility.   Patient presented to [facility] ER due to altered mental status.  Patient noted to have UTI and had been placed on oral antibiotics at SNF facility.  Without improvement SNF sent patient to [facility] ER for further evaluation.  Workup in ER CT head no acute intracranial abnormality, CT right hip no acute fracture, XR left foot mild soft tissue swelling without acute fracture, urinalysis small LE, WBC 41.  Patient initiated on Rocephin  and was admitted to hospitalist service.  Patient remains altered unable to state name, date of birth.  Urine culture moderate growth Enterococcus faecalis initiated on IV vancomycin .  Per POA present at bedside patient is normally able to state name and is able to follow simple commands at baseline. Discussed with nursing has been more oriented and following simple commands. Did self remove foley. Will monitor with routine bladder scans and external catheter, acute retention foley catheter replaced.     Review of Systems:   Review of Systems   Unable to perform ROS: Mental status change         Objective:   VITALS:  BP  110/71   Pulse 74   Temp 97.2 F (36.2 C) (Temporal)   Resp 18   Ht 1.829 m (6' 0.01)   Wt 79 kg (174 lb 3.2 oz)   SpO2 98%   BMI 23.62 kg/m   24HR INTAKE/OUTPUT:    Intake/Output Summary (Last 24 hours) at 05/18/2024 1738  Last data filed at 05/18/2024 0515  Gross per 24 hour   Intake --   Output 1400 ml   Net -1400 ml       Physical Exam  Vitals and nursing note reviewed.   Constitutional:       Appearance: Normal appearance.   HENT:      Mouth/Throat:      Mouth: Mucous membranes are moist.      Pharynx: Oropharynx is clear.   Eyes:      Extraocular Movements: Extraocular movements intact.      Conjunctiva/sclera: Conjunctivae normal.      Pupils: Pupils are equal, round, and reactive to light.   Cardiovascular:      Rate and Rhythm: Normal rate and regular rhythm.      Pulses: Normal pulses.      Heart sounds: Normal heart sounds. No murmur heard.  Pulmonary:      Effort: Pulmonary effort is normal. No respiratory distress.      Breath sounds: Normal breath sounds.   Abdominal:      General: Bowel sounds are normal. There is no distension.  Palpations: Abdomen is soft.      Tenderness: There is no abdominal tenderness. There is no guarding or rebound.   Musculoskeletal:         General: No swelling. Normal range of motion.      Cervical back: Normal range of motion and neck supple. No rigidity or tenderness.      Right lower leg: No edema.      Left lower leg: No edema.   Skin:     General: Skin is warm and dry.      Capillary Refill: Capillary refill takes less than 2 seconds.      Comments: Pressure injury to bilateral ankles, see media   Neurological:      Mental Status: He is alert.      Cranial Nerves: No cranial nerve deficit, dysarthria or facial asymmetry.      Motor: Weakness (generalized) present.      Comments: Disoriented to  place, time, and situation   Able to follow simple commands   Pleasantly confused    Psychiatric:         Mood and Affect: Mood normal.         Behavior: Behavior  normal.         Medications:      sodium chloride         metoprolol  tartrate  25 mg Oral BID    QUEtiapine   50 mg Oral Nightly    lisinopril   10 mg Oral Daily    aspirin   325 mg Oral Daily    disulfiram   250 mg Oral BID    donepezil   10 mg Oral Nightly    [Held by provider] memantine   10 mg Oral BID    pantoprazole   20 mg Oral QAM AC    atorvastatin   10 mg Oral Daily    tamsulosin   0.8 mg Oral Daily    sodium chloride  flush  5-40 mL IntraVENous 2 times per day    enoxaparin   40 mg SubCUTAneous Daily     hydrALAZINE , sodium chloride  flush, sodium chloride , ondansetron  **OR** ondansetron , acetaminophen  **OR** acetaminophen , polyethylene glycol  ADULT DIET; Easy to Chew  ADULT ORAL NUTRITION SUPPLEMENT; Lunch, Dinner; Wound Healing Oral Supplement     Lab and other Data:     Recent Labs     05/16/24  0400 05/17/24  0245 05/18/24  0110   WBC 6.3 6.4 7.2   HGB 14.4 12.9* 13.2*   PLT 282 291 303     Recent Labs     05/16/24  0400 05/17/24  0245 05/18/24  0110   NA 139 142 141   K 4.0 4.1 3.9   CL 102 104 104   CO2 20* 25 23   BUN 12 11 13    CREATININE 0.6* 0.8 0.8   GLUCOSE 111* 117* 131*     No results for input(s): AST, ALT, BILITOT, ALKPHOS in the last 72 hours.    Invalid input(s): ALB    Troponin T: No results for input(s): TROPONINI in the last 72 hours.  Pro-BNP: No results for input(s): BNP in the last 72 hours.  INR: No results for input(s): INR in the last 72 hours.  UA:  No results for input(s): NITRITE, COLORU, PHUR, LABCAST, WBCUA, RBCUA, MUCUS, TRICHOMONAS, YEAST, BACTERIA, CLARITYU, SPECGRAV, LEUKOCYTESUR, UROBILINOGEN, BILIRUBINUR, BLOODU, GLUCOSEU, AMORPHOUS in the last 72 hours.    Invalid input(s): KETONESU    A1C: No results for input(s): LABA1C in the last 72 hours.  ABG:No  results for input(s): PHART, PCO2ART, PO2ART, HCO3ART, BEART, HGBAE, O2SATART, CARBOXHGBART in the last 72 hours.    RAD:   XR FOOT LEFT (MIN 3 VIEWS)  Result  Date: 05/16/2024   1.  Mild soft tissue swelling without visible acute fracture. 2. Chronic findings as above.    ______________________________________ Electronically signed by: NORLEEN KURK M.D. Date:     05/16/2024 Time:    06:58     CT HIP RIGHT WO CONTRAST  Result Date: 05/14/2024   1. No acute fracture.   All CT scans are performed using dose optimization techniques as appropriate to the performed exam and includes at least one of the following:  Automated exposure control, adjustment of the mA and/or kV according to size, and the use of iterative reconstruction technique.  All CT scans are performed using dose optimization techniques as appropriate to the performed exam and include at least one of the following: Automated exposure control, adjustment of the mA and/or kV according to size, and the use of iterative reconstruction technique.  ______________________________________ Electronically signed by: GUSTAVO HIGHTOWER M.D. Date:     05/14/2024 Time:    17:50     XR HIP 2-3 VW W PELVIS RIGHT  Result Date: 05/14/2024   Hip arthroplasty on the right  Degenerative changes of the spine and pelvis.  No displaced fracture evident however there is a subtle degree of lucency in the medial border of the greater trochanter which is a change from prior.  If there is concern for occult fracture recommend CT.    ______________________________________ Electronically signed by: GUSTAVO HIGHTOWER M.D. Date:     05/14/2024 Time:    16:52     CT HEAD WO CONTRAST  Result Date: 05/14/2024  1.  No acute intracranial process. 2. 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  All CT scans are performed using dose optimization techniques as appropriate to the performed exam and include at least one of the following: Automated exposure control, adjustment of the mA and/or kV according to size, and the use of iterative reconstruction technique.  ______________________________________ Electronically signed by:  GLENDIA BIRK M.D. Date:     05/14/2024 Time:    15:39     Assessment/Plan   Principal Problem:    UTI (urinary tract infection)   -Urinalysis & Culture    -IV antibiotic Vancomycin     -Monitor I&O  Active Problems    Dementia    Aricept , seroquel    Held namenda     Fall precautions   PT/OT/SLP    Alcohol abuse   Disulfiram    Hypertension    Currently 110/70   Lisinopril  initiated   As needed anti-hypertensive      Hyperlipidemia   Statin therapy     Urinary retention   Self removed today, and replaced due to retention    Bladder scan routinely    Flomax     Monitor I&O    Moderate malnutrition   Dieitican consulted   Constipation    Scheduled bowel regimen increased     DVT Prophylaxis: lovenox       Leontine CHRISTELLA Pinal, APRN - CNP, 05/18/2024 5:38 PM  "

## 2024-05-18 NOTE — Progress Notes (Signed)
"  Occupational Therapy     05/18/24 1200   Subjective   Subjective Pt in bed upon arrival for therapy. Pt requires encouragement to awaken.   Pain Assessment   Pain Assessment 0-10   Pain Level 0   Cognition   Overall Cognitive Status Exceptions   Cognition Comment Difficulty following commands. Requires increased cues.   Orientation   Overall Orientation Status X   Bed Mobility Training   Bed Mobility Training Yes   Overall Level of Assistance Partial/Moderate assistance;Substantial/Maximal assistance;2 Person assistance   Interventions Verbal cues;Tactile cues;Manual cues;Visual cues   Supine to Sit Partial/Moderate assistance;Substantial/Maximal assistance;2 Person assistance   Sit to Supine Partial/Moderate assistance;Substantial/Maximal assistance;2 Person assistance   Scooting Partial/Moderate assistance;Substantial/Maximal assistance;2 Person assistance   Transfer Training   Transfer Training No   Balance   Sitting Impaired   Sitting - Static Poor (constant support);Occasional   Sitting - Dynamic Poor (constant support)   Standing Impaired   Standing - Static Poor   Standing - Dynamic Poor   ADL   Feeding Setup;Supervision;Minimal assistance;Moderate assistance;Verbal cueing;Increased time to complete   Grooming Setup;Maximum assistance;Verbal cueing   UE Bathing Setup;Maximum assistance;Verbal cueing   LE Bathing Setup;Maximum assistance;Verbal cueing   UE Dressing Setup;Maximum assistance;Verbal cueing   LE Dressing Setup;Maximum assistance;Verbal cueing   Putting On/Taking Off Footwear Setup;Maximum assistance;Verbal cueing   Toileting Maximum assistance;Dependent/Total   Functional Mobility Maximum assistance;Dependent/Total   Functional Mobility Skilled Clinical Factors x2   Additional Comments Increased difficulty maintaining sitting balance and unable to stand at EOB. Greatly limited by cognition.   Assessment   Assessment Tx focused on sitting EOB requiring Mod-Max assist x2 with increased posterior  lean; requiring increased cues to complete tasks. Pt instructed on STS mobility at EOB and attempts to scoot to [facility]. Pt unable to stand with Max assist x2 and unable to scoot. Pt strong with posterior leaning and unable to understand cues. Pt assisted back to bed and repositioned for comfort.   Activity Tolerance Treatment limited secondary to decreased cognition;Patient limited by endurance   Discharge Recommendations 24 hour supervision or assist;Patient would benefit from continued therapy after discharge   Occupational Therapy Plan   Times Per Week 3-5   Times Per Day Once a day   OT Plan of Care   Wednesday X     Electronically signed by Almarie Acre, OTA on 05/18/2024 at 12:15 PM    "

## 2024-05-18 NOTE — Progress Notes (Signed)
"  4 Eyes Skin Assessment     NAME:  Raymond Skinner  DATE OF BIRTH:  1955/09/08  MEDICAL RECORD NUMBER:  [id number]    The patient is being assessed for  Admission    I agree that at least one RN has performed a thorough Head to Toe Skin Assessment on the patient. ALL assessment sites listed below have been assessed.      Areas assessed by both nurses:    Head, Face, Ears, Shoulders, Back, Chest, Arms, Elbows, Hands, Sacrum. Buttock, Coccyx, Ischium, Legs. Feet and Heels, and Under Medical Devices         Does the Patient have a Wound? Yes wound(s) were present on assessment. LDA wound assessment was Initiated and completed by RN       Braden Prevention initiated by RN: Yes  Wound Care Orders initiated by RN: Yes    For hospital-acquired stage 1 & 2 and ALL Stage 3,4, Unstageable, DTI, NWPT, and Complex wounds: place order IP Wound Care/Ostomy Nurse Eval and Treat by RN under ORDER ENTRY: Yes    New Ostomies, if present place, Ostomy referral order under ORDER ENTRY: Yes     Nurse 1 eSignature: Electronically signed by Johnnie SHAUNNA Gin, RN on 05/18/24 at 10:13 AM CST    **SHARE this note so that the co-signing nurse can place an eSignature**    Nurse 2 eSignature: Electronically signed by THERISA GOBBLE, RN on 05/18/24 at 10:18 AM CST    "

## 2024-05-18 NOTE — Plan of Care (Signed)
"    Problem: Safety - Adult  Goal: Free from fall injury  05/18/2024 2255 by Ann Houston, RN  Outcome: Progressing  05/18/2024 0913 by Charlann Kung, RN  Outcome: Progressing     Problem: Musculoskeletal - Adult  Goal: Return mobility to safest level of function  05/18/2024 2255 by Ann Houston, RN  Outcome: Progressing  05/18/2024 0913 by Charlann Kung, RN  Outcome: Progressing  Goal: Maintain proper alignment of affected body part  05/18/2024 2255 by Ann Houston, RN  Outcome: Progressing  05/18/2024 0913 by Charlann Kung, RN  Outcome: Progressing  Goal: Return ADL status to a safe level of function  05/18/2024 2255 by Ann Houston, RN  Outcome: Progressing  05/18/2024 0913 by Charlann Kung, RN  Outcome: Progressing     Problem: Infection - Adult  Goal: Absence of infection at discharge  05/18/2024 2255 by Ann Houston, RN  Outcome: Progressing  05/18/2024 0913 by Charlann Kung, RN  Outcome: Progressing  Goal: Absence of infection during hospitalization  05/18/2024 2255 by Ann Houston, RN  Outcome: Progressing  05/18/2024 0913 by Charlann Kung, RN  Outcome: Progressing     Problem: Metabolic/Fluid and Electrolytes - Adult  Goal: Electrolytes maintained within normal limits  05/18/2024 2255 by Ann Houston, RN  Outcome: Progressing  05/18/2024 0913 by Charlann Kung, RN  Outcome: Progressing     Problem: Skin/Tissue Integrity  Goal: Skin integrity remains intact  Description: 1.  Monitor for areas of redness and/or skin breakdown  2.  Assess vascular access sites hourly  3.  Every 4-6 hours minimum:  Change oxygen saturation probe site  4.  Every 4-6 hours:  If on nasal continuous positive airway pressure, respiratory therapy assess nares and determine need for appliance change or resting period  05/18/2024 2255 by Ann Houston, RN  Outcome: Progressing  05/18/2024 0913 by Charlann Kung, RN  Outcome: Progressing     Problem: Nutrition Deficit:  Goal: Optimize nutritional  status  05/18/2024 2255 by Ann Houston, RN  Outcome: Progressing  Flowsheets (Taken 05/18/2024 1101 by Evertt Fish)  Nutrient intake appropriate for improving, restoring, or maintaining nutritional needs:   Assess nutritional status and recommend course of action   Monitor oral intake, labs, and treatment plans   Recommend appropriate diets, oral nutritional supplements, and vitamin/mineral supplements  05/18/2024 0913 by Charlann Kung, RN  Outcome: Progressing     Problem: ABCDS Injury Assessment  Goal: Absence of physical injury  05/18/2024 2255 by Ann Houston, RN  Outcome: Progressing  05/18/2024 0913 by Charlann Kung, RN  Outcome: Progressing     "

## 2024-05-18 NOTE — Plan of Care (Signed)
"    Problem: Safety - Adult  Goal: Free from fall injury  Recent Flowsheet Documentation  Taken 05/17/2024 2328 by Sharl Herring, LPN  Free From Fall Injury: Instruct family/caregiver on patient safety     Problem: Safety - Adult  Goal: Free from fall injury  Recent Flowsheet Documentation  Taken 05/17/2024 2328 by Sharl Herring, LPN  Free From Fall Injury: Instruct family/caregiver on patient safety     Problem: Safety - Adult  Goal: Free from fall injury  05/18/2024 0913 by Charlann Kung, RN  Outcome: Progressing  05/17/2024 2329 by Sharl Herring, LPN  Outcome: Progressing  Flowsheets (Taken 05/17/2024 2328)  Free From Fall Injury: Instruct family/caregiver on patient safety     Problem: Musculoskeletal - Adult  Goal: Return mobility to safest level of function  05/18/2024 0913 by Charlann Kung, RN  Outcome: Progressing  05/17/2024 2329 by Sharl Herring, LPN  Outcome: Progressing  Goal: Maintain proper alignment of affected body part  05/18/2024 0913 by Charlann Kung, RN  Outcome: Progressing  05/17/2024 2329 by Sharl Herring, LPN  Outcome: Progressing  Goal: Return ADL status to a safe level of function  05/18/2024 0913 by Charlann Kung, RN  Outcome: Progressing  05/17/2024 2329 by Sharl Herring, LPN  Outcome: Progressing     Problem: Infection - Adult  Goal: Absence of infection at discharge  05/18/2024 0913 by Charlann Kung, RN  Outcome: Progressing  05/17/2024 2329 by Sharl Herring, LPN  Outcome: Progressing  Goal: Absence of infection during hospitalization  05/18/2024 0913 by Charlann Kung, RN  Outcome: Progressing  05/17/2024 2329 by Sharl Herring, LPN  Outcome: Progressing     Problem: Metabolic/Fluid and Electrolytes - Adult  Goal: Electrolytes maintained within normal limits  05/18/2024 0913 by Charlann Kung, RN  Outcome: Progressing  05/17/2024 2329 by Sharl Herring, LPN  Outcome: Progressing     Problem: Skin/Tissue Integrity  Goal: Skin integrity remains intact  Description:  1.  Monitor for areas of redness and/or skin breakdown  2.  Assess vascular access sites hourly  3.  Every 4-6 hours minimum:  Change oxygen saturation probe site  4.  Every 4-6 hours:  If on nasal continuous positive airway pressure, respiratory therapy assess nares and determine need for appliance change or resting period  05/18/2024 0913 by Charlann Kung, RN  Outcome: Progressing  05/17/2024 2329 by Sharl Herring, LPN  Outcome: Progressing     Problem: Nutrition Deficit:  Goal: Optimize nutritional status  05/18/2024 0913 by Charlann Kung, RN  Outcome: Progressing  05/17/2024 2329 by Sharl Herring, LPN  Outcome: Progressing     Problem: ABCDS Injury Assessment  Goal: Absence of physical injury  05/18/2024 0913 by Charlann Kung, RN  Outcome: Progressing  05/17/2024 2329 by Sharl Herring, LPN  Outcome: Progressing        "

## 2024-05-18 NOTE — Care Coordination (Signed)
"  Sw has started the pre-cert for pt to return back to SNF. Status is pending at this time.   Electronically signed by MARDI CHIME, MSW on 05/18/2024 at 1:12 PM    "

## 2024-05-18 NOTE — Progress Notes (Signed)
"  [facility]   Pharmacy Pharmacokinetic Monitoring Service - Vancomycin     Consulting Provider: Leontine Pinal, APRN  Indication: UTI  Therapeutic Target: AUC 400-600 mg*hr/L  Day of Therapy: 3  Additional Antimicrobials: Ceftriaxone     Pertinent Laboratory Values:   Wt Readings from Last 1 Encounters:   05/14/24 71.1 kg (156 lb 12 oz)     Temp Readings from Last 1 Encounters:   05/18/24 97.5 F (36.4 C)     Estimated Creatinine Clearance: 90 mL/min (based on SCr of 0.8 mg/dL).  Recent Labs     05/17/24  0245 05/18/24  0110   CREATININE 0.8 0.8   BUN 11 13   WBC 6.4 7.2     Procalcitonin: None     Pertinent Cultures:  Culture Date Source Results   05/14/24 Urine x2 Enterococcous faecalis  Corynebacterium striatum    MRSA Nasal Swab: N/A. Non-respiratory infection.    Recent vancomycin  administrations                     vancomycin  (VANCOCIN ) 1,250 mg in sodium chloride  0.9 % 250 mL IVPB (mg) 1,250 mg New Bag 05/17/24 1411     1,250 mg New Bag  0201    vancomycin  (VANCOCIN ) 1,750 mg in sodium chloride  0.9 % 500 mL IVPB (mg) 1,750 mg New Bag 05/16/24 1239               Assessment:  Date/Time Current Dose Concentration Timing of Concentration (h) AUC   05/18/24 1250 mg Q IV q 12 hrs 10 10 hr 58 min 527   Note: Serum concentrations collected for AUC dosing may appear elevated if collected in close proximity to the dose administered, this is not necessarily an indication of toxicity    Plan:  Current dosing regimen is therapeutic  Continue current dose  No repeat vancomycin  concentration ordered at this time  Pharmacy will continue to monitor patient and adjust therapy as indicated    Thank you for the consult,  [facility] Eathan Groman, [facility]  [id number] 6:90 AM   "

## 2024-05-19 LAB — BASIC METABOLIC PANEL W/ REFLEX TO MG FOR LOW K
Anion Gap: 9 mmol/L (ref 8–16)
BUN: 17 mg/dL (ref 8–23)
CO2: 24 mmol/L (ref 22–29)
Calcium: 9.5 mg/dL (ref 8.8–10.2)
Chloride: 103 mmol/L (ref 98–107)
Creatinine: 0.7 mg/dL (ref 0.7–1.2)
Est, Glom Filt Rate: >90 (ref >60)
Glucose: 123 mg/dL — ABNORMAL HIGH (ref 70–99)
Potassium reflex Magnesium: 4 mmol/L (ref 3.5–5.0)
Sodium: 136 mmol/L (ref 136–145)

## 2024-05-19 LAB — CBC WITH AUTO DIFFERENTIAL
Basophils %: 0.9 % (ref 0.0–1.0)
Basophils Absolute: 0.1 K/uL (ref 0.00–0.20)
Eosinophils %: 4.8 % (ref 0.0–5.0)
Eosinophils Absolute: 0.3 K/uL (ref 0.00–0.60)
Hematocrit: 40.6 % — ABNORMAL LOW (ref 42.0–52.0)
Hemoglobin: 13.2 g/dL — ABNORMAL LOW (ref 14.0–18.0)
Immature Granulocytes #: 0 K/uL
Lymphocytes %: 25.1 % (ref 20.0–40.0)
Lymphocytes Absolute: 1.4 K/uL (ref 1.1–4.5)
MCH: 27.7 pg (ref 27.0–31.0)
MCHC: 32.5 g/dL — ABNORMAL LOW (ref 33.0–37.0)
MCV: 85.1 fL (ref 80.0–94.0)
MPV: 9.5 fL (ref 9.4–12.4)
Monocytes %: 11.3 % — ABNORMAL HIGH (ref 0.0–10.0)
Monocytes Absolute: 0.6 K/uL (ref 0.00–0.90)
Neutrophils %: 57.7 % (ref 50.0–65.0)
Neutrophils Absolute: 3.3 K/uL (ref 1.5–7.5)
Platelets: 281 K/uL (ref 130–400)
RBC: 4.77 M/uL (ref 4.70–6.10)
RDW: 13.4 % (ref 11.5–14.5)
WBC: 5.7 K/uL (ref 4.8–10.8)

## 2024-05-19 MED ORDER — HYDROXYZINE HCL 10 MG PO TABS
10 | Freq: Three times a day (TID) | ORAL | Status: DC | PRN
Start: 2024-05-19 — End: 2024-05-23
  Administered 2024-05-19 – 2024-05-23 (×2): 10 mg via ORAL

## 2024-05-19 MED FILL — LISINOPRIL 10 MG PO TABS: 10 mg | ORAL | Qty: 1 | Fill #0

## 2024-05-19 MED FILL — HYDROXYZINE HCL 10 MG PO TABS: 10 mg | ORAL | Qty: 1 | Fill #0

## 2024-05-19 MED FILL — DONEPEZIL HCL 10 MG PO TABS: 10 mg | ORAL | Qty: 1 | Fill #0

## 2024-05-19 MED FILL — METOPROLOL TARTRATE 25 MG PO TABS: 25 mg | ORAL | Qty: 1 | Fill #0

## 2024-05-19 MED FILL — VANCOMYCIN HCL 10 G IV SOLR: 10 g | INTRAVENOUS | Qty: 1250 | Fill #0

## 2024-05-19 MED FILL — QUETIAPINE FUMARATE 50 MG PO TABS: 50 mg | ORAL | Qty: 1 | Fill #0

## 2024-05-19 MED FILL — DISULFIRAM 250 MG PO TABS: 250 mg | ORAL | Qty: 1 | Fill #0

## 2024-05-19 MED FILL — PANTOPRAZOLE SODIUM 20 MG PO TBEC: 20 mg | ORAL | Qty: 1 | Fill #0

## 2024-05-19 MED FILL — ATORVASTATIN CALCIUM 10 MG PO TABS: 10 mg | ORAL | Qty: 1 | Fill #0

## 2024-05-19 MED FILL — TAMSULOSIN HCL 0.4 MG PO CAPS: 0.4 mg | ORAL | Qty: 2 | Fill #0

## 2024-05-19 MED FILL — ASPIRIN 325 MG PO TABS: 325 mg | ORAL | Qty: 1 | Fill #0

## 2024-05-19 MED FILL — ENOXAPARIN SODIUM 40 MG/0.4ML IJ SOSY: 40 MG/0.4ML | INTRAMUSCULAR | Qty: 0.4 | Fill #0

## 2024-05-19 NOTE — Plan of Care (Signed)
"    Problem: Safety - Adult  Goal: Free from fall injury  Recent Flowsheet Documentation  Taken 05/19/2024 1110 by Black, Kristena, LPN  Free From Fall Injury: Instruct family/caregiver on patient safety     Problem: Safety - Adult  Goal: Free from fall injury  Recent Flowsheet Documentation  Taken 05/19/2024 1110 by Netta Abelson, LPN  Free From Fall Injury: Instruct family/caregiver on patient safety     Problem: Safety - Adult  Goal: Free from fall injury  05/19/2024 2126 by Jonne Raisin, RN  Outcome: Progressing  05/19/2024 1322 by Netta Abelson, LPN  Outcome: Progressing  Flowsheets (Taken 05/19/2024 1110)  Free From Fall Injury: Instruct family/caregiver on patient safety     Problem: Skin/Tissue Integrity - Adult  Goal: Skin integrity remains intact  Description: 1.  Monitor for areas of redness and/or skin breakdown  2.  Assess vascular access sites hourly  3.  Every 4-6 hours minimum:  Change oxygen saturation probe site  4.  Every 4-6 hours:  If on nasal continuous positive airway pressure, respiratory therapy assess nares and determine need for appliance change or resting period  Recent Flowsheet Documentation  Taken 05/19/2024 1110 by Netta Abelson, LPN  Skin Integrity Remains Intact: Monitor for areas of redness and/or skin breakdown     "

## 2024-05-19 NOTE — Progress Notes (Signed)
"  SLP Therapy  Attempted to see pt this AM. Pt was asleep upon SLP entry. Attempted to awaken pt, but he would not cooperate. Pt answered SLP, but he refused to participate in therapy. Pt turned his head toward his pillow and put his hand over his face. SLP offered him a sip of water , but he would not take a drink.  "

## 2024-05-19 NOTE — Progress Notes (Signed)
 "  [facility]      Patient:  Raymond Skinner  Date of Birth: 22-Jun-1955  Date of Service: 05/19/2024  MRN: [id number]   Acct: [id number]   Primary Care Physician: Joshua Dorothyann SAILOR, APRN  Advance Directive: Full Code  Admit Date: 05/14/2024       Hospital Day: 5  Portions of this note have been copied forward, however, changed to reflect the most current clinical status of this patient.  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  Patient was recently hospitalized on 12/4 due to rhabdomyolysis this improved with IVF, did have urinary retention and indwelling Foley catheter was placed.  Patient was discharged 12/8 to [facility] SNF facility.   Patient presented to [facility] ER due to altered mental status.  Patient noted to have UTI and had been placed on oral antibiotics at SNF facility.  Without improvement SNF sent patient to [facility] ER for further evaluation.  Workup in ER CT head no acute intracranial abnormality, CT right hip no acute fracture, XR left foot mild soft tissue swelling without acute fracture, urinalysis small LE, WBC 41.  Patient initiated on Rocephin  and was admitted to hospitalist service. Urine culture moderate growth Enterococcus faecalis initiated on IV vancomycin . Has had improvement of mentation. Self-removed indwelling catheter, recurrent retention. Indwelling catheter reinserted. Initiated on scheduled bowel regimen.     Review of Systems:   Review of Systems   Unable to perform ROS: Mental status change         Objective:   VITALS:  BP (!) 160/79   Pulse 100   Temp 97.7 F (36.5 C) (Temporal)   Resp 18   Ht 1.829 m (6' 0.01)   Wt 79 kg (174 lb 3.2 oz)   SpO2 (!) 83%   BMI 23.62 kg/m   24HR INTAKE/OUTPUT:    Intake/Output Summary (Last 24 hours)  at 05/19/2024 1438  Last data filed at 05/18/2024 1932  Gross per 24 hour   Intake --   Output 400 ml   Net -400 ml       Physical Exam  Vitals and nursing note reviewed.   Constitutional:       Appearance: Normal appearance.   HENT:      Mouth/Throat:      Mouth: Mucous membranes are moist.      Pharynx: Oropharynx is clear.   Eyes:      Extraocular Movements: Extraocular movements intact.      Conjunctiva/sclera: Conjunctivae normal.      Pupils: Pupils are equal, round, and reactive to light.   Cardiovascular:      Rate and Rhythm: Normal rate and regular rhythm.      Pulses: Normal pulses.      Heart sounds: Normal heart sounds. No murmur heard.  Pulmonary:      Effort: Pulmonary effort is normal. No respiratory distress.      Breath sounds: Normal breath sounds.   Abdominal:      General: Bowel sounds are normal. There is no distension.      Palpations: Abdomen is soft.      Tenderness: There is no abdominal tenderness. There is no guarding or rebound.   Musculoskeletal:         General: No swelling.  Normal range of motion.      Cervical back: Normal range of motion and neck supple. No rigidity or tenderness.      Right lower leg: No edema.      Left lower leg: No edema.   Skin:     General: Skin is warm and dry.      Capillary Refill: Capillary refill takes less than 2 seconds.      Comments: Pressure injury to bilateral ankles, see media   Neurological:      Mental Status: He is alert.      Cranial Nerves: No cranial nerve deficit, dysarthria or facial asymmetry.      Motor: Weakness (generalized) present.      Comments: Disoriented to  place, time, and situation   Able to follow simple commands   Pleasantly confused    Psychiatric:         Mood and Affect: Mood normal.         Behavior: Behavior normal.         Medications:      sodium chloride         metoprolol  tartrate  25 mg Oral BID    QUEtiapine   50 mg Oral Nightly    vancomycin   1,250 mg IntraVENous Q12H    vancomycin  (VANCOCIN ) intermittent dosing  (placeholder)   Other RX Placeholder    lisinopril   10 mg Oral Daily    aspirin   325 mg Oral Daily    disulfiram   250 mg Oral BID    donepezil   10 mg Oral Nightly    [Held by provider] memantine   10 mg Oral BID    pantoprazole   20 mg Oral QAM AC    atorvastatin   10 mg Oral Daily    tamsulosin   0.8 mg Oral Daily    sodium chloride  flush  5-40 mL IntraVENous 2 times per day    enoxaparin   40 mg SubCUTAneous Daily     hydrOXYzine  HCl, hydrALAZINE , sodium chloride  flush, sodium chloride , ondansetron  **OR** ondansetron , acetaminophen  **OR** acetaminophen , polyethylene glycol  ADULT DIET; Easy to Chew  ADULT ORAL NUTRITION SUPPLEMENT; Lunch, Dinner; Wound Healing Oral Supplement     Lab and other Data:     Recent Labs     05/17/24  0245 05/18/24  0110 05/19/24  0208   WBC 6.4 7.2 5.7   HGB 12.9* 13.2* 13.2*   PLT 291 303 281     Recent Labs     05/17/24  0245 05/18/24  0110 05/19/24  0208   NA 142 141 136   K 4.1 3.9 4.0   CL 104 104 103   CO2 25 23 24    BUN 11 13 17    CREATININE 0.8 0.8 0.7   GLUCOSE 117* 131* 123*     No results for input(s): AST, ALT, BILITOT, ALKPHOS in the last 72 hours.    Invalid input(s): ALB    Troponin T: No results for input(s): TROPONINI in the last 72 hours.  Pro-BNP: No results for input(s): BNP in the last 72 hours.  INR: No results for input(s): INR in the last 72 hours.  UA:  No results for input(s): NITRITE, COLORU, PHUR, LABCAST, WBCUA, RBCUA, MUCUS, TRICHOMONAS, YEAST, BACTERIA, CLARITYU, SPECGRAV, LEUKOCYTESUR, UROBILINOGEN, BILIRUBINUR, BLOODU, GLUCOSEU, AMORPHOUS in the last 72 hours.    Invalid input(s): KETONESU    A1C: No results for input(s): LABA1C in the last 72 hours.  ABG:No results for input(s): PHART, PCO2ART, PO2ART, HCO3ART, BEART, HGBAE, O2SATART, CARBOXHGBART  in the last 72 hours.    RAD:   XR FOOT LEFT (MIN 3 VIEWS)  Result Date: 05/16/2024   1.  Mild soft tissue swelling without visible acute  fracture. 2. Chronic findings as above.    ______________________________________ Electronically signed by: NORLEEN KURK M.D. Date:     05/16/2024 Time:    06:58     CT HIP RIGHT WO CONTRAST  Result Date: 05/14/2024   1. No acute fracture.   All CT scans are performed using dose optimization techniques as appropriate to the performed exam and includes at least one of the following:  Automated exposure control, adjustment of the mA and/or kV according to size, and the use of iterative reconstruction technique.  All CT scans are performed using dose optimization techniques as appropriate to the performed exam and include at least one of the following: Automated exposure control, adjustment of the mA and/or kV according to size, and the use of iterative reconstruction technique.  ______________________________________ Electronically signed by: GUSTAVO HIGHTOWER M.D. Date:     05/14/2024 Time:    17:50     XR HIP 2-3 VW W PELVIS RIGHT  Result Date: 05/14/2024   Hip arthroplasty on the right  Degenerative changes of the spine and pelvis.  No displaced fracture evident however there is a subtle degree of lucency in the medial border of the greater trochanter which is a change from prior.  If there is concern for occult fracture recommend CT.    ______________________________________ Electronically signed by: GUSTAVO HIGHTOWER M.D. Date:     05/14/2024 Time:    16:52     CT HEAD WO CONTRAST  Result Date: 05/14/2024  1.  No acute intracranial process. 2. 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  All CT scans are performed using dose optimization techniques as appropriate to the performed exam and include at least one of the following: Automated exposure control, adjustment of the mA and/or kV according to size, and the use of iterative reconstruction technique.  ______________________________________ Electronically signed by: GLENDIA BIRK M.D. Date:     05/14/2024 Time:    15:39      Assessment/Plan   Principal Problem:    UTI (urinary tract infection)   -Urinalysis & Culture    -IV antibiotic Vancomycin     -Monitor I&O  Active Problems    Dementia    Aricept , seroquel    Held namenda     Fall precautions   PT/OT/SLP    Alcohol abuse   Disulfiram    Hypertension    Lisinopril , lopressor  initiated   As needed anti-hypertensive      Hyperlipidemia   Statin therapy     Urinary retention   Indwelling catheter re-inserted    Flomax     Monitor I&O    Moderate malnutrition   Dieitican consulted   Constipation    Scheduled bowel regimen increased with success    DVT Prophylaxis: lovenox       Leontine CHRISTELLA Pinal, APRN - CNP, 05/19/2024 2:38 PM  "

## 2024-05-19 NOTE — Plan of Care (Signed)
 "  Problem: Safety - Adult  Goal: Free from fall injury  Recent Flowsheet Documentation  Taken 05/19/2024 1110 by Black, Kristena, LPN  Free From Fall Injury: Instruct family/caregiver on patient safety     Problem: Safety - Adult  Goal: Free from fall injury  Recent Flowsheet Documentation  Taken 05/19/2024 1110 by Netta Abelson, LPN  Free From Fall Injury: Instruct family/caregiver on patient safety     Problem: Safety - Adult  Goal: Free from fall injury  Outcome: Progressing  Flowsheets (Taken 05/19/2024 1110)  Free From Fall Injury: Instruct family/caregiver on patient safety     Problem: Skin/Tissue Integrity - Adult  Goal: Skin integrity remains intact  Description: 1.  Monitor for areas of redness and/or skin breakdown  2.  Assess vascular access sites hourly  3.  Every 4-6 hours minimum:  Change oxygen saturation probe site  4.  Every 4-6 hours:  If on nasal continuous positive airway pressure, respiratory therapy assess nares and determine need for appliance change or resting period  Recent Flowsheet Documentation  Taken 05/19/2024 1110 by Netta Abelson, LPN  Skin Integrity Remains Intact: Monitor for areas of redness and/or skin breakdown     Problem: Musculoskeletal - Adult  Goal: Return mobility to safest level of function  Outcome: Progressing  Goal: Maintain proper alignment of affected body part  Outcome: Progressing  Goal: Return ADL status to a safe level of function  Outcome: Progressing     Problem: Infection - Adult  Goal: Absence of infection at discharge  Outcome: Progressing  Goal: Absence of infection during hospitalization  Outcome: Progressing     Problem: Metabolic/Fluid and Electrolytes - Adult  Goal: Electrolytes maintained within normal limits  Outcome: Progressing     Problem: Skin/Tissue Integrity  Goal: Skin integrity remains intact  Description: 1.  Monitor for areas of redness and/or skin breakdown  2.  Assess vascular access sites hourly  3.  Every 4-6 hours minimum:  Change oxygen  saturation probe site  4.  Every 4-6 hours:  If on nasal continuous positive airway pressure, respiratory therapy assess nares and determine need for appliance change or resting period  Recent Flowsheet Documentation  Taken 05/19/2024 1110 by Netta Abelson, LPN  Skin Integrity Remains Intact: Monitor for areas of redness and/or skin breakdown     Problem: Skin/Tissue Integrity  Goal: Skin integrity remains intact  Description: 1.  Monitor for areas of redness and/or skin breakdown  Recent Flowsheet Documentation  Taken 05/19/2024 1110 by Black, Kristena, LPN  Skin Integrity Remains Intact: Monitor for areas of redness and/or skin breakdown     Problem: Skin/Tissue Integrity  Goal: Skin integrity remains intact  Description: 1.  Monitor for areas of redness and/or skin breakdown  2.  Assess vascular access sites hourly  3.  Every 4-6 hours minimum:  Change oxygen saturation probe site  4.  Every 4-6 hours:  If on nasal continuous positive airway pressure, respiratory therapy assess nares and determine need for appliance change or resting period  Outcome: Progressing  Flowsheets (Taken 05/19/2024 1110)  Skin Integrity Remains Intact: Monitor for areas of redness and/or skin breakdown     Problem: Nutrition Deficit:  Goal: Optimize nutritional status  Outcome: Progressing     Problem: ABCDS Injury Assessment  Goal: Absence of physical injury  Recent Flowsheet Documentation  Taken 05/19/2024 1110 by Netta Abelson, LPN  Absence of Physical Injury: Implement safety measures based on patient assessment     Problem: ABCDS Injury Assessment  Goal: Absence of physical injury  Outcome: Progressing  Flowsheets (Taken 05/19/2024 1110)  Absence of Physical Injury: Implement safety measures based on patient assessment     "

## 2024-05-20 LAB — CBC WITH AUTO DIFFERENTIAL
Basophils %: 1.3 % — ABNORMAL HIGH (ref 0.0–1.0)
Basophils Absolute: 0.1 K/uL (ref 0.00–0.20)
Eosinophils %: 5 % (ref 0.0–5.0)
Eosinophils Absolute: 0.3 K/uL (ref 0.00–0.60)
Hematocrit: 40.9 % — ABNORMAL LOW (ref 42.0–52.0)
Hemoglobin: 13.3 g/dL — ABNORMAL LOW (ref 14.0–18.0)
Immature Granulocytes #: 0 K/uL
Lymphocytes %: 29.3 % (ref 20.0–40.0)
Lymphocytes Absolute: 1.6 K/uL (ref 1.1–4.5)
MCH: 27.3 pg (ref 27.0–31.0)
MCHC: 32.5 g/dL — ABNORMAL LOW (ref 33.0–37.0)
MCV: 84 fL (ref 80.0–94.0)
MPV: 9.4 fL (ref 9.4–12.4)
Monocytes %: 10.1 % — ABNORMAL HIGH (ref 0.0–10.0)
Monocytes Absolute: 0.6 K/uL (ref 0.00–0.90)
Neutrophils %: 54.3 % (ref 50.0–65.0)
Neutrophils Absolute: 3 K/uL (ref 1.5–7.5)
Platelets: 272 K/uL (ref 130–400)
RBC: 4.87 M/uL (ref 4.70–6.10)
RDW: 13.2 % (ref 11.5–14.5)
WBC: 5.6 K/uL (ref 4.8–10.8)

## 2024-05-20 LAB — BASIC METABOLIC PANEL W/ REFLEX TO MG FOR LOW K
Anion Gap: 10 mmol/L (ref 8–16)
BUN: 16 mg/dL (ref 8–23)
CO2: 26 mmol/L (ref 22–29)
Calcium: 9.4 mg/dL (ref 8.8–10.2)
Chloride: 103 mmol/L (ref 98–107)
Creatinine: 0.8 mg/dL (ref 0.7–1.2)
Est, Glom Filt Rate: >90 (ref >60)
Glucose: 112 mg/dL — ABNORMAL HIGH (ref 70–99)
Potassium reflex Magnesium: 4.1 mmol/L (ref 3.5–5.0)
Sodium: 139 mmol/L (ref 136–145)

## 2024-05-20 MED ORDER — LISINOPRIL 20 MG PO TABS
20 | Freq: Every day | ORAL | Status: DC
Start: 2024-05-20 — End: 2024-05-23
  Administered 2024-05-21 – 2024-05-23 (×3): 20 mg via ORAL

## 2024-05-20 MED ORDER — METOPROLOL TARTRATE 25 MG PO TABS
25 | Freq: Once | ORAL | Status: AC
Start: 2024-05-20 — End: 2024-05-20
  Administered 2024-05-20: 16:00:00 25 mg via ORAL

## 2024-05-20 MED ORDER — LISINOPRIL 10 MG PO TABS
10 | Freq: Once | ORAL | Status: AC
Start: 2024-05-20 — End: 2024-05-20
  Administered 2024-05-20: 16:00:00 10 mg via ORAL

## 2024-05-20 MED ORDER — METOPROLOL TARTRATE 50 MG PO TABS
50 | Freq: Two times a day (BID) | ORAL | Status: DC
Start: 2024-05-20 — End: 2024-05-23
  Administered 2024-05-21 – 2024-05-23 (×5): 50 mg via ORAL

## 2024-05-20 MED FILL — METOPROLOL TARTRATE 25 MG PO TABS: 25 mg | ORAL | Qty: 1 | Fill #0

## 2024-05-20 MED FILL — ATORVASTATIN CALCIUM 10 MG PO TABS: 10 mg | ORAL | Qty: 1 | Fill #0

## 2024-05-20 MED FILL — ENOXAPARIN SODIUM 40 MG/0.4ML IJ SOSY: 40 MG/0.4ML | INTRAMUSCULAR | Qty: 0.4 | Fill #0

## 2024-05-20 MED FILL — TAMSULOSIN HCL 0.4 MG PO CAPS: 0.4 mg | ORAL | Qty: 2 | Fill #0

## 2024-05-20 MED FILL — QUETIAPINE FUMARATE 50 MG PO TABS: 50 mg | ORAL | Qty: 1 | Fill #0

## 2024-05-20 MED FILL — DONEPEZIL HCL 10 MG PO TABS: 10 mg | ORAL | Qty: 1 | Fill #0

## 2024-05-20 MED FILL — DISULFIRAM 250 MG PO TABS: 250 mg | ORAL | Qty: 1 | Fill #0

## 2024-05-20 MED FILL — ASPIRIN 325 MG PO TABS: 325 mg | ORAL | Qty: 1 | Fill #0

## 2024-05-20 MED FILL — PANTOPRAZOLE SODIUM 20 MG PO TBEC: 20 mg | ORAL | Qty: 1 | Fill #0

## 2024-05-20 MED FILL — VANCOMYCIN HCL 10 G IV SOLR: 10 g | INTRAVENOUS | Qty: 1250 | Fill #0

## 2024-05-20 MED FILL — LISINOPRIL 10 MG PO TABS: 10 mg | ORAL | Qty: 1 | Fill #0

## 2024-05-20 NOTE — Progress Notes (Signed)
"  Occupational Therapy  Name: THURL BOEN  MRN:  [id number]  Date of service:  05/20/2024       05/20/24 1130   Restrictions/Precautions   Restrictions/Precautions Fall Risk   Activity Level Up with Assist   Required Braces or Orthoses? 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   Cognition   Cognition Comment Pt has difficulty following simple commands. Pt requires consistent cues and redirection to attend to task.   ADL   Feeding Setup;Supervision;Minimal assistance;Moderate assistance;Verbal cueing;Increased time to complete   Grooming Setup;Maximum assistance;Verbal cueing   UE Bathing Setup;Maximum assistance;Verbal cueing   LE Bathing Setup;Maximum assistance;Verbal cueing   UE Dressing Setup;Maximum assistance;Verbal cueing   LE Dressing Setup;Maximum assistance;Verbal cueing   Putting On/Taking Off Footwear Setup;Maximum assistance;Verbal cueing   Toileting Maximum assistance;Dependent/Total   Functional Mobility Maximum assistance;Dependent/Total   Functional Mobility Skilled Clinical Factors x2   Additional Comments Greatly limited by cognition. Pt was able to partially stand at EOB with max Ax2 but could not come into full standing. Pt is unable to follow simple commands   Bed mobility   Supine to Sit Substantial/Maximal assistance;2 Person assistance   Sit to Supine Substantial/Maximal assistance;2 Person assistance   Scooting Substantial/Maximal assistance;2 Person assistance   Bed Mobility Comments Pt sat EOB x10 minutes and was able to wash his face with warm wash cloth with SBA. Pt required fluctuating SBA/Mod A to maintain sitting balance due to posterior lean and pushing against therapist when encouraging him to lean forward.   Short Term Goals   Short Term Goal 1 The patient will demo sufficient cognition to meaningfully participate in any simple self care tasks consistently   Short Term Goal 2 The patient will demo sufficient cognition to  stand/transfer with assist as needed   Activity Tolerance   Activity Tolerance Treatment limited secondary to decreased cognition   Assessment   Assessment Tx completed. Pt required max Ax2 for getting to EOB. Pt was able to sit EOB x10 minutes with SBA/Mod A due to posterior lean at times. Pt was unable to follow simple commands consistently. Pt required constant cues for attention to task and redirection. Pt was able to wash his face while sitting EOB. Pt performed partial stand with max Ax2 and was unable to come into full standing and unable to correct with cues due to cognition. Pt returned to bed and positioned for comfort.   Treatment Diagnosis UTI, AMS   Discharge Recommendations 24 hour supervision or assist;Patient would benefit from continued therapy after discharge   Occupational Therapy Plan   Times Per Week 3-5   Times Per Day Once a day   OT Plan of Care   Friday X   Safety Devices   Type of Devices Bed alarm in place;Call light within reach;Left in bed;Nurse notified   Time Code Minutes    Timed Code Treatment Minutes 25 Minutes         Electronically signed by Rosina Courts, OT on 05/20/2024 at 11:30 AM   "

## 2024-05-20 NOTE — Progress Notes (Signed)
 "  [facility]      Patient:  Raymond Skinner  Date of Birth: 11/28/1955  Date of Service: 05/20/2024  MRN: [id number]   Acct: [id number]   Primary Care Physician: Joshua Dorothyann SAILOR, APRN  Advance Directive: Full Code  Admit Date: 05/14/2024       Hospital Day: 6  Portions of this note have been copied forward, however, changed to reflect the most current clinical status of this patient.  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  Patient was recently hospitalized on 12/4 due to rhabdomyolysis this improved with IVF, did have urinary retention and indwelling Foley catheter was placed.  Patient was discharged 12/8 to [facility] SN[facility] facility.   Patient presented to [facility] ER due to altered mental status.  Patient noted to have UTI and had been placed on oral antibiotics at SNF facility.  Without improvement SNF sent patient to [facility] ER for further evaluation.  Workup in ER CT head no acute intracranial abnormality, CT right hip no acute fracture, XR left foot mild soft tissue swelling without acute fracture, urinalysis small LE, WBC 41.  Patient initiated on Rocephin  and was admitted to hospitalist service. Urine culture moderate growth Enterococcus faecalis initiated on IV vancomycin . Has had improvement of mentation. Self-removed indwelling catheter, recurrent retention. Indwelling catheter reinserted. Initiated on scheduled bowel regimen with results    Review of Systems:   Review of Systems   Unable to perform ROS: Mental status change         Objective:   VITALS:  BP (!) 162/98   Pulse 64   Temp 97.7 F (36.5 C) (Axillary)   Resp 18   Ht 1.829 m (6' 0.01)   Wt 79.8 kg (176 lb)   SpO2 97% Comment: Simultaneous filing. User may not have seen previous data.  BMI 23.86  kg/m   24HR INTAKE/OUTPUT:    Intake/Output Summary (Last 24 hours) at 05/20/2024 1436  Last data filed at 05/20/2024 1306  Gross per 24 hour   Intake 240 ml   Output 1625 ml   Net -1385 ml       Physical Exam  Vitals and nursing note reviewed.   Constitutional:       Appearance: Normal appearance.   HENT:      Mouth/Throat:      Mouth: Mucous membranes are moist.      Pharynx: Oropharynx is clear.   Eyes:      Extraocular Movements: Extraocular movements intact.      Conjunctiva/sclera: Conjunctivae normal.      Pupils: Pupils are equal, round, and reactive to light.   Cardiovascular:      Rate and Rhythm: Normal rate and regular rhythm.      Pulses: Normal pulses.      Heart sounds: Normal heart sounds. No murmur heard.  Pulmonary:      Effort: Pulmonary effort is normal. No respiratory distress.      Breath sounds: Normal breath sounds.   Abdominal:      General: Bowel sounds are normal. There is no distension.      Palpations: Abdomen is soft.      Tenderness: There is no abdominal tenderness. There is no guarding or rebound.   Musculoskeletal:  General: No swelling. Normal range of motion.      Cervical back: Normal range of motion and neck supple. No rigidity or tenderness.      Right lower leg: No edema.      Left lower leg: No edema.   Skin:     General: Skin is warm and dry.      Capillary Refill: Capillary refill takes less than 2 seconds.      Comments: Pressure injury to bilateral ankles, see media   Neurological:      Mental Status: He is alert.      Cranial Nerves: No cranial nerve deficit, dysarthria or facial asymmetry.      Motor: Weakness (generalized) present.      Comments: Disoriented to  place, time, and situation   Able to follow simple commands   Pleasantly confused    Psychiatric:         Mood and Affect: Mood normal.         Behavior: Behavior normal.         Medications:      sodium chloride         metoprolol  tartrate  50 mg Oral BID    [START ON 05/21/2024] lisinopril   20 mg Oral Daily     QUEtiapine   50 mg Oral Nightly    vancomycin   1,250 mg IntraVENous Q12H    vancomycin  (VANCOCIN ) intermittent dosing (placeholder)   Other RX Placeholder    aspirin   325 mg Oral Daily    disulfiram   250 mg Oral BID    donepezil   10 mg Oral Nightly    [Held by provider] memantine   10 mg Oral BID    pantoprazole   20 mg Oral QAM AC    atorvastatin   10 mg Oral Daily    tamsulosin   0.8 mg Oral Daily    sodium chloride  flush  5-40 mL IntraVENous 2 times per day    enoxaparin   40 mg SubCUTAneous Daily     hydrOXYzine  HCl, hydrALAZINE , sodium chloride  flush, sodium chloride , ondansetron  **OR** ondansetron , acetaminophen  **OR** acetaminophen , polyethylene glycol  ADULT DIET; Easy to Chew  ADULT ORAL NUTRITION SUPPLEMENT; Lunch, Dinner; Wound Healing Oral Supplement     Lab and other Data:     Recent Labs     05/18/24  0110 05/19/24  0208 05/20/24  0257   WBC 7.2 5.7 5.6   HGB 13.2* 13.2* 13.3*   PLT 303 281 272     Recent Labs     05/18/24  0110 05/19/24  0208 05/20/24  0257   NA 141 136 139   K 3.9 4.0 4.1   CL 104 103 103   CO2 23 24 26    BUN 13 17 16    CREATININE 0.8 0.7 0.8   GLUCOSE 131* 123* 112*     No results for input(s): AST, ALT, BILITOT, ALKPHOS in the last 72 hours.    Invalid input(s): ALB    Troponin T: No results for input(s): TROPONINI in the last 72 hours.  Pro-BNP: No results for input(s): BNP in the last 72 hours.  INR: No results for input(s): INR in the last 72 hours.  UA:  No results for input(s): NITRITE, COLORU, PHUR, LABCAST, WBCUA, RBCUA, MUCUS, TRICHOMONAS, YEAST, BACTERIA, CLARITYU, SPECGRAV, LEUKOCYTESUR, UROBILINOGEN, BILIRUBINUR, BLOODU, GLUCOSEU, AMORPHOUS in the last 72 hours.    Invalid input(s): KETONESU    A1C: No results for input(s): LABA1C in the last 72 hours.  ABG:No results for input(s): PHART, PCO2ART,  PO2ART, HCO3ART, BEART, HGBAE, O2SATART, CARBOXHGBART in the last 72 hours.    RAD:   XR FOOT LEFT (MIN 3  VIEWS)  Result Date: 05/16/2024   1.  Mild soft tissue swelling without visible acute fracture. 2. Chronic findings as above.    ______________________________________ Electronically signed by: NORLEEN KURK M.D. Date:     05/16/2024 Time:    06:58     CT HIP RIGHT WO CONTRAST  Result Date: 05/14/2024   1. No acute fracture.   All CT scans are performed using dose optimization techniques as appropriate to the performed exam and includes at least one of the following:  Automated exposure control, adjustment of the mA and/or kV according to size, and the use of iterative reconstruction technique.  All CT scans are performed using dose optimization techniques as appropriate to the performed exam and include at least one of the following: Automated exposure control, adjustment of the mA and/or kV according to size, and the use of iterative reconstruction technique.  ______________________________________ Electronically signed by: GUSTAVO HIGHTOWER M.D. Date:     05/14/2024 Time:    17:50     XR HIP 2-3 VW W PELVIS RIGHT  Result Date: 05/14/2024   Hip arthroplasty on the right  Degenerative changes of the spine and pelvis.  No displaced fracture evident however there is a subtle degree of lucency in the medial border of the greater trochanter which is a change from prior.  If there is concern for occult fracture recommend CT.    ______________________________________ Electronically signed by: GUSTAVO HIGHTOWER M.D. Date:     05/14/2024 Time:    16:52     CT HEAD WO CONTRAST  Result Date: 05/14/2024  1.  No acute intracranial process. 2. 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  All CT scans are performed using dose optimization techniques as appropriate to the performed exam and include at least one of the following: Automated exposure control, adjustment of the mA and/or kV according to size, and the use of iterative reconstruction technique.  ______________________________________  Electronically signed by: GLENDIA BIRK M.D. Date:     05/14/2024 Time:    15:39     Assessment/Plan   Principal Problem:    UTI (urinary tract infection)   -Urinalysis & Culture    -IV antibiotic Vancomycin     -Monitor I&O  Active Problems    Dementia    Aricept , seroquel    Held namenda     Fall precautions   PT/OT/SLP    Alcohol abuse   Disulfiram    Hypertension    142/78   Increased Lisinopril , lopressor    As needed anti-hypertensive      Hyperlipidemia   Statin therapy     Urinary retention   Indwelling catheter re-inserted    Flomax     Monitor I&O    Moderate malnutrition   Dieitican consulted   Constipation    Scheduled bowel regimen increased with success    DVT Prophylaxis: lovenox     Social worker following pre-cert initiated       Leontine CHRISTELLA Pinal, APRN - CNP, 05/20/2024 2:36 PM  "

## 2024-05-20 NOTE — Plan of Care (Signed)
"    Problem: Safety - Adult  Goal: Free from fall injury  05/20/2024 1015 by Saunders Perkins, RN  Outcome: Progressing  05/19/2024 2126 by Jonne Raisin, RN  Outcome: Progressing     Problem: Musculoskeletal - Adult  Goal: Return mobility to safest level of function  05/20/2024 1015 by Saunders Perkins, RN  Outcome: Progressing  05/19/2024 2126 by Jonne Raisin, RN  Outcome: Progressing  Goal: Maintain proper alignment of affected body part  05/20/2024 1015 by Saunders Perkins, RN  Outcome: Progressing  05/19/2024 2126 by Jonne Raisin, RN  Outcome: Progressing  Goal: Return ADL status to a safe level of function  05/20/2024 1015 by Saunders Perkins, RN  Outcome: Progressing  05/19/2024 2126 by Jonne Raisin, RN  Outcome: Progressing     Problem: Infection - Adult  Goal: Absence of infection at discharge  05/20/2024 1015 by Saunders Perkins, RN  Outcome: Progressing  05/19/2024 2126 by Jonne Raisin, RN  Outcome: Progressing  Goal: Absence of infection during hospitalization  05/20/2024 1015 by Saunders Perkins, RN  Outcome: Progressing  05/19/2024 2126 by Jonne Raisin, RN  Outcome: Progressing     Problem: Metabolic/Fluid and Electrolytes - Adult  Goal: Electrolytes maintained within normal limits  05/20/2024 1015 by Saunders Perkins, RN  Outcome: Progressing  05/19/2024 2126 by Jonne Raisin, RN  Outcome: Progressing     Problem: Skin/Tissue Integrity  Goal: Skin integrity remains intact  Description: 1.  Monitor for areas of redness and/or skin breakdown  2.  Assess vascular access sites hourly  3.  Every 4-6 hours minimum:  Change oxygen saturation probe site  4.  Every 4-6 hours:  If on nasal continuous positive airway pressure, respiratory therapy assess nares and determine need for appliance change or resting period  05/20/2024 1015 by Saunders Perkins, RN  Outcome: Progressing  05/19/2024 2126 by Jonne Raisin, RN  Outcome: Progressing     Problem: Nutrition Deficit:  Goal: Optimize nutritional status  05/20/2024 1015 by Saunders Perkins,  RN  Outcome: Progressing  05/19/2024 2126 by Jonne Raisin, RN  Outcome: Progressing     Problem: ABCDS Injury Assessment  Goal: Absence of physical injury  05/20/2024 1015 by Saunders Perkins, RN  Outcome: Progressing  05/19/2024 2126 by Jonne Raisin, RN  Outcome: Progressing     "

## 2024-05-20 NOTE — Progress Notes (Signed)
"  Physical Therapy  Name: RAMBO SARAFIAN  MRN:  [id number]  Date of service:  05/20/2024       05/20/24 1136   Restrictions/Precautions   Restrictions/Precautions Fall Risk   Activity Level Up with Assist   Required Braces or Orthoses? 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   General   Additional Pertinent Hx R hip fx, THA 01/14/24; recent hospital admit   Referral Date  05/15/24   Diagnosis disorientation, UTI, hematuria, falls   Follows Commands X   Other (Comment) limited ability to follow commands   Subjective   Subjective Pt in bed, talking but not making sense   Vitals   O2 Device None (Room air)   Cognition   Following Commands Impaired;Inconsistently follows commands   Cognition Comment Pt has difficulty following simple commands. Pt requires consistent cues and redirection to attend to task.   Bed mobility   Supine to Sit 2 Person assistance;Substantial/Maximal assistance   Sit to Supine 2 Person assistance;Substantial/Maximal assistance   Scooting 2 Person assistance;Substantial/Maximal assistance   Bed Mobility Comments Pt sat EOB x10 minutes and was able to wash his face with warm wash cloth with SBA. Pt required fluctuating SBA/Mod A to maintain sitting balance due to posterior lean and pushing against therapist when encouraging him to lean forward.   Transfers   Sit to Stand Unable to assess   Stand to Sit Unable to assess   Short Term Goals   Time Frame for Short Term Goals 2 wks   Short Term Goal 1 supine to sit Min A   Short Term Goal 2 sit to stand Min A   Short Term Goal 3 bed to chair Min A   Short Term Goal 4 amb. 70' with RW CGA   Activity Tolerance   Activity Tolerance Treatment limited secondary to decreased cognition   Assessment   Assessment pt with limited tolerance due to cognition  Pt with A x 2 to perform bed mob and sit eob   pt needing max support and constant cueing to maintain sitting posture eob   Therapist attempted sit  stand with pt with A x 2 , however pt unable to comprehend and pt never coming to stand   Treatment Diagnosis impaired gait and mobility   Barriers to Learning cognition   Discharge Recommendations Continue to assess pending progress;Subacute/Skilled Nursing Facility;Patient would benefit from continued therapy after discharge;Therapy recommended at discharge   Physical Therapy Plan   General Plan 5-7 times per week   Therapy Duration 2 Weeks   Current Treatment Recommendations Strengthening;ROM;Functional mobility training;Balance training;Transfer training;Gait training;Endurance training;Equipment evaluation, education, & procurement;Patient/Caregiver education & training;Therapeutic activities;Safety education & training;Positioning   PT Plan of Care   Friday X   Safety Devices   Type of Devices Bed alarm in place;Call light within reach;Left in bed         Electronically signed by Leita JONETTA Masker, PTA on 05/20/2024 at 3:49 PM    "

## 2024-05-20 NOTE — Progress Notes (Signed)
"  Nutrition Assessment     Type and Reason for Visit: Reassess    Nutrition Recommendations/Plan:   Continue current nutritional interventions     Malnutrition Assessment:  Malnutrition Status: Moderate malnutrition    Nutrition Assessment:  Patient continues to receive an Easy to chew diet with Juven in pudding at [facility].  PO intake continues to be vairable with intake ranging 0-75%. Intake usually 25% or slightly better.  Weight remains stable.    Estimated Daily Nutrient Needs:  Energy (kcal):  8004-7605 kcals (25-30 kcals/kg) Weight Used for Energy Requirements: Current     Protein (g):  80-160 g Weight Used for Protein Requirements: Current        Fluid (ml/day):  1995-2394 ml Method Used for Fluid Requirements: 1 ml/kcal    Nutrition Related Findings:   Glucose 112-123 Wound Type: Unstageable, Multiple, Traumatic Wounds    Current Nutrition Therapies:    ADULT DIET; Easy to Chew  ADULT ORAL NUTRITION SUPPLEMENT; Lunch, Dinner; Wound Healing Oral Supplement    Anthropometric Measures:  Height: 182.9 cm (6' 0.01)  Current Body Wt: 79.8 kg (175 lb 14.8 oz)   BMI: 23.9        Nutrition Diagnosis:   Inadequate protein-energy intake related to increase demand for energy/nutrients as evidenced by wounds, intake 0-25%, intake 26-50%, intake 51-75%    Nutrition Interventions:   Food and/or Nutrient Delivery: Continue Current Diet, Continue Oral Nutrition Supplement  Nutrition Education/Counseling: No recommendation at this time  Coordination of Nutrition Care: Continue to monitor while inpatient  Plan of Care discussed with: Pt    Goals:  Goals: Meet at least 75% of estimated needs, PO intake 50% or greater, Maintain adequate nutrition status  Type of Goal: Continue current goal  Previous Goal Met: Progressing toward Goal(s)    Nutrition Monitoring and Evaluation:   Behavioral-Environmental Outcomes: None Identified  Food/Nutrient Intake Outcomes: Food and Nutrient Intake, Supplement Intake  Physical Signs/Symptoms  Outcomes: Biochemical Data, Fluid Status or Edema, Weight, Skin, Nutrition Focused Physical Findings    Discharge Planning:    Continue current diet, Continue Oral Nutrition Supplement     Allanah Mcfarland C Danaiya Steadman, MS, RD, LD  Contact: [phone]    "

## 2024-05-20 NOTE — Care Coordination (Addendum)
"  Pre-cert is still pending at this time per [employer] portal for [facility]. Sw uploaded new notes to portal.   Electronically signed by MARDI CHIME, MSW on 05/20/2024 at 8:39 AM    "

## 2024-05-21 LAB — BASIC METABOLIC PANEL W/ REFLEX TO MG FOR LOW K
Anion Gap: 16 mmol/L (ref 8–16)
BUN: 19 mg/dL (ref 8–23)
CO2: 20 mmol/L — ABNORMAL LOW (ref 22–29)
Calcium: 9.2 mg/dL (ref 8.8–10.2)
Chloride: 105 mmol/L (ref 98–107)
Creatinine: 0.7 mg/dL (ref 0.7–1.2)
Est, Glom Filt Rate: >90 (ref >60)
Glucose: 135 mg/dL — ABNORMAL HIGH (ref 70–99)
Potassium reflex Magnesium: 4 mmol/L (ref 3.5–5.0)
Sodium: 141 mmol/L (ref 136–145)

## 2024-05-21 LAB — CBC WITH AUTO DIFFERENTIAL
Basophils %: 1.4 % — ABNORMAL HIGH (ref 0.0–1.0)
Basophils Absolute: 0.1 K/uL (ref 0.00–0.20)
Eosinophils %: 6.1 % — ABNORMAL HIGH (ref 0.0–5.0)
Eosinophils Absolute: 0.3 K/uL (ref 0.00–0.60)
Hematocrit: 40.5 % — ABNORMAL LOW (ref 42.0–52.0)
Hemoglobin: 13.1 g/dL — ABNORMAL LOW (ref 14.0–18.0)
Immature Granulocytes #: 0 K/uL
Lymphocytes %: 33.1 % (ref 20.0–40.0)
Lymphocytes Absolute: 1.7 K/uL (ref 1.1–4.5)
MCH: 27.2 pg (ref 27.0–31.0)
MCHC: 32.3 g/dL — ABNORMAL LOW (ref 33.0–37.0)
MCV: 84 fL (ref 80.0–94.0)
MPV: 9.4 fL (ref 9.4–12.4)
Monocytes %: 8.9 % (ref 0.0–10.0)
Monocytes Absolute: 0.5 K/uL (ref 0.00–0.90)
Neutrophils %: 50.3 % (ref 50.0–65.0)
Neutrophils Absolute: 2.6 K/uL (ref 1.5–7.5)
Platelets: 245 K/uL (ref 130–400)
RBC: 4.82 M/uL (ref 4.70–6.10)
RDW: 13.2 % (ref 11.5–14.5)
WBC: 5.1 K/uL (ref 4.8–10.8)

## 2024-05-21 LAB — MISCELLANEOUS SENDOUT

## 2024-05-21 MED FILL — VANCOMYCIN HCL 10 G IV SOLR: 10 g | INTRAVENOUS | Qty: 1250 | Fill #0

## 2024-05-21 MED FILL — DONEPEZIL HCL 10 MG PO TABS: 10 mg | ORAL | Qty: 1 | Fill #0

## 2024-05-21 MED FILL — QUETIAPINE FUMARATE 50 MG PO TABS: 50 mg | ORAL | Qty: 1 | Fill #0

## 2024-05-21 MED FILL — DISULFIRAM 250 MG PO TABS: 250 mg | ORAL | Qty: 1 | Fill #0

## 2024-05-21 MED FILL — METOPROLOL TARTRATE 50 MG PO TABS: 50 mg | ORAL | Qty: 1 | Fill #0

## 2024-05-21 MED FILL — ENOXAPARIN SODIUM 40 MG/0.4ML IJ SOSY: 40 MG/0.4ML | INTRAMUSCULAR | Qty: 0.4 | Fill #0

## 2024-05-21 MED FILL — PANTOPRAZOLE SODIUM 20 MG PO TBEC: 20 mg | ORAL | Qty: 1 | Fill #0

## 2024-05-21 MED FILL — ATORVASTATIN CALCIUM 10 MG PO TABS: 10 mg | ORAL | Qty: 1 | Fill #0

## 2024-05-21 MED FILL — LISINOPRIL 20 MG PO TABS: 20 mg | ORAL | Qty: 1 | Fill #0

## 2024-05-21 MED FILL — ASPIRIN 325 MG PO TABS: 325 mg | ORAL | Qty: 1 | Fill #0

## 2024-05-21 MED FILL — TAMSULOSIN HCL 0.4 MG PO CAPS: 0.4 mg | ORAL | Qty: 2 | Fill #0

## 2024-05-21 NOTE — Plan of Care (Signed)
"    Problem: Safety - Adult  Goal: Free from fall injury  Outcome: Progressing     Problem: Musculoskeletal - Adult  Goal: Return mobility to safest level of function  Outcome: Progressing  Goal: Maintain proper alignment of affected body part  Outcome: Progressing  Goal: Return ADL status to a safe level of function  Outcome: Progressing     Problem: Infection - Adult  Goal: Absence of infection at discharge  Outcome: Progressing  Goal: Absence of infection during hospitalization  Outcome: Progressing     Problem: Metabolic/Fluid and Electrolytes - Adult  Goal: Electrolytes maintained within normal limits  Outcome: Progressing     Problem: Skin/Tissue Integrity  Goal: Skin integrity remains intact  Description: 1.  Monitor for areas of redness and/or skin breakdown  2.  Assess vascular access sites hourly  3.  Every 4-6 hours minimum:  Change oxygen saturation probe site  4.  Every 4-6 hours:  If on nasal continuous positive airway pressure, respiratory therapy assess nares and determine need for appliance change or resting period  Outcome: Progressing     Problem: Nutrition Deficit:  Goal: Optimize nutritional status  Outcome: Progressing     Problem: ABCDS Injury Assessment  Goal: Absence of physical injury  Outcome: Progressing     "

## 2024-05-21 NOTE — Progress Notes (Signed)
 "  [facility]      Patient:  Raymond Skinner  Date of Birth: 05-10-1956  Date of Service: 05/21/2024  MRN: [id number]   Acct: [id number]   Primary Care Physician: Joshua Dorothyann SAILOR, APRN  Advance Directive: Full Code  Admit Date: 05/14/2024       Hospital Day: 7  Portions of this note have been copied forward, however, changed to reflect the most current clinical status of this patient.  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  Patient was recently hospitalized on 12/4 due to rhabdomyolysis this improved with IVF, did have urinary retention and indwelling Foley catheter was placed.  Patient was discharged 12/8 to [facility] SNF facility.   Patient presented to [facility] ER due to altered mental status.  Patient noted to have UTI and had been placed on oral antibiotics at SNF facility.  Without improvement SNF sent patient to [facility] ER for further evaluation.  Workup in ER CT head no acute intracranial abnormality, CT right hip no acute fracture, XR left foot mild soft tissue swelling without acute fracture, urinalysis small LE, WBC 41.  Patient initiated on Rocephin  and was admitted to hospitalist service. Urine culture moderate growth Enterococcus faecalis initiated on IV vancomycin . Has had improvement of mentation. Self-removed indwelling catheter, recurrent retention. Indwelling catheter reinserted. Initiated on scheduled bowel regimen with results. Able to state name upon exam. Awaiting pre-cert to return.    Review of Systems:   Review of Systems   Unable to perform ROS: Mental status change         Objective:   VITALS:  BP 124/68   Pulse 54   Temp 97.3 F (36.3 C) (Temporal)   Resp 16   Ht 1.829 m (6' 0.01)   Wt 79.8 kg (176 lb)   SpO2 100%   BMI 23.86 kg/m   24HR INTAKE/OUTPUT:     Intake/Output Summary (Last 24 hours) at 05/21/2024 1517  Last data filed at 05/21/2024 0917  Gross per 24 hour   Intake 1184.41 ml   Output 1050 ml   Net 134.41 ml       Physical Exam  Vitals and nursing note reviewed.   Constitutional:       Appearance: Normal appearance.   HENT:      Mouth/Throat:      Mouth: Mucous membranes are moist.      Pharynx: Oropharynx is clear.   Eyes:      Extraocular Movements: Extraocular movements intact.      Conjunctiva/sclera: Conjunctivae normal.      Pupils: Pupils are equal, round, and reactive to light.   Cardiovascular:      Rate and Rhythm: Normal rate and regular rhythm.      Pulses: Normal pulses.      Heart sounds: Normal heart sounds. No murmur heard.  Pulmonary:      Effort: Pulmonary effort is normal. No respiratory distress.      Breath sounds: Normal breath sounds.   Abdominal:      General: Bowel sounds are normal. There is no distension.      Palpations: Abdomen is soft.      Tenderness: There is no abdominal tenderness. There is no guarding or rebound.   Musculoskeletal:  General: No swelling. Normal range of motion.      Cervical back: Normal range of motion and neck supple. No rigidity or tenderness.      Right lower leg: No edema.      Left lower leg: No edema.   Skin:     General: Skin is warm and dry.      Capillary Refill: Capillary refill takes less than 2 seconds.      Comments: Pressure injury to bilateral ankles, see media   Neurological:      Mental Status: He is alert.      Cranial Nerves: No cranial nerve deficit, dysarthria or facial asymmetry.      Motor: Weakness (generalized) present.      Comments: Disoriented to  place, time, and situation   Able to follow simple commands   Pleasantly confused    Psychiatric:         Mood and Affect: Mood normal.         Behavior: Behavior normal.         Medications:      sodium chloride         metoprolol  tartrate  50 mg Oral BID    lisinopril   20 mg Oral Daily    QUEtiapine   50 mg Oral Nightly     vancomycin   1,250 mg IntraVENous Q12H    vancomycin  (VANCOCIN ) intermittent dosing (placeholder)   Other RX Placeholder    aspirin   325 mg Oral Daily    disulfiram   250 mg Oral BID    donepezil   10 mg Oral Nightly    [Held by provider] memantine   10 mg Oral BID    pantoprazole   20 mg Oral QAM AC    atorvastatin   10 mg Oral Daily    tamsulosin   0.8 mg Oral Daily    sodium chloride  flush  5-40 mL IntraVENous 2 times per day    enoxaparin   40 mg SubCUTAneous Daily     hydrOXYzine  HCl, hydrALAZINE , sodium chloride  flush, sodium chloride , ondansetron  **OR** ondansetron , acetaminophen  **OR** acetaminophen , polyethylene glycol  ADULT DIET; Easy to Chew  ADULT ORAL NUTRITION SUPPLEMENT; Lunch, Dinner; Wound Healing Oral Supplement     Lab and other Data:     Recent Labs     05/19/24  0208 05/20/24  0257 05/21/24  0228   WBC 5.7 5.6 5.1   HGB 13.2* 13.3* 13.1*   PLT 281 272 245     Recent Labs     05/19/24  0208 05/20/24  0257 05/21/24  0228   NA 136 139 141   K 4.0 4.1 4.0   CL 103 103 105   CO2 24 26 20*   BUN 17 16 19    CREATININE 0.7 0.8 0.7   GLUCOSE 123* 112* 135*     No results for input(s): AST, ALT, BILITOT, ALKPHOS in the last 72 hours.    Invalid input(s): ALB    Troponin T: No results for input(s): TROPONINI in the last 72 hours.  Pro-BNP: No results for input(s): BNP in the last 72 hours.  INR: No results for input(s): INR in the last 72 hours.  UA:  No results for input(s): NITRITE, COLORU, PHUR, LABCAST, WBCUA, RBCUA, MUCUS, TRICHOMONAS, YEAST, BACTERIA, CLARITYU, SPECGRAV, LEUKOCYTESUR, UROBILINOGEN, BILIRUBINUR, BLOODU, GLUCOSEU, AMORPHOUS in the last 72 hours.    Invalid input(s): KETONESU    A1C: No results for input(s): LABA1C in the last 72 hours.  ABG:No results for input(s): PHART, PCO2ART, PO2ART, HCO3ART, BEART,  HGBAE, O2SATART, CARBOXHGBART in the last 72 hours.    RAD:   XR FOOT LEFT (MIN 3 VIEWS)  Result Date: 05/16/2024   1.   Mild soft tissue swelling without visible acute fracture. 2. Chronic findings as above.    ______________________________________ Electronically signed by: NORLEEN KURK M.D. Date:     05/16/2024 Time:    06:58     CT HIP RIGHT WO CONTRAST  Result Date: 05/14/2024   1. No acute fracture.   All CT scans are performed using dose optimization techniques as appropriate to the performed exam and includes at least one of the following:  Automated exposure control, adjustment of the mA and/or kV according to size, and the use of iterative reconstruction technique.  All CT scans are performed using dose optimization techniques as appropriate to the performed exam and include at least one of the following: Automated exposure control, adjustment of the mA and/or kV according to size, and the use of iterative reconstruction technique.  ______________________________________ Electronically signed by: GUSTAVO HIGHTOWER M.D. Date:     05/14/2024 Time:    17:50     XR HIP 2-3 VW W PELVIS RIGHT  Result Date: 05/14/2024   Hip arthroplasty on the right  Degenerative changes of the spine and pelvis.  No displaced fracture evident however there is a subtle degree of lucency in the medial border of the greater trochanter which is a change from prior.  If there is concern for occult fracture recommend CT.    ______________________________________ Electronically signed by: GUSTAVO HIGHTOWER M.D. Date:     05/14/2024 Time:    16:52     CT HEAD WO CONTRAST  Result Date: 05/14/2024  1.  No acute intracranial process. 2. 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  All CT scans are performed using dose optimization techniques as appropriate to the performed exam and include at least one of the following: Automated exposure control, adjustment of the mA and/or kV according to size, and the use of iterative reconstruction technique.  ______________________________________ Electronically signed by: GLENDIA BIRK M.D.  Date:     05/14/2024 Time:    15:39     Assessment/Plan   Principal Problem:    UTI (urinary tract infection)   -Urinalysis & Culture    Culture Corynebacterium striatum, Enterococcus faecalis   -IV antibiotic Vancomycin     -Monitor I&O  Active Problems    Dementia    Aricept , seroquel    Held namenda     Fall precautions   PT/OT/SLP    Alcohol abuse   Disulfiram    Hypertension    124/68    Lisinopril , lopressor    As needed anti-hypertensive      Hyperlipidemia   Statin therapy     Urinary retention   Indwelling catheter re-inserted    Flomax     Monitor I&O    Moderate malnutrition   Dieitican consulted   Constipation    Scheduled bowel regimen increased with success    DVT Prophylaxis: lovenox     Social worker following pre-cert initiated       Leontine CHRISTELLA Pinal, APRN - CNP, 05/21/2024 3:17 PM  "

## 2024-05-21 NOTE — Plan of Care (Signed)
"    Problem: Safety - Adult  Goal: Free from fall injury  05/21/2024 1118 by Deloras Reichard, RN  Outcome: Progressing  05/21/2024 0626 by Vicci Landsberg, RN  Outcome: Progressing     Problem: Musculoskeletal - Adult  Goal: Return mobility to safest level of function  05/21/2024 1118 by Walter Grima, RN  Outcome: Progressing  05/21/2024 0626 by Vicci Landsberg, RN  Outcome: Progressing  Goal: Maintain proper alignment of affected body part  05/21/2024 1118 by Jerek Meulemans, RN  Outcome: Progressing  05/21/2024 0626 by Vicci Landsberg, RN  Outcome: Progressing  Goal: Return ADL status to a safe level of function  05/21/2024 1118 by Ayda Tancredi, RN  Outcome: Progressing  05/21/2024 0626 by Vicci Landsberg, RN  Outcome: Progressing     Problem: Infection - Adult  Goal: Absence of infection at discharge  05/21/2024 1118 by Brittain Smithey, RN  Outcome: Progressing  05/21/2024 0626 by Vicci Landsberg, RN  Outcome: Progressing  Goal: Absence of infection during hospitalization  05/21/2024 1118 by Koryn Charlot, RN  Outcome: Progressing  05/21/2024 0626 by Vicci Landsberg, RN  Outcome: Progressing     "

## 2024-05-22 LAB — CBC WITH AUTO DIFFERENTIAL
Basophils %: 1.1 % — ABNORMAL HIGH (ref 0.0–1.0)
Basophils Absolute: 0.1 K/uL (ref 0.00–0.20)
Eosinophils %: 3.8 % (ref 0.0–5.0)
Eosinophils Absolute: 0.2 K/uL (ref 0.00–0.60)
Hematocrit: 38.2 % — ABNORMAL LOW (ref 42.0–52.0)
Hemoglobin: 13.1 g/dL — ABNORMAL LOW (ref 14.0–18.0)
Immature Granulocytes #: 0 K/uL
Lymphocytes %: 33.4 % (ref 20.0–40.0)
Lymphocytes Absolute: 1.8 K/uL (ref 1.1–4.5)
MCH: 28.5 pg (ref 27.0–31.0)
MCHC: 34.3 g/dL (ref 33.0–37.0)
MCV: 83.2 fL (ref 80.0–94.0)
MPV: 9.5 fL (ref 9.4–12.4)
Monocytes %: 9.1 % (ref 0.0–10.0)
Monocytes Absolute: 0.5 K/uL (ref 0.00–0.90)
Neutrophils %: 52.4 % (ref 50.0–65.0)
Neutrophils Absolute: 2.9 K/uL (ref 1.5–7.5)
Platelets: 234 K/uL (ref 130–400)
RBC: 4.59 M/uL — ABNORMAL LOW (ref 4.70–6.10)
RDW: 13.2 % (ref 11.5–14.5)
WBC: 5.5 K/uL (ref 4.8–10.8)

## 2024-05-22 LAB — BASIC METABOLIC PANEL W/ REFLEX TO MG FOR LOW K
Anion Gap: 10 mmol/L (ref 8–16)
BUN: 16 mg/dL (ref 8–23)
CO2: 26 mmol/L (ref 22–29)
Calcium: 9.1 mg/dL (ref 8.8–10.2)
Chloride: 104 mmol/L (ref 98–107)
Creatinine: 0.7 mg/dL (ref 0.7–1.2)
Est, Glom Filt Rate: >90 (ref >60)
Glucose: 140 mg/dL — ABNORMAL HIGH (ref 70–99)
Potassium reflex Magnesium: 3.6 mmol/L (ref 3.5–5.0)
Sodium: 140 mmol/L (ref 136–145)

## 2024-05-22 MED ORDER — SODIUM CHLORIDE 0.9 % IV SOLN
0.9 | INTRAVENOUS | Status: DC
Start: 2024-05-22 — End: 2024-05-23
  Administered 2024-05-22: 20:00:00 via INTRAVENOUS

## 2024-05-22 MED FILL — LISINOPRIL 20 MG PO TABS: 20 mg | ORAL | Qty: 1 | Fill #0

## 2024-05-22 MED FILL — PANTOPRAZOLE SODIUM 20 MG PO TBEC: 20 mg | ORAL | Qty: 1 | Fill #0

## 2024-05-22 MED FILL — METOPROLOL TARTRATE 50 MG PO TABS: 50 mg | ORAL | Qty: 1 | Fill #0

## 2024-05-22 MED FILL — ASPIRIN 325 MG PO TABS: 325 mg | ORAL | Qty: 1 | Fill #0

## 2024-05-22 MED FILL — DISULFIRAM 250 MG PO TABS: 250 mg | ORAL | Qty: 1 | Fill #0

## 2024-05-22 MED FILL — DONEPEZIL HCL 10 MG PO TABS: 10 mg | ORAL | Qty: 1 | Fill #0

## 2024-05-22 MED FILL — TAMSULOSIN HCL 0.4 MG PO CAPS: 0.4 mg | ORAL | Qty: 2 | Fill #0

## 2024-05-22 MED FILL — VANCOMYCIN HCL 10 G IV SOLR: 10 g | INTRAVENOUS | Qty: 1250 | Fill #0

## 2024-05-22 MED FILL — ATORVASTATIN CALCIUM 10 MG PO TABS: 10 mg | ORAL | Qty: 1 | Fill #0

## 2024-05-22 MED FILL — QUETIAPINE FUMARATE 50 MG PO TABS: 50 mg | ORAL | Qty: 1 | Fill #0

## 2024-05-22 MED FILL — ENOXAPARIN SODIUM 40 MG/0.4ML IJ SOSY: 40 MG/0.4ML | INTRAMUSCULAR | Qty: 0.4 | Fill #0

## 2024-05-22 NOTE — Progress Notes (Signed)
 "  [facility]      Patient:  Raymond Skinner  Date of Birth: 1955/05/28  Date of Service: 05/22/2024  MRN: [id number]   Acct: [id number]   Primary Care Physician: Joshua Dorothyann SAILOR, APRN  Advance Directive: Full Code  Admit Date: 05/14/2024       Hospital Day: 8  Portions of this note have been copied forward, however, changed to reflect the most current clinical status of this patient.  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  Patient was recently hospitalized on 12/4 due to rhabdomyolysis this improved with IVF, did have urinary retention and indwelling Foley catheter was placed.  Patient was discharged 12/8 to [facility] SNF facility.   Patient presented to [facility] ER due to altered mental status.  Patient noted to have UTI and had been placed on oral antibiotics at SNF facility.  Without improvement SNF sent patient to [facility] ER for further evaluation.  Workup in ER CT head no acute intracranial abnormality, CT right hip no acute fracture, XR left foot mild soft tissue swelling without acute fracture, urinalysis small LE, WBC 41.  Patient initiated on Rocephin  and was admitted to hospitalist service. Urine culture moderate growth Enterococcus faecalis initiated on IV vancomycin . Has had improvement of mentation. Self-removed indwelling catheter, recurrent retention. Indwelling catheter reinserted. Initiated on scheduled bowel regimen with results. Able to state name upon exam. Awaiting pre-cert to return.    Review of Systems:   Review of Systems   Unable to perform ROS: Mental status change         Objective:   VITALS:  BP (!) 144/71   Pulse 56   Temp 97.6 F (36.4 C) (Temporal)   Resp 16   Ht 1.829 m (6' 0.01)   Wt 80.6 kg (177 lb 9.6 oz)   SpO2 99%   BMI 24.08 kg/m   24HR  INTAKE/OUTPUT:    Intake/Output Summary (Last 24 hours) at 05/22/2024 1312  Last data filed at 05/22/2024 0415  Gross per 24 hour   Intake 499.75 ml   Output 800 ml   Net -300.25 ml       Physical Exam  Vitals and nursing note reviewed.   Constitutional:       Appearance: Normal appearance.   HENT:      Mouth/Throat:      Mouth: Mucous membranes are moist.      Pharynx: Oropharynx is clear.   Eyes:      Extraocular Movements: Extraocular movements intact.      Conjunctiva/sclera: Conjunctivae normal.      Pupils: Pupils are equal, round, and reactive to light.   Cardiovascular:      Rate and Rhythm: Normal rate and regular rhythm.      Pulses: Normal pulses.      Heart sounds: Normal heart sounds. No murmur heard.  Pulmonary:      Effort: Pulmonary effort is normal. No respiratory distress.      Breath sounds: Normal breath sounds.   Abdominal:      General: Bowel sounds are normal. There is no distension.      Palpations: Abdomen is soft.      Tenderness: There is no abdominal tenderness. There is no guarding or rebound.   Musculoskeletal:  General: No swelling. Normal range of motion.      Cervical back: Normal range of motion and neck supple. No rigidity or tenderness.      Right lower leg: No edema.      Left lower leg: No edema.   Skin:     General: Skin is warm and dry.      Capillary Refill: Capillary refill takes less than 2 seconds.      Comments: Pressure injury to bilateral ankles, see media   Neurological:      Mental Status: He is alert.      Cranial Nerves: No cranial nerve deficit, dysarthria or facial asymmetry.      Motor: Weakness (generalized) present.      Comments: Disoriented to  place, time, and situation   Able to follow simple commands   Pleasantly confused    Psychiatric:         Mood and Affect: Mood normal.         Behavior: Behavior normal.         Medications:      sodium chloride  25 mL/hr at 05/22/24 9364      metoprolol  tartrate  50 mg Oral BID    lisinopril   20 mg Oral Daily     QUEtiapine   50 mg Oral Nightly    vancomycin   1,250 mg IntraVENous Q12H    vancomycin  (VANCOCIN ) intermittent dosing (placeholder)   Other RX Placeholder    aspirin   325 mg Oral Daily    disulfiram   250 mg Oral BID    donepezil   10 mg Oral Nightly    [Held by provider] memantine   10 mg Oral BID    pantoprazole   20 mg Oral QAM AC    atorvastatin   10 mg Oral Daily    tamsulosin   0.8 mg Oral Daily    sodium chloride  flush  5-40 mL IntraVENous 2 times per day    enoxaparin   40 mg SubCUTAneous Daily     hydrOXYzine  HCl, hydrALAZINE , sodium chloride  flush, sodium chloride , ondansetron  **OR** ondansetron , acetaminophen  **OR** acetaminophen , polyethylene glycol  ADULT DIET; Easy to Chew  ADULT ORAL NUTRITION SUPPLEMENT; Lunch, Dinner; Wound Healing Oral Supplement     Lab and other Data:     Recent Labs     05/20/24  0257 05/21/24  0228 05/22/24  0254   WBC 5.6 5.1 5.5   HGB 13.3* 13.1* 13.1*   PLT 272 245 234     Recent Labs     05/20/24  0257 05/21/24  0228 05/22/24  0254   NA 139 141 140   K 4.1 4.0 3.6   CL 103 105 104   CO2 26 20* 26   BUN 16 19 16    CREATININE 0.8 0.7 0.7   GLUCOSE 112* 135* 140*     No results for input(s): AST, ALT, BILITOT, ALKPHOS in the last 72 hours.    Invalid input(s): ALB    Troponin T: No results for input(s): TROPONINI in the last 72 hours.  Pro-BNP: No results for input(s): BNP in the last 72 hours.  INR: No results for input(s): INR in the last 72 hours.  UA:  No results for input(s): NITRITE, COLORU, PHUR, LABCAST, WBCUA, RBCUA, MUCUS, TRICHOMONAS, YEAST, BACTERIA, CLARITYU, SPECGRAV, LEUKOCYTESUR, UROBILINOGEN, BILIRUBINUR, BLOODU, GLUCOSEU, AMORPHOUS in the last 72 hours.    Invalid input(s): KETONESU    A1C: No results for input(s): LABA1C in the last 72 hours.  ABG:No results for input(s): PHART, PCO2ART,  PO2ART, HCO3ART, BEART, HGBAE, O2SATART, CARBOXHGBART in the last 72 hours.    RAD:   XR FOOT LEFT (MIN 3  VIEWS)  Result Date: 05/16/2024   1.  Mild soft tissue swelling without visible acute fracture. 2. Chronic findings as above.    ______________________________________ Electronically signed by: NORLEEN KURK M.D. Date:     05/16/2024 Time:    06:58     CT HIP RIGHT WO CONTRAST  Result Date: 05/14/2024   1. No acute fracture.   All CT scans are performed using dose optimization techniques as appropriate to the performed exam and includes at least one of the following:  Automated exposure control, adjustment of the mA and/or kV according to size, and the use of iterative reconstruction technique.  All CT scans are performed using dose optimization techniques as appropriate to the performed exam and include at least one of the following: Automated exposure control, adjustment of the mA and/or kV according to size, and the use of iterative reconstruction technique.  ______________________________________ Electronically signed by: GUSTAVO HIGHTOWER M.D. Date:     05/14/2024 Time:    17:50     XR HIP 2-3 VW W PELVIS RIGHT  Result Date: 05/14/2024   Hip arthroplasty on the right  Degenerative changes of the spine and pelvis.  No displaced fracture evident however there is a subtle degree of lucency in the medial border of the greater trochanter which is a change from prior.  If there is concern for occult fracture recommend CT.    ______________________________________ Electronically signed by: GUSTAVO HIGHTOWER M.D. Date:     05/14/2024 Time:    16:52     CT HEAD WO CONTRAST  Result Date: 05/14/2024  1.  No acute intracranial process. 2. 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  All CT scans are performed using dose optimization techniques as appropriate to the performed exam and include at least one of the following: Automated exposure control, adjustment of the mA and/or kV according to size, and the use of iterative reconstruction technique.  ______________________________________  Electronically signed by: GLENDIA BIRK M.D. Date:     05/14/2024 Time:    15:39     Assessment/Plan   Principal Problem:    UTI (urinary tract infection)   -Urinalysis & Culture    Culture Corynebacterium striatum, Enterococcus faecalis   -IV antibiotic Vancomycin  X 7 days    -Monitor I&O  Active Problems    Dementia    Aricept , seroquel    Held namenda     Fall precautions   PT/OT/SLP    Alcohol abuse   Disulfiram    Hypertension    144/71    Lisinopril , lopressor    As needed anti-hypertensive      Hyperlipidemia   Statin therapy     Urinary retention   Indwelling catheter re-inserted    Flomax     Monitor I&O    Moderate malnutrition   Dieitican consulted   Constipation    Scheduled bowel regimen increased with success    DVT Prophylaxis: lovenox     Social worker following pre-cert initiated       Leontine CHRISTELLA Pinal, APRN - CNP, 05/22/2024 1:12 PM  "

## 2024-05-22 NOTE — Plan of Care (Signed)
"    Problem: Safety - Adult  Goal: Free from fall injury  Recent Flowsheet Documentation  Taken 05/22/2024 0037 by Vernel Avelina LABOR, LPN  Free From Fall Injury: Instruct family/caregiver on patient safety     Problem: Safety - Adult  Goal: Free from fall injury  Recent Flowsheet Documentation  Taken 05/22/2024 0037 by Vernel Avelina LABOR, LPN  Free From Fall Injury: Instruct family/caregiver on patient safety     Problem: Safety - Adult  Goal: Free from fall injury  05/22/2024 1034 by Ebony Yorio, RN  Outcome: Progressing  05/22/2024 0335 by Vernel Avelina LABOR, LPN  Outcome: Progressing  Flowsheets (Taken 05/22/2024 0037)  Free From Fall Injury: Instruct family/caregiver on patient safety     Problem: Cardiovascular - Adult  Goal: Maintains optimal cardiac output and hemodynamic stability  Recent Flowsheet Documentation  Taken 05/21/2024 2135 by Vernel Avelina LABOR, LPN  Maintains optimal cardiac output and hemodynamic stability:   Monitor blood pressure and heart rate   Monitor urine output and notify Licensed Independent Practitioner for values outside of normal range   Assess for signs of decreased cardiac output  Goal: Absence of cardiac dysrhythmias or at baseline  Recent Flowsheet Documentation  Taken 05/21/2024 2135 by Vernel Avelina LABOR, LPN  Absence of cardiac dysrhythmias or at baseline:   Monitor cardiac rate and rhythm   Assess for signs of decreased cardiac output   Administer antiarrhythmia medication and electrolyte replacement as ordered     Problem: Gastrointestinal - Adult  Goal: Maintains or returns to baseline bowel function  Recent Flowsheet Documentation  Taken 05/21/2024 2135 by Vernel Avelina LABOR, LPN  Maintains or returns to baseline bowel function: Assess bowel function     "

## 2024-05-22 NOTE — Plan of Care (Signed)
 "  Problem: Safety - Adult  Goal: Free from fall injury  Outcome: Progressing  Flowsheets (Taken 05/22/2024 0037)  Free From Fall Injury: Instruct family/caregiver on patient safety     Problem: Musculoskeletal - Adult  Goal: Return mobility to safest level of function  Outcome: Progressing  Flowsheets (Taken 05/21/2024 2135)  Return Mobility to Safest Level of Function:   Assess patient stability and activity tolerance for standing, transferring and ambulating with or without assistive devices   Assist with transfers and ambulation using safe patient handling equipment as needed   Ensure adequate protection for wounds/incisions during mobilization  Goal: Maintain proper alignment of affected body part  Outcome: Progressing  Flowsheets (Taken 05/21/2024 2135)  Maintain proper alignment of affected body part: Support and protect limb and body alignment per provider's orders  Goal: Return ADL status to a safe level of function  Outcome: Progressing  Flowsheets (Taken 05/21/2024 2135)  Return ADL Status to a Safe Level of Function:   Assess activities of daily living deficits and provide assistive devices as needed   Obtain physical therapy/occupational therapy consults as needed   Assist and instruct patient to increase activity and self care as tolerated     Problem: Infection - Adult  Goal: Absence of infection at discharge  Outcome: Progressing  Flowsheets (Taken 05/21/2024 2135)  Absence of infection at discharge:   Assess and monitor for signs and symptoms of infection   Monitor lab/diagnostic results   Monitor all insertion sites i.e., indwelling lines, tubes and drains   Monitor endotracheal (as able) and nasal secretions for changes in amount and color   Instruct and encourage patient and family to use good hand hygiene technique   Identify and instruct in appropriate isolation precautions for identified infection/condition  Goal: Absence of infection during hospitalization  Outcome: Progressing  Flowsheets (Taken  05/21/2024 2135)  Absence of infection during hospitalization:   Assess and monitor for signs and symptoms of infection   Monitor lab/diagnostic results   Monitor all insertion sites i.e., indwelling lines, tubes and drains   Monitor endotracheal (as able) and nasal secretions for changes in amount and color   Instruct and encourage patient and family to use good hand hygiene technique   Identify and instruct in appropriate isolation precautions for identified infection/condition     Problem: Metabolic/Fluid and Electrolytes - Adult  Goal: Electrolytes maintained within normal limits  Outcome: Progressing  Flowsheets (Taken 05/21/2024 2135)  Electrolytes maintained within normal limits:   Monitor labs and assess patient for signs and symptoms of electrolyte imbalances   Administer electrolyte replacement as ordered   Monitor response to electrolyte replacements, including repeat lab results as appropriate     Problem: Skin/Tissue Integrity  Goal: Skin integrity remains intact  Description: 1.  Monitor for areas of redness and/or skin breakdown  Outcome: Progressing  Flowsheets (Taken 05/21/2024 2135)  Skin Integrity Remains Intact: Monitor for areas of redness and/or skin breakdown     Problem: Nutrition Deficit:  Goal: Optimize nutritional status  Outcome: Progressing  Flowsheets (Taken 05/20/2024 1249 by Evetta Charlies BROCKS, MS, RD, LD)  Nutrient intake appropriate for improving, restoring, or maintaining nutritional needs:   Assess nutritional status and recommend course of action   Monitor oral intake, labs, and treatment plans   Recommend appropriate diets, oral nutritional supplements, and vitamin/mineral supplements     Problem: ABCDS Injury Assessment  Goal: Absence of physical injury  Outcome: Progressing  Flowsheets (Taken 05/22/2024 0037)  Absence of Physical Injury:  Implement safety measures based on patient assessment     "

## 2024-05-23 LAB — BASIC METABOLIC PANEL W/ REFLEX TO MG FOR LOW K
Anion Gap: 9 mmol/L (ref 8–16)
BUN: 13 mg/dL (ref 8–23)
CO2: 25 mmol/L (ref 22–29)
Calcium: 8.9 mg/dL (ref 8.8–10.2)
Chloride: 105 mmol/L (ref 98–107)
Creatinine: 0.7 mg/dL (ref 0.7–1.2)
Est, Glom Filt Rate: >90 (ref >60)
Glucose: 128 mg/dL — ABNORMAL HIGH (ref 70–99)
Potassium reflex Magnesium: 3.8 mmol/L (ref 3.5–5.0)
Sodium: 139 mmol/L (ref 136–145)

## 2024-05-23 LAB — CBC WITH AUTO DIFFERENTIAL
Basophils %: 1 % (ref 0.0–1.0)
Basophils Absolute: 0.1 K/uL (ref 0.00–0.20)
Eosinophils %: 4.2 % (ref 0.0–5.0)
Eosinophils Absolute: 0.3 K/uL (ref 0.00–0.60)
Hematocrit: 38.4 % — ABNORMAL LOW (ref 42.0–52.0)
Hemoglobin: 13.2 g/dL — ABNORMAL LOW (ref 14.0–18.0)
Immature Granulocytes #: 0 K/uL
Lymphocytes %: 33.6 % (ref 20.0–40.0)
Lymphocytes Absolute: 2.1 K/uL (ref 1.1–4.5)
MCH: 28.5 pg (ref 27.0–31.0)
MCHC: 34.4 g/dL (ref 33.0–37.0)
MCV: 82.9 fL (ref 80.0–94.0)
MPV: 9.4 fL (ref 9.4–12.4)
Monocytes %: 10 % (ref 0.0–10.0)
Monocytes Absolute: 0.6 K/uL (ref 0.00–0.90)
Neutrophils %: 51 % (ref 50.0–65.0)
Neutrophils Absolute: 3.2 K/uL (ref 1.5–7.5)
Platelets: 235 K/uL (ref 130–400)
RBC: 4.63 M/uL — ABNORMAL LOW (ref 4.70–6.10)
RDW: 13.2 % (ref 11.5–14.5)
WBC: 6.2 K/uL (ref 4.8–10.8)

## 2024-05-23 MED ORDER — BISACODYL 10 MG RE SUPP
10 | Freq: Once | RECTAL | Status: DC
Start: 2024-05-23 — End: 2024-05-23

## 2024-05-23 MED ORDER — LISINOPRIL 20 MG PO TABS
20 | ORAL_TABLET | Freq: Every day | ORAL | 0 refills | 30.00000 days | Status: AC
Start: 2024-05-23 — End: ?

## 2024-05-23 MED ORDER — METOPROLOL TARTRATE 50 MG PO TABS
50 | ORAL_TABLET | Freq: Two times a day (BID) | ORAL | 0 refills | 30.00000 days | Status: AC
Start: 2024-05-23 — End: ?

## 2024-05-23 MED FILL — DISULFIRAM 250 MG PO TABS: 250 mg | ORAL | Qty: 1 | Fill #0

## 2024-05-23 MED FILL — PANTOPRAZOLE SODIUM 20 MG PO TBEC: 20 mg | ORAL | Qty: 1 | Fill #0

## 2024-05-23 MED FILL — POLYETHYLENE GLYCOL 3350 17 G PO PACK: 17 g | ORAL | Qty: 1 | Fill #0

## 2024-05-23 MED FILL — TAMSULOSIN HCL 0.4 MG PO CAPS: 0.4 mg | ORAL | Qty: 2 | Fill #0

## 2024-05-23 MED FILL — METOPROLOL TARTRATE 50 MG PO TABS: 50 mg | ORAL | Qty: 1 | Fill #0

## 2024-05-23 MED FILL — ASPIRIN 325 MG PO TABS: 325 mg | ORAL | Qty: 1 | Fill #0

## 2024-05-23 MED FILL — HYDROXYZINE HCL 10 MG PO TABS: 10 mg | ORAL | Qty: 1 | Fill #0

## 2024-05-23 MED FILL — DONEPEZIL HCL 10 MG PO TABS: 10 mg | ORAL | Qty: 1 | Fill #0

## 2024-05-23 MED FILL — LISINOPRIL 20 MG PO TABS: 20 mg | ORAL | Qty: 1 | Fill #0

## 2024-05-23 MED FILL — QUETIAPINE FUMARATE 50 MG PO TABS: 50 mg | ORAL | Qty: 1 | Fill #0

## 2024-05-23 MED FILL — ATORVASTATIN CALCIUM 10 MG PO TABS: 10 mg | ORAL | Qty: 1 | Fill #0

## 2024-05-23 NOTE — Care Coordination (Signed)
"  Sent POA info to Registration  "

## 2024-05-23 NOTE — Care Coordination (Signed)
"     05/23/24 1306   IMM Letter   IMM Letter given to Patient/Family/Significant other/Guardian/POA/by: Mardi Chime   IMM Letter date given: 05/23/24   IMM Letter time given: 0106     Patient declined waiting 4 hr period prior to discharge.  Second IMM given to patient and explained with patient verbalizing understanding.  All questions and concerns addressed     Signed letter placed in pt soft chart  Electronically signed by MARDI CHIME, MSW on 05/23/2024 at 1:07 PM    "

## 2024-05-23 NOTE — Progress Notes (Signed)
"  Occupational Therapy  Facility/Department: [facility]COL[facility]Y [facility]  Daily Treatment Note  NAME: Raymond Skinner  DOB: November 10, 1955  MRN: [id number]    Date of Service: 05/23/2024    Discharge Recommendations:  24 hour supervision or assist, Patient would benefit from continued therapy after discharge         Patient Diagnosis(es): The primary encounter diagnosis was Disorientation. Diagnoses of Urinary tract infection associated with catheterization of urinary tract, unspecified indwelling urinary catheter type, initial encounter, Hematuria, unspecified type, and Multiple falls were also pertinent to this visit.     Assessment   Assessment: Pt, was supervision with sitting balance but after a short period, decided he had set up long enough. Returned to supine with CGA  Activity Tolerance: Patient limited by fatigue;Patient limited by endurance;Treatment limited secondary to decreased cognition     Plan       Restrictions       Subjective  Subjective  Subjective: Pt. sitting up in bed.  Encouraged to wake up.  Orientation  Overall Orientation Status:  (Difficult to assess talks very little.)  Pain: No C/O pain  Cognition  Overall Cognitive Status: Exceptions  Arousal/Alertness: Delayed responses to stimuli  Following Commands: Follows one step commands with increased time;Follows one step commands with repetition  Attention Span: Impaired;Difficulty attending to directions;Difficulty dividing attention;Attends with cues to redirect  Safety Judgement: Decreased awareness of need for safety;Decreased awareness of need for assistance;Good awareness of safety precautions  Problem Solving: Assistance required to generate solutions;Assistance required to implement solutions  Insights: Decreased awareness of deficits  Initiation: Requires cues for all  Sequencing: Requires cues for all  Cognition Comment: Great difficulty following commands.       Objective  Vitals     Bed Mobility Training  Bed Mobility Training: Yes  Rolling:  Substantial/Maximal assistance;2 Person assistance  Supine to Sit: 2 Person assistance;Partial/Moderate assistance  Sit to Supine: Contact guard assistance  Balance  Sitting: Impaired (Sat EOB about 5 minutes CGA had to return 2 fatigue.)                        Goals  Short Term Goals  Short Term Goal 1: The patient will demo sufficient cognition to meaningfully participate in any simple self care tasks consistently  Short Term Goal 2: The patient will demo sufficient cognition to stand/transfer with assist as needed    AM-PAC - ADL       Therapy Time   Individual Concurrent Group Co-treatment   Time In           Time Out           Minutes                   Tanda FORBES Lunger, OTR/L     "

## 2024-05-23 NOTE — Care Coordination (Signed)
"  Housekeeping found patient teeth and items of clothing in room. Tried [employer] on chart and no answer. Placed in soiled utility room   "

## 2024-05-23 NOTE — Progress Notes (Signed)
 Physician Progress Note      PATIENT:               Raymond Skinner, Raymond Skinner  CSN #:                  [id number]  DOB:                       [birth year]  ADMIT DATE:       05/14/2024 3:27 PM  DISCH DATE:        05/23/2024 5:24 PM  RESPONDING  PROVIDER #:        Velma Archer MD          QUERY TEXT:    UTI is documented in the medical record on 05/14/24 by Mathew Domino APRN in   H&P and subsequent notes following.  Please clarify the relationship, if any,   with the UTI and foley catheter.    The clinical indicators include:  -UTI with indwelling foley...had a chronic indwelling Foley.SABRASABRAUrinary tract   infection associated with catheterization of urinary tract, unspecified   indwelling urinary catheter type. (Per Dr. Latanya in ED note on 05/14/25)  -UTI (urinary tract infection).SABRASABRAContinue indwelling foley catheter. 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    Query created by: Maurice Simmer on 05/31/2024 8:20 AM      Electronically signed by:  Velma Archer MD 06/01/2024 6:34 PM

## 2024-05-23 NOTE — Plan of Care (Signed)
"    Problem: Safety - Adult  Goal: Free from fall injury  Outcome: Progressing  Flowsheets (Taken 05/23/2024 0020)  Free From Fall Injury: Instruct family/caregiver on patient safety     Problem: Musculoskeletal - Adult  Goal: Return mobility to safest level of function  Outcome: Progressing  Flowsheets (Taken 05/22/2024 2117)  Return Mobility to Safest Level of Function:   Assess patient stability and activity tolerance for standing, transferring and ambulating with or without assistive devices   Assist with transfers and ambulation using safe patient handling equipment as needed   Ensure adequate protection for wounds/incisions during mobilization  Goal: Maintain proper alignment of affected body part  Outcome: Progressing  Flowsheets (Taken 05/22/2024 2117)  Maintain proper alignment of affected body part: Support and protect limb and body alignment per provider's orders  Goal: Return ADL status to a safe level of function  Outcome: Progressing  Flowsheets (Taken 05/22/2024 2117)  Return ADL Status to a Safe Level of Function: Assess activities of daily living deficits and provide assistive devices as needed     Problem: Infection - Adult  Goal: Absence of infection at discharge  Outcome: Progressing  Flowsheets (Taken 05/22/2024 2117)  Absence of infection at discharge:   Assess and monitor for signs and symptoms of infection   Monitor lab/diagnostic results   Monitor all insertion sites i.e., indwelling lines, tubes and drains   Monitor endotracheal (as able) and nasal secretions for changes in amount and color   Instruct and encourage patient and family to use good hand hygiene technique   Identify and instruct in appropriate isolation precautions for identified infection/condition  Goal: Absence of infection during hospitalization  Outcome: Progressing  Flowsheets (Taken 05/22/2024 2117)  Absence of infection during hospitalization:   Assess and monitor for signs and symptoms of infection   Monitor lab/diagnostic results    Monitor all insertion sites i.e., indwelling lines, tubes and drains   Monitor endotracheal (as able) and nasal secretions for changes in amount and color   Identify and instruct in appropriate isolation precautions for identified infection/condition   Instruct and encourage patient and family to use good hand hygiene technique     Problem: Metabolic/Fluid and Electrolytes - Adult  Goal: Electrolytes maintained within normal limits  Outcome: Progressing  Flowsheets (Taken 05/22/2024 2117)  Electrolytes maintained within normal limits:   Monitor labs and assess patient for signs and symptoms of electrolyte imbalances   Administer electrolyte replacement as ordered   Monitor response to electrolyte replacements, including repeat lab results as appropriate     Problem: Skin/Tissue Integrity  Goal: Skin integrity remains intact  Description: 1.  Monitor for areas of redness and/or skin breakdown  Outcome: Progressing  Flowsheets (Taken 05/22/2024 2117)  Skin Integrity Remains Intact: Monitor for areas of redness and/or skin breakdown     Problem: Nutrition Deficit:  Goal: Optimize nutritional status  Outcome: Progressing  Flowsheets (Taken 05/20/2024 1249 by Evetta Charlies BROCKS, MS, RD, LD)  Nutrient intake appropriate for improving, restoring, or maintaining nutritional needs:   Assess nutritional status and recommend course of action   Monitor oral intake, labs, and treatment plans   Recommend appropriate diets, oral nutritional supplements, and vitamin/mineral supplements     Problem: ABCDS Injury Assessment  Goal: Absence of physical injury  Outcome: Progressing  Flowsheets (Taken 05/23/2024 0020)  Absence of Physical Injury: Implement safety measures based on patient assessment     "

## 2024-05-23 NOTE — Progress Notes (Signed)
 05/23/24 1100   Subjective   Subjective Patient agrees to sit EOB.  Renay Lunger, OT present.   Pain No C/O pain   Cognition   Overall Cognitive Status Exceptions   Arousal/Alertness Delayed responses to stimuli   Following Commands Follows one step commands with increased time;Follows one step commands with repetition   Attention Span Impaired;Difficulty attending to directions;Difficulty dividing attention;Attends with cues to redirect   Safety Judgement Decreased awareness of need for safety;Decreased awareness of need for assistance;Good awareness of safety precautions   Problem Solving Assistance required to generate solutions;Assistance required to implement solutions   Insights Decreased awareness of deficits   Initiation Requires cues for all   Sequencing Requires cues for all   Cognition Comment Great difficulty following commands.   Orientation   Overall Orientation Status   (Difficult to assess talks very little.)   Vitals   O2 Device None (Room air)   Bed Mobility Training   Bed Mobility Training Yes   Rolling Substantial/Maximal assistance;2 Person assistance   Supine to Sit Substantial/Maximal assistance;2 Person assistance   Sit to Supine Minimal assistance;2 Person assistance   Balance   Sitting Impaired  (Sat EOB about 5 minutes CGA had to return 2 fatigue.)   Patient Education   Education Given To Patient   Education Provided Role of Therapy;Plan of Care;Fall Prevention Strategies   Education Provided Comments Use of call light, staff assist.   Education Method Demonstration;Teach Back   Barriers to Learning Cognition   Education Outcome Continued education needed;Unable to verbalize;Unable to demonstrate understanding   Other Specialty Interventions   Other Treatments/Modalities BTB call light all needs in reach bed alarm set.   Virtual Patient Safety Companion present.   Assessment   Activity Tolerance Patient limited by fatigue;Patient limited by endurance;Treatment limited secondary to  decreased cognition   PT Plan of Care   Monday X       Electronically signed by Laurier JINNY Sharps, PTA on 05/23/2024 at 11:46 AM

## 2024-05-23 NOTE — Care Coordination (Addendum)
"  Sw updated notes to insurance portal . Insurance has requested new PT/OT note today 05/23/24 before 1:30PM . Sw has perfected served PT/OT for new notes to upload to portal.   Electronically signed by MARDI CHIME, MSW on 05/23/2024 at 8:23 AM    Uploaded requesting notes to insurance portal.   [id number] SNF12/31/2025 05/23/2024- 05/25/2024 Approved3/3/0 .    [address] [facility]  Phone: [phone]  Fax: [phone]  Admissions Fax: [phone]  Please call Marissa RN to give nurse report to.   Electronically signed by MARDI CHIME, MSW on 05/23/2024 at 12:59 PM    "

## 2024-05-23 NOTE — Progress Notes (Signed)
"  Report called to Daved, CHARITY FUNDRAISER at [facility].     Electronically signed by Jacilyn Sanpedro, RN on 05/23/2024 at 3:28 PM   "

## 2024-05-23 NOTE — Plan of Care (Signed)
 "  Problem: Safety - Adult  Goal: Free from fall injury  Recent Flowsheet Documentation  Taken 05/23/2024 0020 by Vernel Avelina LABOR, LPN  Free From Fall Injury: Instruct family/caregiver on patient safety     Problem: Safety - Adult  Goal: Free from fall injury  Recent Flowsheet Documentation  Taken 05/23/2024 0020 by Vernel Avelina LABOR, LPN  Free From Fall Injury: Instruct family/caregiver on patient safety     Problem: Safety - Adult  Goal: Free from fall injury  05/23/2024 1319 by Mouhamadou Gittleman, RN  Outcome: Adequate for Discharge  05/23/2024 0325 by Vernel Avelina LABOR, LPN  Outcome: Progressing  Flowsheets (Taken 05/23/2024 0020)  Free From Fall Injury: Instruct family/caregiver on patient safety     Problem: Musculoskeletal - Adult  Goal: Return mobility to safest level of function  05/23/2024 1319 by Nomi Rudnicki, RN  Outcome: Adequate for Discharge  05/23/2024 0325 by Vernel Avelina LABOR, LPN  Outcome: Progressing  Flowsheets (Taken 05/22/2024 2117)  Return Mobility to Safest Level of Function:   Assess patient stability and activity tolerance for standing, transferring and ambulating with or without assistive devices   Assist with transfers and ambulation using safe patient handling equipment as needed   Ensure adequate protection for wounds/incisions during mobilization  Goal: Maintain proper alignment of affected body part  05/23/2024 1319 by Hai Grabe, RN  Outcome: Adequate for Discharge  05/23/2024 0325 by Vernel Avelina LABOR, LPN  Outcome: Progressing  Flowsheets (Taken 05/22/2024 2117)  Maintain proper alignment of affected body part: Support and protect limb and body alignment per provider's orders  Goal: Return ADL status to a safe level of function  05/23/2024 1319 by Amandeep Nesmith, RN  Outcome: Adequate for Discharge  05/23/2024 0325 by Vernel Avelina LABOR, LPN  Outcome: Progressing  Flowsheets (Taken 05/22/2024 2117)  Return ADL Status to a Safe Level of Function: Assess activities of daily living deficits and  provide assistive devices as needed     Problem: Infection - Adult  Goal: Absence of infection at discharge  05/23/2024 1319 by Cadon Raczka, RN  Outcome: Adequate for Discharge  05/23/2024 0325 by Vernel Avelina LABOR, LPN  Outcome: Progressing  Flowsheets (Taken 05/22/2024 2117)  Absence of infection at discharge:   Assess and monitor for signs and symptoms of infection   Monitor lab/diagnostic results   Monitor all insertion sites i.e., indwelling lines, tubes and drains   Monitor endotracheal (as able) and nasal secretions for changes in amount and color   Instruct and encourage patient and family to use good hand hygiene technique   Identify and instruct in appropriate isolation precautions for identified infection/condition  Goal: Absence of infection during hospitalization  05/23/2024 1319 by Kael Keetch, RN  Outcome: Adequate for Discharge  05/23/2024 0325 by Vernel Avelina LABOR, LPN  Outcome: Progressing  Flowsheets (Taken 05/22/2024 2117)  Absence of infection during hospitalization:   Assess and monitor for signs and symptoms of infection   Monitor lab/diagnostic results   Monitor all insertion sites i.e., indwelling lines, tubes and drains   Monitor endotracheal (as able) and nasal secretions for changes in amount and color   Identify and instruct in appropriate isolation precautions for identified infection/condition   Instruct and encourage patient and family to use good hand hygiene technique     Problem: Metabolic/Fluid and Electrolytes - Adult  Goal: Electrolytes maintained within normal limits  05/23/2024 1319 by Lakechia Nay, RN  Outcome: Adequate for Discharge  05/23/2024 0325 by Vernel Avelina LABOR, LPN  Outcome: Progressing  Flowsheets (Taken 05/22/2024 2117)  Electrolytes maintained within normal limits:   Monitor labs and assess patient for signs and symptoms of electrolyte imbalances   Administer electrolyte replacement as ordered   Monitor response to electrolyte replacements, including repeat lab  results as appropriate     Problem: Skin/Tissue Integrity  Goal: Skin integrity remains intact  Description: 1.  Monitor for areas of redness and/or skin breakdown  05/23/2024 1319 by Myasia Sinatra, RN  Outcome: Adequate for Discharge  05/23/2024 0325 by Vernel Avelina LABOR, LPN  Outcome: Progressing  Flowsheets (Taken 05/22/2024 2117)  Skin Integrity Remains Intact: Monitor for areas of redness and/or skin breakdown     Problem: ABCDS Injury Assessment  Goal: Absence of physical injury  Recent Flowsheet Documentation  Taken 05/23/2024 0020 by Vernel Avelina LABOR, LPN  Absence of Physical Injury: Implement safety measures based on patient assessment     Problem: ABCDS Injury Assessment  Goal: Absence of physical injury  05/23/2024 1319 by Magda Muise, RN  Outcome: Adequate for Discharge  05/23/2024 0325 by Vernel Avelina LABOR, LPN  Outcome: Progressing  Flowsheets (Taken 05/23/2024 0020)  Absence of Physical Injury: Implement safety measures based on patient assessment     "

## 2024-05-23 NOTE — Discharge Summary (Signed)
 "  [facility]    Discharge Summary      Raymond Skinner  DOB:  [birth year]  MRN:  [id number]    Admit date:  05/14/2024  Discharge date:    05/23/2024    Discharging Physician:  Dr. Velma Archer     Advance Directive: Full Code    Consults: none    Primary Care Physician:  Joshua Dorothyann SAILOR, APRN    Discharge Diagnoses:  Principal Problem:    UTI (urinary tract infection)  Active Problems:    Dementia (HCC)    Alcohol abuse    Hyperlipidemia    Urinary retention    Moderate malnutrition  Resolved Problems:    * No resolved hospital problems. *      Portions of this note have been copied forward, however, changed to reflect the most current clinical status of this patient.    Hospital Course:   The patient is a 69 y.o. male with PMH of Dementia, alcohol abuse, recent femur fracture who presented to [facility] ED from SNF for evaluation of altered mental status. Reportedly family stated patient had been altered for the past 3 days. Patient has had an indwelling catheter since surgery due to urinary retention. Patient was diagnosed with UTI and was started on oral antibiotic at the facility. Foley catheter was exchanged in ED per nursing.   Workup in ED revealed CT head with no acute intracranial process; XR right hip with pelvis revealed hip arthroplasty on the right, no degenerative changes of the spine and pelvis, no displaced fracture evident however there is a subtle degree of lucency in the medial border of the greater trochanter which is a change from prior; CT right hip with no acute fracture; UA with small LE, negative nitrite, 41 WBC, negative bacteria, urine culture pending; PCR respiratory panel negative; WBC 11.6, H&H 13.9/40.6, CMP unremarkable.  Patient was admitted to hospital medicine for further evaluation.  IV Rocephin  was initiated initially.  Urine culture indicated Corynebacterium Striatum and Enterococcus faecalis and was started on IV Vancomycin . Completed course of IV antibiotics in house. Patient is  currently in stable condition to be discharged back to SNF.           Significant Diagnostic Studies:   XR FOOT LEFT (MIN 3 VIEWS)  Result Date: 05/16/2024  EXAM: LEFT FOOT RADIOGRAPH  TECHNIQUE: 3 views.  Frontal, lateral, and oblique.  HISTORY:  Left foot swelling.  COMPARISON: None.  FINDINGS:  Mild soft tissue swelling. No visible soft tissue gas or foreign body. Moderate-sized calcaneal spurs at the plantar fascia and Achilles tendon insertions. Mild-to-moderate degenerative changes of the tibiotalar joint, midfoot, and first metatarsophalangeal joint. Smooth nonaggressive periosteal reaction along lateral aspect of the 4th metatarsal midshaft, potentially secondary to old trauma. No visible bone destruction or acute fracture.       1.  Mild soft tissue swelling without visible acute fracture. 2. Chronic findings as above.    ______________________________________ Electronically signed by: NORLEEN KURK M.D. Date:     05/16/2024 Time:    06:58     CT HIP RIGHT WO CONTRAST  Result Date: 05/14/2024  EXAM:  CT OF THE RIGHT HIP WITHOUT CONTRAST  TECHNIQUE:  CT of the right hip was performed without IV contrast. Multiplanar reformats were made.  HISTORY:  Fall.  Possible fracture.  COMPARISON:  None.  FINDINGS:  Degenerative changes are present within the sacroiliac joint as well as the visualized lumbar facets.  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  No acute fracture of the posterior aspect of the pelvic ring.   Anterior pelvic ring is intact.  Some enthesophyte formation is present about the greater trochanter.  No periprosthetic fracture evident however.       1. No acute fracture.   All CT scans are performed using dose optimization techniques as appropriate to the performed exam and includes at least one of the following:  Automated exposure control, adjustment of the mA and/or kV according to size, and the use of iterative reconstruction technique.  All CT  scans are performed using dose optimization techniques as appropriate to the performed exam and include at least one of the following: Automated exposure control, adjustment of the mA and/or kV according to size, and the use of iterative reconstruction technique.  ______________________________________ Electronically signed by: GUSTAVO HIGHTOWER M.D. Date:     05/14/2024 Time:    17:50     XR HIP 2-3 VW W PELVIS RIGHT  Result Date: 05/14/2024  EXAM:  PELVIS, 1 VIEW;  RIGHT HIP, 2 VIEW  HISTORY:  Fall.  COMPARISON: 01/14/2024  FINDINGS:  Hip arthroplasty is present.  Degenerative changes are present within the pelvis and spine.  Prominent enthesophyte along the iliac crests.  No hardware loosening evident at this time.  Lucency along the medial border of the greater trochanter which is a  slight change from prior.       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  No displaced fracture evident however there is a subtle degree of lucency in the medial border of the greater trochanter which is a change from prior.  If there is concern for occult fracture recommend CT.    ______________________________________ Electronically signed by: GUSTAVO HIGHTOWER M.D. Date:     05/14/2024 Time:    16:52     CT HEAD WO CONTRAST  Result Date: 05/14/2024  EXAMINATION: HEAD CT WITHOUT CONTRAST  HISTORY: Altered mental status  TECHNIQUE: Noncontrast CT of the brain was performed with images acquired from skull base to vertex. 2-D coronal and sagittal reformatted images were obtained from the axial source images. Contrast Dose: None. CT Dose Reduction Techniques Performed: Yes.  COMPARISON: 04/21/2024  FINDINGS: Topogram demonstrates no significant abnormality. Intraparenchymal hemorrhage: None. Parenchyma: Normal gray-white differentiation.  No mass effect or midline shift. Similar cerebral atrophy and periventricular hypodensities. Extra-axial spaces and basal cisterns: Normal. Ventricles: Normal size and  morphology for age. Paranasal sinuses and mastoid air cells: Visualized portions of paranasal sinuses are clear.  Mastoid air cells are clear. Orbits: Normal visualized portions. Sella/Skull Base: Normal. Other: Scalp and visualized soft tissues are normal.  Calvarium is normal.      1.  No acute intracranial process. 2. 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  All CT scans are performed using dose optimization techniques as appropriate to the performed exam and include at least one of the following: Automated exposure control, adjustment of the mA and/or kV according to size, and the use of iterative reconstruction technique.  ______________________________________ Electronically signed by: GLENDIA BIRK M.D. Date:     05/14/2024 Time:    15:39       Pertinent Labs:  CBC:   Recent Labs     05/21/24  0228 05/22/24  0254 05/23/24  0238   WBC 5.1 5.5 6.2   HGB 13.1* 13.1* 13.2*   PLT 245 234 235     BMP:  Recent Labs     05/21/24  0228 05/22/24  0254 05/23/24  0238   NA 141 140 139   K 4.0 3.6 3.8   CL 105 104 105   CO2 20* 26 25   BUN 19 16 13    CREATININE 0.7 0.7 0.7   GLUCOSE 135* 140* 128*     INR: No results for input(s): INR in the last 72 hours.    Physical Exam:   Vital Signs: BP (!) 143/86   Pulse 52   Temp 97.9 F (36.6 C) (Axillary)   Resp 18   Ht 1.829 m (6' 0.01)   Wt 79.4 kg (175 lb)   SpO2 100%   BMI 23.73 kg/m   General appearance:.Alert and Cooperative   HEENT: Normocephalic.  Chest: Lung sounds clear bilaterally without wheezes or rhonchi.  Cardiac: RRR, S1, S2 normal. No murmurs, gallops, or rubs auscultated.   Abdomen: soft, non-tender; non-distended normal bowel sounds no masses, no organomegaly.  Extremities: No clubbing or cyanosis. No peripheral edema. Peripheral pulses palpable.  Neurologic: Grossly intact.        Discharge Medications:          Medication List        START taking these medications      lisinopril  20 MG tablet  Commonly  known as: PRINIVIL ;ZESTRIL   Take 1 tablet by mouth daily  Start taking on: May 24, 2024     metoprolol  tartrate 50 MG tablet  Commonly known as: LOPRESSOR   Take 1 tablet by mouth 2 times daily            CONTINUE taking these medications      aspirin  325 MG tablet  Commonly known as: Bayer Aspirin   Take 1 tablet by mouth daily     cyanocobalamin  1000 MCG tablet     D3 + K2 PO     disulfiram  250 MG tablet  Commonly known as: ANTABUSE   Take 1 tablet by mouth in the morning and at bedtime     donepezil  10 MG tablet  Commonly known as: ARICEPT   Take 1 tablet by mouth nightly     Ginkgo 60 MG Tabs     Melatonin 10 MG Tabs     memantine  10 MG tablet  Commonly known as: NAMENDA   Take 1 tablet by mouth 2 times daily     pantoprazole  20 MG tablet  Commonly known as: PROTONIX   Take 1 tablet by mouth every morning (before breakfast)     simvastatin  20 MG tablet  Commonly known as: ZOCOR   Take 1 tablet by mouth nightly     tamsulosin  0.4 MG capsule  Commonly known as: FLOMAX   Take 2 capsules by mouth daily     therapeutic multivitamin-minerals tablet     vitamin B-6 100 MG tablet  Commonly known as: PYRIDOXINE             STOP taking these medications      QUEtiapine  25 MG tablet  Commonly known as: SEROQUEL                Where to Get Your Medications        These medications were sent to [facility] [place] [facility] [place], [place] - [address] - [place] [phone] [place] [phone]  [address] [place] [address]      Phone: [phone]   lisinopril  20 MG tablet  metoprolol  tartrate 50 MG tablet  Discharge Instructions:   Follow up with Joshua Dorothyann SAILOR, APRN in 7 days.     Take medications as directed.  Resume activity as tolerated.    Joshua Dorothyann SAILOR, APRN  [address]  [place] [address]  [phone]    Follow up in 1 week(s)      Joshua Dorothyann SAILOR, APRN  [address]  [place] [address]  [phone]               @  Future Appointments         Provider Specialty Dept Phone    10/06/2024  9:00 AM Joshua Dorothyann SAILOR, APRN Family Medicine [phone]               Diet: ADULT DIET; Easy to Chew  ADULT ORAL NUTRITION SUPPLEMENT; Lunch, Dinner; Wound Healing Oral Supplement     Disposition: Patient is medically stable and will be discharged back to SNF.    Return to ED immediately with any concerning signs or symptoms       Time spent on discharge 38 minutes spent in assessing patient, reviewing medications, discussion with nursing, confirming safe discharge plan and preparation of discharge summary.    Signed:  Electronically signed by Sallee KATHEE Blanch, APRN - CNP on 05/23/24 at 1:18 PM CST         EMR Dragon/Transcription disclaimer:   Much of this encounter note is an electronic transcription/translation of spoken language to printed text. The electronic translation of spoken language may permit erroneous, or at times, nonsensical words or phrases to be inadvertently transcribed; although attempts have made to review the note for such errors, some may still exist.    "

## 2024-05-24 ENCOUNTER — Emergency Department: Admit: 2024-05-25 | Payer: Medicare (Managed Care) | Primary: Family

## 2024-05-24 LAB — MISC ARUP 3

## 2024-05-24 NOTE — Telephone Encounter (Signed)
 Care Transitions Initial Follow Up Call    Patient: Raymond Skinner Patient DOB: [birth year]   MRN: [id number]  Reason for Admission: UTI  Discharge Date: 05/23/24      Discharge department/facility: [facility] made within 2 business days of discharge: Yes, left message - first attempt.    Scheduled appointment with PCP within 0-14 days    Follow Up  Future Appointments   Date Time Provider Department Center   10/06/2024  9:00 AM Joshua Dorothyann SAILOR, APRN [facility]       Rosina Dage, MA

## 2024-05-24 NOTE — ED Notes (Signed)
 Pt arrived to ER saturated in urine. Pt cleaned, new sheets and brief placed.

## 2024-05-24 NOTE — ED Provider Notes (Signed)
 [facility] EMERGENCY DEPARTMENT  EMERGENCY DEPARTMENT ENCOUNTER      [name]  MRN: [id number]  Birthdate 11/28/1955  Date of evaluation: 05/24/2024  Provider: Norleen FORBES Lyle Mickey, MD    CHIEF COMPLAINT       Chief Complaint   Patient presents with    Altered Mental Status     Recently discharged from hospital for uti Per EMS staff told them he was sitting in wheelchair and became stiff, almost falling out of chair his bp was low 60's SBP. No hx of seizures.          HISTORY OF PRESENT ILLNESS   (Location/Symptom, Timing/Onset,Context/Setting, Quality, Duration, Modifying Factors, Severity)  Note limiting factors.   Raymond Skinner is a 69 y.o. male who presents to the emergency department  from a nursing facility with acute onset altered mental status and stiffness, noted by caregivers as stiff as a board and nearly sliding out of his wheelchair. He was found to be confused, disoriented, and unable to answer questions appropriately. Initial blood pressure at the facility was 66/48, which improved to 127/76 after administration of a liter of IV fluids. There is no known history of seizures, and caregivers report his baseline mental status is normal. Patient denies pain but states he feels relatively hurting and reports his legs and hips are doing okay. He does not endorse any specific complaints of recent illness, but mentions having been sick recently and references his medications and monitoring at the facility.    Past medical history includes dementia, alcohol abuse, hyperlipidemia, urinary retention, and prior right hip surgery.    Review of prior medical records reveals the patient was admitted on December 27th and discharged yesterday after treatment for a urinary tract infection (UTI) with IV antibiotics, including vancomycin , following urine cultures positive for Corynebacterium striatum and Enterococcus faecalis. He had an indwelling Foley catheter exchanged during that admission.  Urinalysis showed small leukocyte esterase, negative nitrite, 41 WBCs, and negative bacteria. Viral panel was negative. He was previously admitted on December 4th and discharged on December 8th for altered mental status and alcohol intoxication, treated with IV fluids for rhabdomyolysis and monitored for alcohol withdrawal. Imaging included CT of the head (negative for acute findings) and x-ray/CT of the right hip.    Patient states he is unsure about his current location and believes he was at home upstairs, and wonders how he got downstairs. He also references his medications and monitoring, but is unable to provide further coherent history.     The patient (or guardian, if applicable) and other individuals in attendance with the patient were advised that Artificial Intelligence will be utilized during this visit to record, process the conversation to generate a clinical note, and support improvement of the AI technology. The patient (or guardian, if applicable) and others in attendance at the appointment consented to the use of AI, including the recording.    HPI    NursingNotes were reviewed.    REVIEW OF SYSTEMS    (2-9 systems for level 4, 10 or more for level 5)     Review of Systems   Unable to perform ROS: Other       A complete review of systems was performed and is negative except as noted above in the HPI.       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    HIP TOTAL ARTHROPLASTY performed by Dozier Gilmore DASEN, MD at [facility] OR         CURRENT MEDICATIONS       Discharge Medication List as of 05/25/2024  1:45 AM        CONTINUE these medications which have NOT CHANGED    Details   lisinopril  (PRINIVIL ;ZESTRIL ) 20 MG tablet Take 1 tablet by mouth daily, Disp-30 tablet, R-0Normal      metoprolol  tartrate  (LOPRESSOR ) 50 MG tablet Take 1 tablet by mouth 2 times daily, Disp-60 tablet, R-0Normal      tamsulosin  (FLOMAX ) 0.4 MG capsule Take 2 capsules by mouth daily, Disp-30 capsule, R-3Normal      pantoprazole  (PROTONIX ) 20 MG tablet Take 1 tablet by mouth every morning (before breakfast), Disp-30 tablet, R-0Normal      Melatonin 10 MG TABS Take 10 mg by mouth nightlyHistorical Med      Vitamin D -Vitamin K (D3 + K2 PO) Take by mouthHistorical Med      aspirin  (BAYER ASPIRIN ) 325 MG tablet Take 1 tablet by mouth daily, Disp-30 tablet, R-11Normal      simvastatin  (ZOCOR ) 20 MG tablet Take 1 tablet by mouth nightly, Disp-30 tablet, R-11Normal      donepezil  (ARICEPT ) 10 MG tablet Take 1 tablet by mouth nightly, Disp-30 tablet, R-11Normal      memantine  (NAMENDA ) 10 MG tablet Take 1 tablet by mouth 2 times daily, Disp-60 tablet, R-11Normal      disulfiram  (ANTABUSE ) 250 MG tablet Take 1 tablet by mouth in the morning and at bedtime, Disp-60 tablet, R-11Normal      Multiple Vitamins-Minerals (THERAPEUTIC MULTIVITAMIN-MINERALS) tablet Take 1 tablet by mouth dailyHistorical Med      cyanocobalamin  1000 MCG tablet Take 2.5 tablets by mouth dailyHistorical Med      Ginkgo 60 MG TABS Take by mouthHistorical Med      vitamin B-6 (PYRIDOXINE ) 100 MG tablet Take 1 tablet by mouth dailyHistorical Med             ALLERGIES     Patient has no known allergies.    FAMILY HISTORY       Family History   Problem Relation Age of Onset    Diabetes Mother     Dementia Father     No Known Problems Maternal Uncle     Dementia Paternal Aunt     Dementia Maternal Grandmother     Dementia Maternal Grandfather     Dementia Paternal Grandmother     Dementia Paternal Grandfather           SOCIAL HISTORY       Social History     Socioeconomic History    Marital status: Divorced   Tobacco Use    Smoking status: Former     Current packs/day: 0.00     Average packs/day: 0.5 packs/day for 42.4 years (21.2 ttl pk-yrs)     Types: Cigarettes     Start date:  12/31/1974     Quit date: 2019     Years since quitting: 7.0    Smokeless tobacco: Never   Vaping Use    Vaping status: Never Used   Substance and Sexual Activity    Alcohol use: Never    Drug use: Yes  Types: Marijuana Oda)   Social History Narrative    CODE STATUS: Full Code    HEALTH CARE PROXY: Mrs. [employer], a relative, [phone]    DOMICILED: with family at the bedside but planning for a nursing home     Social Drivers of Health     Financial Resource Strain: Low Risk  (11/05/2022)    Overall Financial Resource Strain (CARDIA)     Difficulty of Paying Living Expenses: Not hard at all   Food Insecurity: Patient Unable To Answer (05/16/2024)    Hunger Vital Sign     Worried About Programme Researcher, Broadcasting/film/video in the Last Year: Patient unable to answer     Ran Out of Food in the Last Year: Patient unable to answer   Transportation Needs: Patient Unable To Answer (05/16/2024)    PRAPARE - Transportation     Lack of Transportation (Medical): Patient unable to answer     Lack of Transportation (Non-Medical): Patient unable to answer   Physical Activity: Insufficiently Active (01/08/2024)    Exercise Vital Sign     Days of Exercise per Week: 5 days     Minutes of Exercise per Session: 10 min   Housing Stability: Patient Unable To Answer (05/16/2024)    Housing Stability Vital Sign     Unable to Pay for Housing in the Last Year: Patient unable to answer     Number of Times Moved in the Last Year: 0     Homeless in the Last Year: Patient unable to answer       SCREENINGS    Glasgow Coma Scale  Eye Opening: Spontaneous  Best Verbal Response: Confused  Best Motor Response: Obeys commands  Glasgow Coma Scale Score: 14        PHYSICAL EXAM    (up to 7 for level 4, 8 or more for level 5)     ED Triage Vitals [05/24/24 2245]   BP Girls Systolic BP Percentile Girls Diastolic BP Percentile Boys Systolic BP Percentile Boys Diastolic BP Percentile Temp Temp src Pulse   136/89 -- -- -- -- 98.2 F (36.8 C) -- 73       Respirations SpO2 Height Weight       18 98 % -- --           Physical Exam  Vitals and nursing note reviewed.   Constitutional:       General: He is not in acute distress.     Appearance: He is well-developed. He is not toxic-appearing or diaphoretic.   HENT:      Head: Normocephalic and atraumatic.      Mouth/Throat:      Mouth: Mucous membranes are moist.      Pharynx: Oropharynx is clear.   Eyes:      General: No scleral icterus.        Right eye: No discharge.         Left eye: No discharge.      Pupils: Pupils are equal, round, and reactive to light.   Cardiovascular:      Rate and Rhythm: Normal rate and regular rhythm.   Pulmonary:      Effort: Pulmonary effort is normal. No respiratory distress.      Breath sounds: No stridor.   Abdominal:      General: There is no distension.   Musculoskeletal:         General: No deformity. Normal range of motion.      Cervical  back: Normal range of motion.   Skin:     General: Skin is warm and dry.   Neurological:      General: No focal deficit present.      Mental Status: He is disoriented and confused.      GCS: GCS eye subscore is 4. GCS verbal subscore is 4. GCS motor subscore is 6.      Cranial Nerves: No cranial nerve deficit.      Motor: No abnormal muscle tone.   Psychiatric:         Cognition and Memory: Cognition is impaired. Memory is impaired.         DIAGNOSTIC RESULTS       RADIOLOGY:   Non-plain film images such as CT, Ultrasound and MRI are read by the radiologist. Plainradiographic images are visualized and preliminarily interpreted by the emergency physician with the below findings:      Interpretation per the Radiologist below, if available at the time of this note:    CT HEAD WO CONTRAST   Final Result   1.  No acute intracranial abnormality.  Chronic changes as described.        All CT scans are performed using dose optimization techniques as appropriate to the performed exam and include    at least one of the following: Automated exposure control,  adjustment of the mA and/or kV according to size, and the use of iterative reconstruction technique.        ______________________________________    Electronically signed by: LONNI METRO M.D.   Date:     05/25/2024   Time:    00:32       XR CHEST PORTABLE   Final Result       No acute finding in the chest.                   ______________________________________    Electronically signed by: DONNICE FRESHWATER M.D.   Date:     05/25/2024   Time:    23:59             ED BEDSIDE ULTRASOUND:   Performed by ED Physician - none    LABS:  Labs Reviewed   CBC WITH AUTO DIFFERENTIAL - Abnormal; Notable for the following components:       Result Value    RBC 4.56 (*)     Hemoglobin 13.0 (*)     Hematocrit 37.9 (*)     All other components within normal limits   COMPREHENSIVE METABOLIC PANEL - Abnormal; Notable for the following components:    Glucose 152 (*)     Calcium  8.5 (*)     Total Protein 6.0 (*)     Albumin 3.4 (*)     All other components within normal limits   URINALYSIS - Abnormal; Notable for the following components:    Clarity, UA CLOUDY (*)     Ketones, Urine 40 (*)     Blood, Urine LARGE (*)     Protein, UA 30 (*)     Leukocyte Esterase, Urine TRACE (*)     All other components within normal limits   MICROSCOPIC URINALYSIS - Abnormal; Notable for the following components:    RBC, UA 21-30 (*)     All other components within normal limits   MAGNESIUM    PROTIME-INR   ETHANOL   DRUG SCRN, BUPRENORPHINE       All other labs were within normal range or not returned as of this  dictation.    Medications - No data to display    EMERGENCY DEPARTMENT COURSE and DIFFERENTIALDIAGNOSIS/MDM:   Vitals:    Vitals:    05/24/24 2245 05/25/24 0015 05/25/24 0030   BP: 136/89 (!) 142/82 (!) 145/87   Pulse: 73 66 62   Resp: 18 18 10    Temp: 98.2 F (36.8 C)     SpO2: 98% 96% 95%         PROCEDURES:  Unless otherwise notedbelow, none  Procedures    EKG: All EKG's are interpreted by the Emergency Department Physician who either  signs or Co-signs this chart in the absence of a cardiologist.      MDM      ED Course as of 05/25/24 0654   Tue May 24, 2024   2342 EKG shows sinus rhythm with significant baseline artifact.  Normal QRS and QTc intervals.  No findings of acute ischemia or infarction [JP]   Wed May 25, 2024   0031 WBC: 6.3 [JP]   0049 RE-EVALUATION AT 12:49 AM:  - Discussed case with patient's POA/sister-in-law at bedside.  - Patient had an episode of unresponsiveness and stiffness with hypotension (BP in 60s/30s) at facility, improved with IV fluids.  - No evidence of seizure activity; episode likely due to transient hypotension, possibly vasovagal or related to unclear etiology.  - Recent infection appears resolved; labs, blood counts, kidney function, and urinalysis all reassuring.  - Recent head CT negative for acute findings.  - No evidence of dehydration or recurrent infection.  - Mental status is at or improved from recent baseline per family.  - No new neurological deficits.  - Cleared for return to nursing facility via ambulance; no indication for further inpatient care at this time.  - Family aware of plan and provided contact information for further concerns. [JP]      ED Course User Index  [JP] Lyle Norleen FORBES Mickey., MD     Evaluation and work-up here revealed no signs of emergent or life-threatening pathology that would necessitate admission for further work-up or management at this time.  Patient is felt to be stable for discharge home with return precautions for worsening of the condition or development of new concerning symptoms.  Patient was encouraged to follow-up with their primary care doctor in the appropriate timeframe.  Necessary prescriptions and information have been provided for treatment at home.  Patient voices understanding and agreement with the plan.    CONSULTS:  None        FINAL IMPRESSION     1. Hypotensive episode    2. Recent urinary tract infection    3. Dementia, unspecified dementia severity,  unspecified dementia type, unspecified whether behavioral, psychotic, or mood disturbance or anxiety [facility])          DISPOSITION/PLAN   DISPOSITION Decision To Discharge 05/25/2024 12:41:44 AM   DISPOSITION CONDITION Stable           No notes of EC Admission Criteria type on file.    PATIENT REFERRED TO:  No follow-up provider specified.    DISCHARGE MEDICATIONS:  Discharge Medication List as of 05/25/2024  1:45 AM             (Please note that portions of this note were completed with a voice recognition program.  Efforts were made to edit the dictations butoccasionally words are mis-transcribed.)    Malissa Slay E Pierce Jr, MD (electronically signed)  AttendingEmergency Physician  Lyle Norleen FORBES Mickey., MD  05/25/24 [phone]

## 2024-05-25 ENCOUNTER — Inpatient Hospital Stay
Admit: 2024-05-25 | Discharge: 2024-05-25 | Disposition: A | Discharge status: Home / Self Care | Payer: Medicare (Managed Care) | Arrived: AM | Attending: Emergency Medicine

## 2024-05-25 LAB — COMPREHENSIVE METABOLIC PANEL
ALT: 29 U/L (ref 10–50)
AST: 23 U/L (ref 10–50)
Albumin: 3.4 g/dL — ABNORMAL LOW (ref 3.5–5.2)
Alkaline Phosphatase: 94 U/L (ref 40–129)
Anion Gap: 13 mmol/L (ref 8–16)
BUN: 10 mg/dL (ref 8–23)
CO2: 22 mmol/L (ref 22–29)
Calcium: 8.5 mg/dL — ABNORMAL LOW (ref 8.8–10.2)
Chloride: 104 mmol/L (ref 98–107)
Creatinine: 0.8 mg/dL (ref 0.7–1.2)
Est, Glom Filt Rate: >90 (ref >60)
Glucose: 152 mg/dL — ABNORMAL HIGH (ref 70–99)
Potassium: 3.7 mmol/L (ref 3.5–5.1)
Sodium: 139 mmol/L (ref 136–145)
Total Bilirubin: 0.2 mg/dL (ref 0.2–1.2)
Total Protein: 6 g/dL — ABNORMAL LOW (ref 6.4–8.3)

## 2024-05-25 LAB — EKG 12-LEAD
P Axis: 82 degrees
P-R Interval: 188 ms
Q-T Interval: 406 ms
QRS Duration: 102 ms
QTc Calculation (Bazett): 418 ms
T Axis: 54 degrees

## 2024-05-25 LAB — DRUG SCRN, BUPRENORPHINE
Amphetamine Screen, Ur: NEGATIVE (ref <500)
Barbiturate Screen, Ur: NEGATIVE (ref <200)
Benzodiazepine Screen, Urine: NEGATIVE (ref <150)
Buprenorphine Urine: NEGATIVE (ref <10)
Cannabinoid Scrn, Ur: NEGATIVE (ref <50)
Cocaine Metabolite Screen, Urine: NEGATIVE (ref <150)
FENTANYL SCREEN, URINE: NEGATIVE (ref <5)
Methadone Screen, Urine: NEGATIVE (ref <200)
Methamphetamine, Urine: NEGATIVE (ref <500)
Opiate Screen, Urine: NEGATIVE (ref <100)
Oxycodone Urine: NEGATIVE (ref <100)
Phencyclidine (PCP), Screen, Urine: NEGATIVE (ref <25)
Tricyclic Antidepressants, Urine: NEGATIVE (ref <300)

## 2024-05-25 LAB — CBC WITH AUTO DIFFERENTIAL
Basophils %: 1 % (ref 0.0–1.0)
Basophils Absolute: 0.1 K/uL (ref 0.00–0.20)
Eosinophils %: 4.8 % (ref 0.0–5.0)
Eosinophils Absolute: 0.3 K/uL (ref 0.00–0.60)
Hematocrit: 37.9 % — ABNORMAL LOW (ref 42.0–52.0)
Hemoglobin: 13 g/dL — ABNORMAL LOW (ref 14.0–18.0)
Immature Granulocytes #: 0 K/uL
Lymphocytes %: 29.4 % (ref 20.0–40.0)
Lymphocytes Absolute: 1.8 K/uL (ref 1.1–4.5)
MCH: 28.5 pg (ref 27.0–31.0)
MCHC: 34.3 g/dL (ref 33.0–37.0)
MCV: 83.1 fL (ref 80.0–94.0)
MPV: 9.8 fL (ref 9.4–12.4)
Monocytes %: 8.5 % (ref 0.0–10.0)
Monocytes Absolute: 0.5 K/uL (ref 0.00–0.90)
Neutrophils %: 56 % (ref 50.0–65.0)
Neutrophils Absolute: 3.5 K/uL (ref 1.5–7.5)
Platelets: 218 K/uL (ref 130–400)
RBC: 4.56 M/uL — ABNORMAL LOW (ref 4.70–6.10)
RDW: 13.2 % (ref 11.5–14.5)
WBC: 6.3 K/uL (ref 4.8–10.8)

## 2024-05-25 LAB — CULTURE, URINE: Urine Culture, Routine: >100000 — AB

## 2024-05-25 LAB — MICROSCOPIC URINALYSIS

## 2024-05-25 LAB — PROTIME-INR
INR: 1.05 (ref 0.88–1.18)
Protime: 13.7 s (ref 12.0–14.6)

## 2024-05-25 LAB — URINALYSIS
Bilirubin, Urine: NEGATIVE
Glucose, Ur: NEGATIVE mg/dL
Ketones, Urine: 40 mg/dL — AB
Nitrite, Urine: NEGATIVE
Protein, UA: 30 mg/dL — AB
Specific Gravity, UA: 1.013 (ref 1.005–1.030)
Urobilinogen, Urine: 1 U/dL (ref <2.0)
pH, Urine: 6.5 (ref 5.0–8.0)

## 2024-05-25 LAB — ETHANOL: Ethanol Lvl: <11 mg/dL (ref 0–11)

## 2024-05-25 LAB — MAGNESIUM: Magnesium: 1.6 mg/dL (ref 1.6–2.4)

## 2024-05-25 NOTE — ED Notes (Signed)
 Spoke with pt's nurse Dianna at [facility] and gave her an update about pt and that he will be returning back to them.

## 2024-05-27 NOTE — Telephone Encounter (Signed)
 Care Transitions Initial Follow Up Call    Patient: Raymond Skinner Patient DOB: 11/30/1955   MRN: [id number]  Reason for Admission: disorientation  Discharge Date: 05/25/24      Discharge department/facility: [facility]    Outreach made within 2 business days of discharge: Yes, left message - first attempt.    Scheduled appointment with PCP within 0-14 days    Follow Up  Future Appointments   Date Time Provider Department Center   10/06/2024  9:00 AM Joshua Dorothyann SAILOR, APRN [facility] DEP       Rosina Dage, MA
